# Patient Record
Sex: Male | Born: 1945 | Race: White | Hispanic: No | Marital: Single | Smoking: Current every day smoker
Health system: Southern US, Community
[De-identification: ages and names within clinical notes are randomized; demographics above are authoritative.]

## PROBLEM LIST (undated history)

## (undated) DIAGNOSIS — F419 Anxiety disorder, unspecified: Secondary | ICD-10-CM

## (undated) DIAGNOSIS — F1010 Alcohol abuse, uncomplicated: Secondary | ICD-10-CM

## (undated) DIAGNOSIS — R569 Unspecified convulsions: Secondary | ICD-10-CM

## (undated) DIAGNOSIS — C801 Malignant (primary) neoplasm, unspecified: Secondary | ICD-10-CM

## (undated) DIAGNOSIS — H269 Unspecified cataract: Secondary | ICD-10-CM

## (undated) DIAGNOSIS — N400 Enlarged prostate without lower urinary tract symptoms: Secondary | ICD-10-CM

## (undated) HISTORY — DX: Anxiety disorder, unspecified: F41.9

## (undated) HISTORY — DX: Unspecified cataract: H26.9

---

## 2005-09-17 ENCOUNTER — Ambulatory Visit: Payer: Self-pay | Admitting: *Deleted

## 2005-09-17 ENCOUNTER — Inpatient Hospital Stay (HOSPITAL_COMMUNITY): Admission: EM | Admit: 2005-09-17 | Discharge: 2005-09-21 | Payer: Self-pay | Admitting: *Deleted

## 2006-03-18 ENCOUNTER — Emergency Department (HOSPITAL_COMMUNITY): Admission: EM | Admit: 2006-03-18 | Discharge: 2006-03-18 | Payer: Self-pay | Admitting: Emergency Medicine

## 2006-04-06 ENCOUNTER — Ambulatory Visit (HOSPITAL_COMMUNITY): Payer: Self-pay | Admitting: Psychiatry

## 2006-05-07 ENCOUNTER — Inpatient Hospital Stay (HOSPITAL_COMMUNITY): Admission: EM | Admit: 2006-05-07 | Discharge: 2006-05-10 | Payer: Self-pay | Admitting: Emergency Medicine

## 2006-05-07 ENCOUNTER — Ambulatory Visit: Payer: Self-pay | Admitting: Internal Medicine

## 2006-05-18 ENCOUNTER — Ambulatory Visit (HOSPITAL_COMMUNITY): Payer: Self-pay | Admitting: Psychiatry

## 2006-06-02 ENCOUNTER — Ambulatory Visit: Payer: Self-pay | Admitting: Internal Medicine

## 2006-06-02 DIAGNOSIS — F17200 Nicotine dependence, unspecified, uncomplicated: Secondary | ICD-10-CM | POA: Insufficient documentation

## 2006-06-02 DIAGNOSIS — H5316 Psychophysical visual disturbances: Secondary | ICD-10-CM | POA: Insufficient documentation

## 2006-06-02 DIAGNOSIS — F411 Generalized anxiety disorder: Secondary | ICD-10-CM | POA: Insufficient documentation

## 2006-06-02 DIAGNOSIS — G478 Other sleep disorders: Secondary | ICD-10-CM | POA: Insufficient documentation

## 2006-06-02 DIAGNOSIS — F1011 Alcohol abuse, in remission: Secondary | ICD-10-CM | POA: Insufficient documentation

## 2006-06-19 ENCOUNTER — Ambulatory Visit (HOSPITAL_BASED_OUTPATIENT_CLINIC_OR_DEPARTMENT_OTHER): Admission: RE | Admit: 2006-06-19 | Discharge: 2006-06-19 | Payer: Self-pay | Admitting: Internal Medicine

## 2006-06-19 ENCOUNTER — Encounter: Payer: Self-pay | Admitting: Internal Medicine

## 2006-06-26 ENCOUNTER — Ambulatory Visit: Payer: Self-pay | Admitting: Internal Medicine

## 2010-04-21 NOTE — Assessment & Plan Note (Signed)
Summary: NEW HFU PER DR GOLDING/CFB   Vital Signs:  Patient Profile:   65 Years Old Male Height:     68 inches Weight:      147.9 pounds Temp:     98.4 degrees F oral Pulse rate:   88 / minute BP sitting:   136 / 80  (right arm) Cuff size:   regular  Pt. in pain?   no  Vitals Entered By: Theotis Barrio (June 02, 2006 2:22 PM)              Is Patient Diabetic? No Nutritional Status Normal  Does patient need assistance? Functional Status Self care Ambulation Normal   Visit Type:  Hospital FU  Chief Complaint:  hospital followup/.  History of Present Illness: 65 year old man presents to office today for hospital follow-up. He has a PMH significant for alcoholism, in recovery for 7 months. He was admitted after possible witnessed seizure activity. His son described jerking movements and an unarousable state in the early morning hours prior to admission. In the hospital, he was not post-ictal, he had no additional seizure activity, and a normal EEG. He is seen by Dr. Elmon Kirschner at [facility]. He attends 12 step meetings. he has no complaints other than anxiety.He continues to smoke heavily.  Prior Medications: RISPERDAL 0.5 MG TABS (RISPERIDONE) Take 1 tablet by mouth once a day at bedtime   Past Medical History:    Hx ETOH Detox    Hx Homelessness    Risk Factors:  Tobacco use:  current    Year started:  over 20 years ago    Cigarettes:  Yes -- 2 pack(s) per day Alcohol use:  no Exercise:  no Seatbelt use:  100 %    Physical Exam  General:     alert, well-developed, and well-nourished.   Head:     no abnormalities observed.   Lungs:     normal respiratory effort and normal breath sounds.   Heart:     normal rate, regular rhythm, no murmur, no gallop, and no rub.   Abdomen:     soft and non-tender.   Extremities:     No clubbing, cyanosis, edema, or deformity noted. Neurologic:     alert & oriented X3, cranial nerves II-XII intact, strength normal in all  extremities, and gait normal.   Psych:     good eye contact, not depressed appearing, but moderately anxious.      Impression & Recommendations:  Problem # 1:  PARASOMNIA (ICD-780.59) Mr. Gaulin unusual symptoms mimicing seizure activity occurred in the early morning hour when he was in between sleep and wake. This is suspicious for a parasomnia or abmormal neuromuscular activity associated with the sleep wake cycle. I will refer him for a sleep study and see him back in the office in one month to review results.    Future Orders: T-CBC No Diff ([id number]) ... 63/03/2006 T-Lipid Profile [id number]) ... 63/03/2006 T-General Health Panel (CBCD, CMP, TSH) ([id number]) ... 63/03/2006 Sleep Disorder Referral (Sleep Disorder) ... 06/19/2006   Problem # 2:  ANXIETY DISORDER, GENERALIZED (ICD-300.02) Mr. Rollo is currently on Risperadol prescribed by his psyciatrist at [facility] for a prior visual hallucination- it is unclear if this was related to his detox process or represents an underlying psychosis. I will make no changes to his medication regimen today. I will consult with Dr. Elmon Kirschner prior to Mr. Ledyard next visit regarding his anxiety disorder. He may benefit from SSRI therapy. I also question  a mixed disorder including depression by history. Mr. Overbeck has an excellent support system at home with his son and daughter in law helping him with his recovery. I met with his daughter-in-law today as well, who expressed understanding of our future care plan.   F/U in 1 month.  Future Orders: T-CBC No Diff ([id number]) ... 63/03/2006 T-Lipid Profile [id number]) ... 63/03/2006 T-General Health Panel (CBCD, CMP, TSH) ([id number]) ... 63/03/2006   Problem # 3:  TOBACCO ABUSE (ICD-305.1) Mr. Idrovo is pre-contemplation. He feels at this point hs sobriety is most important. He also thinks he relies on smoking to help with his anxiety. He does express understanding of the negative health effects  and would like to begin planning for cessation in the near future. Provided encouragement and support today.  Medications Added to Medication List This Visit: 1)  Risperdal 0.5 Mg Tabs (Risperidone) .... Take 1 tablet by mouth once a day at bedtime   Patient Instructions: 1)  F/U in 3-4 months 2)  Please return for lab work one (1) week before your next appointment.  3)  Stop Smoking Tips: Choose a Quit date. Cut down before the Quit date. decide what you will do as a substitute when you feel the urge to smoke(gum,toothpick,exercise). 4)  Take an Aspirin every day.

## 2010-04-21 NOTE — Consult Note (Signed)
Summary: Sleep Study-Dr. Maple Hudson  Sleep Study-Dr. Maple Hudson   Imported By: Dorice Lamas 08/02/2006 13:47:22  _____________________________________________________________________  External Attachment:    Type:   Image     Comment:   External Document

## 2018-03-22 HISTORY — PX: CATARACT EXTRACTION, BILATERAL: SHX1313

## 2019-06-30 ENCOUNTER — Emergency Department (HOSPITAL_COMMUNITY)
Admission: EM | Admit: 2019-06-30 | Discharge: 2019-06-30 | Disposition: A | Discharge status: Home / Self Care | Payer: Medicare Other | Attending: Emergency Medicine | Admitting: Emergency Medicine

## 2019-06-30 ENCOUNTER — Other Ambulatory Visit: Payer: Self-pay

## 2019-06-30 ENCOUNTER — Encounter (HOSPITAL_COMMUNITY): Payer: Self-pay | Admitting: Emergency Medicine

## 2019-06-30 ENCOUNTER — Emergency Department (HOSPITAL_COMMUNITY): Payer: Medicare Other

## 2019-06-30 DIAGNOSIS — D376 Neoplasm of uncertain behavior of liver, gallbladder and bile ducts: Secondary | ICD-10-CM | POA: Insufficient documentation

## 2019-06-30 DIAGNOSIS — R339 Retention of urine, unspecified: Secondary | ICD-10-CM | POA: Diagnosis not present

## 2019-06-30 DIAGNOSIS — F17200 Nicotine dependence, unspecified, uncomplicated: Secondary | ICD-10-CM | POA: Diagnosis not present

## 2019-06-30 DIAGNOSIS — K5900 Constipation, unspecified: Secondary | ICD-10-CM | POA: Diagnosis present

## 2019-06-30 DIAGNOSIS — R160 Hepatomegaly, not elsewhere classified: Secondary | ICD-10-CM

## 2019-06-30 DIAGNOSIS — E876 Hypokalemia: Secondary | ICD-10-CM | POA: Diagnosis not present

## 2019-06-30 HISTORY — DX: Unspecified convulsions: R56.9

## 2019-06-30 LAB — URINALYSIS, ROUTINE W REFLEX MICROSCOPIC
Bilirubin Urine: NEGATIVE
Glucose, UA: NEGATIVE mg/dL
Hgb urine dipstick: NEGATIVE
Ketones, ur: NEGATIVE mg/dL
Leukocytes,Ua: NEGATIVE
Nitrite: NEGATIVE
Protein, ur: NEGATIVE mg/dL
Specific Gravity, Urine: 1.012 (ref 1.005–1.030)
pH: 6 (ref 5.0–8.0)

## 2019-06-30 LAB — COMPREHENSIVE METABOLIC PANEL
ALT: 15 U/L (ref 0–44)
AST: 19 U/L (ref 15–41)
Albumin: 3.5 g/dL (ref 3.5–5.0)
Alkaline Phosphatase: 42 U/L (ref 38–126)
Anion gap: 11 (ref 5–15)
BUN: 13 mg/dL (ref 8–23)
CO2: 27 mmol/L (ref 22–32)
Calcium: 9.1 mg/dL (ref 8.9–10.3)
Chloride: 104 mmol/L (ref 98–111)
Creatinine, Ser: 1.15 mg/dL (ref 0.61–1.24)
GFR calc Af Amer: >60 mL/min (ref >60)
GFR calc non Af Amer: >60 mL/min (ref >60)
Glucose, Bld: 102 mg/dL — ABNORMAL HIGH (ref 70–99)
Potassium: 2.6 mmol/L — CL (ref 3.5–5.1)
Sodium: 142 mmol/L (ref 135–145)
Total Bilirubin: 0.9 mg/dL (ref 0.3–1.2)
Total Protein: 6.2 g/dL — ABNORMAL LOW (ref 6.5–8.1)

## 2019-06-30 LAB — CBC
HCT: 39.7 % (ref 39.0–52.0)
Hemoglobin: 13.3 g/dL (ref 13.0–17.0)
MCH: 30.7 pg (ref 26.0–34.0)
MCHC: 33.5 g/dL (ref 30.0–36.0)
MCV: 91.7 fL (ref 80.0–100.0)
Platelets: 368 10*3/uL (ref 150–400)
RBC: 4.33 MIL/uL (ref 4.22–5.81)
RDW: 13.8 % (ref 11.5–15.5)
WBC: 11.6 10*3/uL — ABNORMAL HIGH (ref 4.0–10.5)
nRBC: 0 % (ref 0.0–0.2)

## 2019-06-30 LAB — LIPASE, BLOOD: Lipase: 22 U/L (ref 11–51)

## 2019-06-30 MED ORDER — SODIUM CHLORIDE 0.9% FLUSH: 3.0000 mL | Freq: Once | INTRAVENOUS | Status: DC

## 2019-06-30 MED ORDER — SODIUM CHLORIDE 0.9 % IV SOLN
INTRAVENOUS | Status: DC
Start: 2019-06-30 — End: 2019-07-01

## 2019-06-30 MED ORDER — IOHEXOL 300 MG/ML  SOLN
100.0000 mL | Freq: Once | INTRAMUSCULAR | Status: AC | PRN
  Administered 2019-06-30: 20:00:00 100 mL via INTRAVENOUS

## 2019-06-30 MED ORDER — POTASSIUM CHLORIDE CRYS ER 20 MEQ PO TBCR
60.0000 meq | EXTENDED_RELEASE_TABLET | Freq: Once | ORAL | Status: AC
  Administered 2019-06-30: 19:00:00 60 meq via ORAL
  Filled 2019-06-30: qty 3

## 2019-06-30 MED ORDER — MORPHINE SULFATE (PF) 4 MG/ML IV SOLN
4.0000 mg | Freq: Once | INTRAVENOUS | Status: DC
  Filled 2019-06-30: qty 1

## 2019-06-30 MED ORDER — POTASSIUM CHLORIDE 10 MEQ/100ML IV SOLN
10.0000 meq | Freq: Once | INTRAVENOUS | Status: AC
  Administered 2019-06-30: 10 meq via INTRAVENOUS
  Filled 2019-06-30: qty 100

## 2019-06-30 NOTE — ED Notes (Signed)
Potassium 2.6 

## 2019-06-30 NOTE — ED Triage Notes (Signed)
C/o constipation x 1 month with rectal pain and urinary incontinence.

## 2019-06-30 NOTE — ED Provider Notes (Signed)
[facility] EMERGENCY DEPARTMENT Provider Note   CSN: [id number] Arrival date & time: 06/30/19  1735     History Chief Complaint  Patient presents with  . Constipation    Joyner Folk is a 74 y.o. male.  74 year old male presents with 1 month history of constipation.  Has been using over-the-counter medications with limited relief.  States that he has had some urinary incontinence but denies any retention.  No back or abdominal discomfort.  No fever or chills.  No emesis noted.  Feels as if he has a mass in his rectum.  Nothing makes his symptoms better        Past Medical History:  Diagnosis Date  . Seizures [facility])     Patient Active Problem List   Diagnosis Date Noted  . ANXIETY DISORDER, GENERALIZED 06/02/2006  . ABUSE, ALCOHOL, IN REMISSION 06/02/2006  . TOBACCO ABUSE 06/02/2006  . VISUAL HALLUCINATION 06/02/2006  . PARASOMNIA 06/02/2006    History reviewed. No pertinent surgical history.     No family history on file.  Social History   Tobacco Use  . Smoking status: Current Every Day Smoker  . Smokeless tobacco: Never Used  Substance Use Topics  . Alcohol use: Not Currently  . Drug use: Never    Home Medications Prior to Admission medications   Not on File    Allergies    Patient has no allergy information on record.  Review of Systems   Review of Systems  All other systems reviewed and are negative.   Physical Exam Updated Vital Signs BP (!) 146/93 (BP Location: Left Arm)   Pulse (!) 102   Temp 98.7 F (37.1 C) (Oral)   Resp 16   Ht 1.727 m (5\' 8" )   SpO2 94%   Physical Exam Vitals and nursing note reviewed.  Constitutional:      General: He is not in acute distress.    Appearance: Normal appearance. He is well-developed. He is not toxic-appearing.  HENT:     Head: Normocephalic and atraumatic.  Eyes:     General: Lids are normal.     Conjunctiva/sclera: Conjunctivae normal.     Pupils: Pupils are equal,  round, and reactive to light.  Neck:     Thyroid: No thyroid mass.     Trachea: No tracheal deviation.  Cardiovascular:     Rate and Rhythm: Normal rate and regular rhythm.     Heart sounds: Normal heart sounds. No murmur. No gallop.   Pulmonary:     Effort: Pulmonary effort is normal. No respiratory distress.     Breath sounds: Normal breath sounds. No stridor. No decreased breath sounds, wheezing, rhonchi or rales.  Abdominal:     General: Bowel sounds are normal. There is no distension.     Palpations: Abdomen is soft.     Tenderness: There is no abdominal tenderness. There is no rebound.  Genitourinary:    Comments: Stool ball noted in rectal vault Musculoskeletal:        General: No tenderness. Normal range of motion.     Cervical back: Normal range of motion and neck supple.  Skin:    General: Skin is warm and dry.     Findings: No abrasion or rash.  Neurological:     Mental Status: He is alert and oriented to person, place, and time.     GCS: GCS eye subscore is 4. GCS verbal subscore is 5. GCS motor subscore is 6.  Cranial Nerves: No cranial nerve deficit.     Sensory: No sensory deficit.  Psychiatric:        Speech: Speech normal.        Behavior: Behavior normal.     ED Results / Procedures / Treatments   Labs (all labs ordered are listed, but only abnormal results are displayed) Labs Reviewed  CBC - Abnormal; Notable for the following components:      Result Value   WBC 11.6 (*)    All other components within normal limits  LIPASE, BLOOD  COMPREHENSIVE METABOLIC PANEL  URINALYSIS, ROUTINE W REFLEX MICROSCOPIC    EKG None  Radiology No results found.  Procedures Procedures (including critical care time)  Medications Ordered in ED Medications  sodium chloride flush (NS) 0.9 % injection 3 mL (has no administration in time range)    ED Course  I have reviewed the triage vital signs and the nursing notes.  Pertinent labs & imaging results that  were available during my care of the patient were reviewed by me and considered in my medical decision making (see chart for details).    MDM Rules/Calculators/A&P                      Patient disimpacted manually here.  Good stool return.  Found to be hypokalemic and likely from his diarrhea and his potassium was replenished oral as well as IV.  Urinalysis negative for infection.  Renal function normal.  Abdominal CT results noted and reviewed with the patient.  Will have nursing place Foley catheter and give patient referral to urology.  Liver mass discussed in detail with patient and his daughter.  Will refer to GI for MRCP.  Patient's primary care doctor is in [place] at [facility] and I have instructed them to call their physician on Monday to schedule a follow-up visit.  Patient also need to have a repeat potassium next week.  Return precautions given   CRITICAL CARE Performed by: Leota Jacobsen Total critical care time: 40 minutes Critical care time was exclusive of separately billable procedures and treating other patients. Critical care was necessary to treat or prevent imminent or life-threatening deterioration. Critical care was time spent personally by me on the following activities: development of treatment plan with patient and/or surrogate as well as nursing, discussions with consultants, evaluation of patient's response to treatment, examination of patient, obtaining history from patient or surrogate, ordering and performing treatments and interventions, ordering and review of laboratory studies, ordering and review of radiographic studies, pulse oximetry and re-evaluation of patient's condition.  Final Clinical Impression(s) / ED Diagnoses Final diagnoses:  None    Rx / DC Orders ED Discharge Orders    None       Lacretia Leigh, MD 06/30/19 2026

## 2019-06-30 NOTE — Discharge Instructions (Addendum)
You have a mass on your liver which is highly suspicious for cancer.  Call your primary care doctor to schedule a follow-up visit with this at [facility] or call the gastroenterologist that I referred you to.  You have also been referred to urologist due to urinary retention.  You may call the one that I referred you to or also have your doctor coordinate a visit at [facility].  Use a fleets enema as directed.  Return here for vomiting.  Your potassium was low here and needs to be repeated next week.

## 2019-06-30 NOTE — ED Notes (Signed)
Pink tinged bloody urine noted in foley catheter line, blood in catheter bag amber.  Dr. Karle Starch made aware.  Advised pt and daughter to check patency of foley frequently.  If appears to be blocked or large clots return to ED.

## 2019-08-17 ENCOUNTER — Inpatient Hospital Stay (HOSPITAL_COMMUNITY)
Admission: EM | Admit: 2019-08-17 | Discharge: 2019-08-22 | DRG: 871 | Disposition: A | Discharge status: Skilled Nursing Facility | Payer: Medicare Other | Attending: Internal Medicine | Admitting: Internal Medicine

## 2019-08-17 ENCOUNTER — Emergency Department (HOSPITAL_COMMUNITY): Payer: Medicare Other

## 2019-08-17 ENCOUNTER — Encounter (HOSPITAL_COMMUNITY): Payer: Self-pay | Admitting: Obstetrics and Gynecology

## 2019-08-17 ENCOUNTER — Other Ambulatory Visit: Payer: Self-pay

## 2019-08-17 DIAGNOSIS — N3289 Other specified disorders of bladder: Secondary | ICD-10-CM | POA: Diagnosis present

## 2019-08-17 DIAGNOSIS — R5381 Other malaise: Secondary | ICD-10-CM | POA: Diagnosis present

## 2019-08-17 DIAGNOSIS — D649 Anemia, unspecified: Secondary | ICD-10-CM | POA: Diagnosis present

## 2019-08-17 DIAGNOSIS — R338 Other retention of urine: Secondary | ICD-10-CM

## 2019-08-17 DIAGNOSIS — Z9119 Patient's noncompliance with other medical treatment and regimen: Secondary | ICD-10-CM | POA: Diagnosis not present

## 2019-08-17 DIAGNOSIS — F17200 Nicotine dependence, unspecified, uncomplicated: Secondary | ICD-10-CM | POA: Diagnosis present

## 2019-08-17 DIAGNOSIS — R339 Retention of urine, unspecified: Secondary | ICD-10-CM | POA: Diagnosis not present

## 2019-08-17 DIAGNOSIS — R932 Abnormal findings on diagnostic imaging of liver and biliary tract: Secondary | ICD-10-CM | POA: Diagnosis not present

## 2019-08-17 DIAGNOSIS — N179 Acute kidney failure, unspecified: Secondary | ICD-10-CM

## 2019-08-17 DIAGNOSIS — R0682 Tachypnea, not elsewhere classified: Secondary | ICD-10-CM | POA: Diagnosis present

## 2019-08-17 DIAGNOSIS — N136 Pyonephrosis: Secondary | ICD-10-CM | POA: Diagnosis present

## 2019-08-17 DIAGNOSIS — R195 Other fecal abnormalities: Secondary | ICD-10-CM | POA: Diagnosis present

## 2019-08-17 DIAGNOSIS — B961 Klebsiella pneumoniae [K. pneumoniae] as the cause of diseases classified elsewhere: Secondary | ICD-10-CM | POA: Diagnosis present

## 2019-08-17 DIAGNOSIS — G40909 Epilepsy, unspecified, not intractable, without status epilepticus: Secondary | ICD-10-CM | POA: Diagnosis present

## 2019-08-17 DIAGNOSIS — Z87440 Personal history of urinary (tract) infections: Secondary | ICD-10-CM

## 2019-08-17 DIAGNOSIS — J90 Pleural effusion, not elsewhere classified: Secondary | ICD-10-CM | POA: Diagnosis present

## 2019-08-17 DIAGNOSIS — Z79899 Other long term (current) drug therapy: Secondary | ICD-10-CM

## 2019-08-17 DIAGNOSIS — R160 Hepatomegaly, not elsewhere classified: Secondary | ICD-10-CM

## 2019-08-17 DIAGNOSIS — Z20822 Contact with and (suspected) exposure to covid-19: Secondary | ICD-10-CM | POA: Diagnosis present

## 2019-08-17 DIAGNOSIS — R9431 Abnormal electrocardiogram [ECG] [EKG]: Secondary | ICD-10-CM | POA: Diagnosis present

## 2019-08-17 DIAGNOSIS — C221 Intrahepatic bile duct carcinoma: Secondary | ICD-10-CM | POA: Diagnosis present

## 2019-08-17 DIAGNOSIS — I959 Hypotension, unspecified: Secondary | ICD-10-CM | POA: Diagnosis present

## 2019-08-17 DIAGNOSIS — N390 Urinary tract infection, site not specified: Secondary | ICD-10-CM

## 2019-08-17 DIAGNOSIS — Z791 Long term (current) use of non-steroidal anti-inflammatories (NSAID): Secondary | ICD-10-CM | POA: Diagnosis not present

## 2019-08-17 DIAGNOSIS — G9341 Metabolic encephalopathy: Secondary | ICD-10-CM | POA: Diagnosis present

## 2019-08-17 DIAGNOSIS — R188 Other ascites: Secondary | ICD-10-CM | POA: Diagnosis present

## 2019-08-17 DIAGNOSIS — A419 Sepsis, unspecified organism: Secondary | ICD-10-CM | POA: Diagnosis present

## 2019-08-17 DIAGNOSIS — Z602 Problems related to living alone: Secondary | ICD-10-CM | POA: Diagnosis not present

## 2019-08-17 LAB — MAGNESIUM: Magnesium: 1.9 mg/dL (ref 1.7–2.4)

## 2019-08-17 LAB — COMPREHENSIVE METABOLIC PANEL
ALT: 13 U/L (ref 0–44)
AST: 11 U/L — ABNORMAL LOW (ref 15–41)
Albumin: 2.1 g/dL — ABNORMAL LOW (ref 3.5–5.0)
Alkaline Phosphatase: 61 U/L (ref 38–126)
Anion gap: 9 (ref 5–15)
BUN: 45 mg/dL — ABNORMAL HIGH (ref 8–23)
CO2: 19 mmol/L — ABNORMAL LOW (ref 22–32)
Calcium: 7.5 mg/dL — ABNORMAL LOW (ref 8.9–10.3)
Chloride: 108 mmol/L (ref 98–111)
Creatinine, Ser: 2.44 mg/dL — ABNORMAL HIGH (ref 0.61–1.24)
GFR calc Af Amer: 30 mL/min — ABNORMAL LOW (ref >60)
GFR calc non Af Amer: 25 mL/min — ABNORMAL LOW (ref >60)
Glucose, Bld: 123 mg/dL — ABNORMAL HIGH (ref 70–99)
Potassium: 3.9 mmol/L (ref 3.5–5.1)
Sodium: 136 mmol/L (ref 135–145)
Total Bilirubin: 0.3 mg/dL (ref 0.3–1.2)
Total Protein: 5.7 g/dL — ABNORMAL LOW (ref 6.5–8.1)

## 2019-08-17 LAB — CBC WITH DIFFERENTIAL/PLATELET
Abs Immature Granulocytes: 0.17 10*3/uL — ABNORMAL HIGH (ref 0.00–0.07)
Basophils Absolute: 0.1 10*3/uL (ref 0.0–0.1)
Basophils Relative: 0 %
Eosinophils Absolute: 0.1 10*3/uL (ref 0.0–0.5)
Eosinophils Relative: 0 %
HCT: 25.6 % — ABNORMAL LOW (ref 39.0–52.0)
Hemoglobin: 8.2 g/dL — ABNORMAL LOW (ref 13.0–17.0)
Immature Granulocytes: 1 %
Lymphocytes Relative: 8 %
Lymphs Abs: 1.6 10*3/uL (ref 0.7–4.0)
MCH: 29.3 pg (ref 26.0–34.0)
MCHC: 32 g/dL (ref 30.0–36.0)
MCV: 91.4 fL (ref 80.0–100.0)
Monocytes Absolute: 1.4 10*3/uL — ABNORMAL HIGH (ref 0.1–1.0)
Monocytes Relative: 7 %
Neutro Abs: 16.9 10*3/uL — ABNORMAL HIGH (ref 1.7–7.7)
Neutrophils Relative %: 84 %
Platelets: 761 10*3/uL — ABNORMAL HIGH (ref 150–400)
RBC: 2.8 MIL/uL — ABNORMAL LOW (ref 4.22–5.81)
RDW: 15.8 % — ABNORMAL HIGH (ref 11.5–15.5)
WBC: 20.3 10*3/uL — ABNORMAL HIGH (ref 4.0–10.5)
nRBC: 0 % (ref 0.0–0.2)

## 2019-08-17 LAB — URINALYSIS, MICROSCOPIC (REFLEX)
RBC / HPF: NONE SEEN RBC/hpf (ref 0–5)
Squamous Epithelial / HPF: NONE SEEN (ref 0–5)
WBC, UA: >50 WBC/hpf (ref 0–5)

## 2019-08-17 LAB — SARS CORONAVIRUS 2 BY RT PCR (HOSPITAL ORDER, PERFORMED IN ~~LOC~~ HOSPITAL LAB): SARS Coronavirus 2: NEGATIVE

## 2019-08-17 LAB — URINALYSIS, ROUTINE W REFLEX MICROSCOPIC
Specific Gravity, Urine: 1.02 (ref 1.005–1.030)
pH: 6 (ref 5.0–8.0)

## 2019-08-17 LAB — APTT: aPTT: 33 seconds (ref 24–36)

## 2019-08-17 LAB — LACTIC ACID, PLASMA: Lactic Acid, Venous: 1.3 mmol/L (ref 0.5–1.9)

## 2019-08-17 LAB — POC OCCULT BLOOD, ED: Fecal Occult Bld: NEGATIVE

## 2019-08-17 LAB — PROTIME-INR
INR: 1.3 — ABNORMAL HIGH (ref 0.8–1.2)
Prothrombin Time: 16.1 seconds — ABNORMAL HIGH (ref 11.4–15.2)

## 2019-08-17 LAB — TSH: TSH: 1.92 u[IU]/mL (ref 0.350–4.500)

## 2019-08-17 MED ORDER — SODIUM CHLORIDE 0.9 % IV SOLN
1.0000 g | Freq: Once | INTRAVENOUS | Status: AC
  Administered 2019-08-17: 1 g via INTRAVENOUS
  Filled 2019-08-17: qty 10

## 2019-08-17 MED ORDER — POLYETHYLENE GLYCOL 3350 17 G PO PACK: 17.0000 g | PACK | Freq: Every day | ORAL | Status: DC | PRN

## 2019-08-17 MED ORDER — OXYCODONE HCL 5 MG PO TABS: 5.0000 mg | ORAL_TABLET | ORAL | Status: DC | PRN

## 2019-08-17 MED ORDER — MAGNESIUM SULFATE 2 GM/50ML IV SOLN: 2.0000 g | Freq: Once | INTRAVENOUS | Status: DC

## 2019-08-17 MED ORDER — SODIUM CHLORIDE 0.9 % IV BOLUS
30.0000 mL/kg | Freq: Once | INTRAVENOUS | Status: AC
  Administered 2019-08-17: 2028 mL via INTRAVENOUS

## 2019-08-17 MED ORDER — SODIUM CHLORIDE 0.9 % IV SOLN: INTRAVENOUS | Status: DC

## 2019-08-17 MED ORDER — SODIUM CHLORIDE 0.9 % IV SOLN
2.0000 g | INTRAVENOUS | Status: DC
  Administered 2019-08-17 – 2019-08-18 (×2): 2 g via INTRAVENOUS
  Filled 2019-08-17 (×3): qty 2

## 2019-08-17 MED ORDER — TAMSULOSIN HCL 0.4 MG PO CAPS
0.4000 mg | ORAL_CAPSULE | Freq: Every day | ORAL | Status: DC
  Administered 2019-08-17 – 2019-08-22 (×6): 0.4 mg via ORAL
  Filled 2019-08-17 (×6): qty 1

## 2019-08-17 MED ORDER — MAGNESIUM SULFATE IN D5W 1-5 GM/100ML-% IV SOLN
1.0000 g | Freq: Once | INTRAVENOUS | Status: AC
  Administered 2019-08-17: 1 g via INTRAVENOUS
  Filled 2019-08-17: qty 100

## 2019-08-17 MED ORDER — OXCARBAZEPINE 300 MG/5ML PO SUSP
600.0000 mg | Freq: Every day | ORAL | Status: DC
  Administered 2019-08-17 – 2019-08-21 (×5): 600 mg via ORAL
  Filled 2019-08-17 (×6): qty 10

## 2019-08-17 MED ORDER — ACETAMINOPHEN 650 MG RE SUPP: 650.0000 mg | Freq: Four times a day (QID) | RECTAL | Status: DC | PRN

## 2019-08-17 MED ORDER — OXCARBAZEPINE 300 MG/5ML PO SUSP
300.0000 mg | Freq: Every day | ORAL | Status: DC
  Administered 2019-08-18 – 2019-08-22 (×5): 300 mg via ORAL
  Filled 2019-08-17 (×5): qty 5

## 2019-08-17 MED ORDER — ONDANSETRON HCL 4 MG/2ML IJ SOLN: 4.0000 mg | Freq: Four times a day (QID) | INTRAMUSCULAR | Status: DC | PRN

## 2019-08-17 MED ORDER — HEPARIN SODIUM (PORCINE) 5000 UNIT/ML IJ SOLN
5000.0000 [IU] | Freq: Three times a day (TID) | INTRAMUSCULAR | Status: DC
  Administered 2019-08-17: 5000 [IU] via SUBCUTANEOUS
  Filled 2019-08-17: qty 1

## 2019-08-17 MED ORDER — ACETAMINOPHEN 325 MG PO TABS: 650.0000 mg | ORAL_TABLET | Freq: Four times a day (QID) | ORAL | Status: DC | PRN

## 2019-08-17 MED ORDER — ONDANSETRON HCL 4 MG PO TABS: 4.0000 mg | ORAL_TABLET | Freq: Four times a day (QID) | ORAL | Status: DC | PRN

## 2019-08-17 NOTE — ED Notes (Signed)
ED TO INPATIENT HANDOFF REPORT  ED Nurse Name and Phone #: jon wled   S Name/Age/Gender Mason Blackburn 74 y.o. male Room/Bed: WA01/WA01  Code Status   Code Status: Full Code  Home/SNF/Other Home Patient oriented to: self and situation Is this baseline? Yes   Triage Complete: Triage complete  Chief Complaint Sepsis [facility]) [A41.9]  Triage Note Pt BIB EMS from UC. Pt reports weakness and UTI symptoms. Pt had a foley catheter that has since been removed. A&O x4. Pale and diaphoretic.  64/30 Low 90s SpO2 HR 80-90  CBG 82 18G RAC 1044mL NS    Allergies No Known Allergies  Level of Care/Admitting Diagnosis ED Disposition    ED Disposition Condition Comment   Admit  Hospital Area: [facility] [id number]  Level of Care: Telemetry [5]  Admit to tele based on following criteria: Monitor QTC interval  Admit to tele based on following criteria: Monitor for Ischemic changes  May admit patient to [facility] or [facility] if equivalent level of care is available:: Yes  Covid Evaluation: Confirmed COVID Negative  [facility]: [id number]  Admitting Physician: Guilford Shi [id number]  Attending Physician: Guilford Shi [id number]  Estimated length of stay: 3 - 4 days  Certification:: I certify this patient will need inpatient services for at least 2 midnights       B Medical/Surgery History Past Medical History:  Diagnosis Date  . Seizures ([place])    History reviewed. No pertinent surgical history.   A IV Location/Drains/Wounds Patient Lines/Drains/Airways Status   Active Line/Drains/Airways    Name:   Placement date:   Placement time:   Site:   Days:   Peripheral IV 08/17/19 Left Forearm   08/17/19    1333    Forearm   less than 1   Peripheral IV 08/17/19 Right Antecubital   08/17/19    --    Antecubital   less than 1          Intake/Output Last 24 hours  Intake/Output Summary (Last 24 hours) at 08/17/2019 1924 Last data filed  at 08/17/2019 [id number] Gross per 24 hour  Intake 2328 ml  Output --  Net 2328 ml    Labs/Imaging Results for orders placed or performed during the hospital encounter of 08/17/19 (from the past 48 hour(s))  Lactic acid, plasma     Status: None   Collection Time: 08/17/19  1:23 PM  Result Value Ref Range   Lactic Acid, Venous 1.3 0.5 - 1.9 mmol/L    Comment: Performed at [facility], [place] [address] [place], [place] [address]  Urinalysis, Routine w reflex microscopic     Status: Abnormal   Collection Time: 08/17/19  1:23 PM  Result Value Ref Range   Color, Urine YELLOW YELLOW   APPearance TURBID (A) CLEAR   Specific Gravity, Urine 1.020 1.005 - 1.030   pH 6.0 5.0 - 8.0   Glucose, UA (A) NEGATIVE mg/dL    TEST NOT REPORTED DUE TO COLOR INTERFERENCE OF URINE PIGMENT   Hgb urine dipstick (A) NEGATIVE    TEST NOT REPORTED DUE TO COLOR INTERFERENCE OF URINE PIGMENT   Bilirubin Urine (A) NEGATIVE    TEST NOT REPORTED DUE TO COLOR INTERFERENCE OF URINE PIGMENT   Ketones, ur (A) NEGATIVE mg/dL    TEST NOT REPORTED DUE TO COLOR INTERFERENCE OF URINE PIGMENT   Protein, ur (A) NEGATIVE mg/dL    TEST NOT REPORTED DUE TO COLOR INTERFERENCE OF URINE PIGMENT  Nitrite (A) NEGATIVE    TEST NOT REPORTED DUE TO COLOR INTERFERENCE OF URINE PIGMENT   Leukocytes,Ua (A) NEGATIVE    TEST NOT REPORTED DUE TO COLOR INTERFERENCE OF URINE PIGMENT    Comment: Performed at [facility], [place] [address], [place], [place] [address]  Urinalysis, Microscopic (reflex)     Status: Abnormal   Collection Time: 08/17/19  1:23 PM  Result Value Ref Range   RBC / HPF NONE SEEN 0 - 5 RBC/hpf   WBC, UA >50 0 - 5 WBC/hpf   Bacteria, UA MANY (A) NONE SEEN   Squamous Epithelial / LPF NONE SEEN 0 - 5    Comment: Performed at [facility], [place] [address] [place], [place] [address]  SARS Coronavirus 2 by RT PCR (hospital order, performed in [facility] hospital lab)  Nasopharyngeal Nasopharyngeal Swab     Status: None   Collection Time: 08/17/19  1:28 PM   Specimen: Nasopharyngeal Swab  Result Value Ref Range   SARS Coronavirus 2 NEGATIVE NEGATIVE    Comment: (NOTE) SARS-CoV-2 target nucleic acids are NOT DETECTED. The SARS-CoV-2 RNA is generally detectable in upper and lower respiratory specimens during the acute phase of infection. The lowest concentration of SARS-CoV-2 viral copies this assay can detect is 250 copies / mL. A negative result does not preclude SARS-CoV-2 infection and should not be used as the sole basis for treatment or other patient management decisions.  A negative result may occur with improper specimen collection / handling, submission of specimen other than nasopharyngeal swab, presence of viral mutation(s) within the areas targeted by this assay, and inadequate number of viral copies (<250 copies / mL). A negative result must be combined with clinical observations, patient history, and epidemiological information. Fact Sheet for Patients:   StrictlyIdeas.no Fact Sheet for Healthcare Providers: BankingDealers.co.za This test is not yet approved or cleared  by the [place] FDA and has been authorized for detection and/or diagnosis of SARS-CoV-2 by FDA under an Emergency Use Authorization (EUA).  This EUA will remain in effect (meaning this test can be used) for the duration of the COVID-19 declaration under Section 564(b)(1) of the Act, 21 U.S.C. section 360bbb-3(b)(1), unless the authorization is terminated or revoked sooner. Performed at [facility], [place] [address], [place], [place] [address]   Comprehensive metabolic panel     Status: Abnormal   Collection Time: 08/17/19  1:39 PM  Result Value Ref Range   Sodium 136 135 - 145 mmol/L   Potassium 3.9 3.5 - 5.1 mmol/L   Chloride 108 98 - 111 mmol/L   CO2 19 (L) 22 - 32 mmol/L   Glucose, Bld 123 (H)  70 - 99 mg/dL    Comment: Glucose reference range applies only to samples taken after fasting for at least 8 hours.   BUN 45 (H) 8 - 23 mg/dL   Creatinine, Ser 2.44 (H) 0.61 - 1.24 mg/dL   Calcium 7.5 (L) 8.9 - 10.3 mg/dL   Total Protein 5.7 (L) 6.5 - 8.1 g/dL   Albumin 2.1 (L) 3.5 - 5.0 g/dL   AST 11 (L) 15 - 41 U/L   ALT 13 0 - 44 U/L   Alkaline Phosphatase 61 38 - 126 U/L   Total Bilirubin 0.3 0.3 - 1.2 mg/dL   GFR calc non Af Amer 25 (L) >60 mL/min   GFR calc Af Amer 30 (L) >60 mL/min   Anion gap 9 5 - 15  Comment: Performed at [facility], [place] [address], [place], [place] [address]  CBC with Differential     Status: Abnormal   Collection Time: 08/17/19  1:39 PM  Result Value Ref Range   WBC 20.3 (H) 4.0 - 10.5 K/uL   RBC 2.80 (L) 4.22 - 5.81 MIL/uL   Hemoglobin 8.2 (L) 13.0 - 17.0 g/dL   HCT 25.6 (L) 39.0 - 52.0 %   MCV 91.4 80.0 - 100.0 fL   MCH 29.3 26.0 - 34.0 pg   MCHC 32.0 30.0 - 36.0 g/dL   RDW 15.8 (H) 11.5 - 15.5 %   Platelets 761 (H) 150 - 400 K/uL   nRBC 0.0 0.0 - 0.2 %   Neutrophils Relative % 84 %   Neutro Abs 16.9 (H) 1.7 - 7.7 K/uL   Lymphocytes Relative 8 %   Lymphs Abs 1.6 0.7 - 4.0 K/uL   Monocytes Relative 7 %   Monocytes Absolute 1.4 (H) 0.1 - 1.0 K/uL   Eosinophils Relative 0 %   Eosinophils Absolute 0.1 0.0 - 0.5 K/uL   Basophils Relative 0 %   Basophils Absolute 0.1 0.0 - 0.1 K/uL   Immature Granulocytes 1 %   Abs Immature Granulocytes 0.17 (H) 0.00 - 0.07 K/uL    Comment: Performed at [facility], [place] [address], [place], [place] [address]  Protime-INR     Status: Abnormal   Collection Time: 08/17/19  1:39 PM  Result Value Ref Range   Prothrombin Time 16.1 (H) 11.4 - 15.2 seconds   INR 1.3 (H) 0.8 - 1.2    Comment: (NOTE) INR goal varies based on device and disease states. Performed at [facility], [place] [address], [place], [place] [address]   APTT     Status: None   Collection  Time: 08/17/19  1:39 PM  Result Value Ref Range   aPTT 33 24 - 36 seconds    Comment: Performed at [facility], [place] [address] [place], [place] [address]  TSH     Status: None   Collection Time: 08/17/19  1:39 PM  Result Value Ref Range   TSH 1.920 0.350 - 4.500 uIU/mL    Comment: Performed by a 3rd Generation assay with a functional sensitivity of <=0.01 uIU/mL. Performed at [facility], [place] [address] [place], [place] [address]   POC occult blood, ED Provider will collect     Status: None   Collection Time: 08/17/19  4:53 PM  Result Value Ref Range   Fecal Occult Bld NEGATIVE NEGATIVE  Magnesium     Status: None   Collection Time: 08/17/19  5:35 PM  Result Value Ref Range   Magnesium 1.9 1.7 - 2.4 mg/dL    Comment: Performed at [facility], [place] [address], [place],  [address]   DG Chest 2 View  Result Date: 08/17/2019 CLINICAL DATA:  UTI symptoms diaphoretic EXAM: CHEST - 2 VIEW COMPARISON:  05/07/2006 FINDINGS: The heart size and mediastinal contours are within normal limits. Mild aortic atherosclerosis. Both lungs are clear. The visualized skeletal structures are unremarkable. IMPRESSION: No active cardiopulmonary disease. Electronically Signed   By: Donavan Foil M.D.   On: 08/17/2019 15:31    Pending Labs Unresulted Labs (From admission, onward)    Start     Ordered   08/18/19 [id number]  Basic metabolic panel  Tomorrow morning,   R     08/17/19 1601   08/18/19 0500  CBC  Tomorrow morning,   R     08/17/19 1601   08/17/19 1851  Ferritin  Add-on,   AD     08/17/19 1850   08/17/19 1851  Iron and TIBC  Add-on,   AD     08/17/19 1850   08/17/19 1851  Vitamin B12  Add-on,   AD     08/17/19 1850   08/17/19 1326  Urine culture  ONCE - STAT,   STAT     08/17/19 1325   08/17/19 1323  Culture, blood (Routine x 2)  BLOOD CULTURE X 2,   STAT     08/17/19 1322          Vitals/Pain Today's Vitals   08/17/19 1715  08/17/19 1745 08/17/19 1800 08/17/19 1845  BP: 103/69 124/83 97/71 96/64   Pulse: 68 86 90 84  Resp: (!) 22 (!) 22 (!) 24 18  Temp:      TempSrc:      SpO2: 100% 100% 99% 98%  Weight:      Height:      PainSc:        Isolation Precautions No active isolations  Medications Medications  tamsulosin (FLOMAX) capsule 0.4 mg (0.4 mg Oral Given 08/17/19 1728)  OXcarbazepine (TRILEPTAL) 300 MG/5ML suspension 300 mg (has no administration in time range)  acetaminophen (TYLENOL) tablet 650 mg (has no administration in time range)    Or  acetaminophen (TYLENOL) suppository 650 mg (has no administration in time range)  oxyCODONE (Oxy IR/ROXICODONE) immediate release tablet 5 mg (has no administration in time range)  polyethylene glycol (MIRALAX / GLYCOLAX) packet 17 g (has no administration in time range)  ondansetron (ZOFRAN) tablet 4 mg (has no administration in time range)    Or  ondansetron (ZOFRAN) injection 4 mg (has no administration in time range)  ceFEPIme (MAXIPIME) 2 g in sodium chloride 0.9 % 100 mL IVPB (0 g Intravenous Stopped 08/17/19 1852)  0.9 %  sodium chloride infusion ( Intravenous New Bag/Given (Non-Interop) 08/17/19 1725)  OXcarbazepine (TRILEPTAL) 300 MG/5ML suspension 600 mg (has no administration in time range)  cefTRIAXone (ROCEPHIN) 1 g in sodium chloride 0.9 % 100 mL IVPB (0 g Intravenous Stopped 08/17/19 1535)  sodium chloride 0.9 % bolus 2,028 mL (0 mL/kg  67.6 kg Intravenous Stopped 08/17/19 1538)  magnesium sulfate IVPB 1 g 100 mL (0 g Intravenous Stopped 08/17/19 1855)    Mobility non-ambulatory Low fall risk   Focused Assessments   R Recommendations: See Admitting Provider Note  Report given to:   Additional Notes:

## 2019-08-17 NOTE — H&P (Addendum)
History and Physical    DOA: 08/17/2019  PCP: Patient, No Pcp Per  Patient coming from: home  Chief Complaint: Altered mental status  HPI: Mason Blackburn is a 74 y.o. male with history h/o seizures who follows [facility] neurology brought in to ED in concern for altered mental status as noted by daughter this morning.  Patient was seen in ED on 06/30/2019 for abdominal symptoms and noted to have urinary retention/bladder distention along with bilateral mild hydronephrosis and colonic stool burden.  CT also reported "4.5 x 2.5 cm irregular hypoenhancing area in the central liver concerning for a malignancy, possibly a cholangiocarcinoma" at that time and MRCP was recommended but patient never followed up.  He however was seen for follow-up by urology, Foley catheter was discontinued but advised to be placed back as he did not pass voiding trial.  Patient apparently refused indwelling Foley catheter and went home.  Over the last week he has had fevers and general decline in condition.  Today he was very confused when daughter visited him which prompted ED visit. ED course: Afebrile while here, pulse 80, respiratory rate 20-25, blood pressure 81/57--> 95/66 with fluids, O2 sat 97% on room air.  WBC 20 K, hemoglobin 8.2, platelets 761, sodium 136, potassium 3.9, chloride 108, bicarb 19, BUN 45, creatinine 2.4, calcium 7.5, INR 1.3, glucose 123, lactate 1.3.  LFTs within normal limits (both bilirubin and transaminases), albumin 2.1.  Sepsis protocol was initiated in the ED and patient started on IV fluids/IV Rocephin.  Per ED physician, patient had purulent looking turbid urine on straight cath.  Foley catheter now reinserted.  Patient requested to be admitted for further evaluation and management. He is currently awake, alert and oriented to place. Person and time   Review of Systems: As per HPI otherwise 10 point review of systems negative.    Past Medical History:  Diagnosis Date  . Seizures  ([place])     History reviewed. No pertinent surgical history.  Social history:  reports that he has been smoking. He has never used smokeless tobacco. He reports previous alcohol use. He reports that he does not use drugs.   No Known Allergies  History reviewed. No pertinent family history.    Prior to Admission medications   Not on File    Physical Exam: Vitals:   08/17/19 1630 08/17/19 1645 08/17/19 1700 08/17/19 1715  BP: 91/65 99/68 101/66 103/69  Pulse: 83 76 74 68  Resp: (!) 21 20 20  (!) 22  Temp:      TempSrc:      SpO2: (!) 83% 100% 99% 100%  Weight:      Height:        Constitutional: NAD, calm, comfortable Eyes: PERRL, lids and conjunctivae normal ENMT: Mucous membranes are moist. Posterior pharynx clear of any exudate or lesions.Normal dentition.  Neck: normal, supple, no masses, no thyromegaly Respiratory: clear to auscultation bilaterally, no wheezing, no crackles. Normal respiratory effort. No accessory muscle use.  Cardiovascular: Regular rate and rhythm, no murmurs / rubs / gallops. No extremity edema. 2+ pedal pulses. No carotid bruits.  Abdomen: mild suprapubic  tenderness, no masses palpated. No hepatosplenomegaly. Bowel sounds positive.  Musculoskeletal: no clubbing / cyanosis. No joint deformity upper and lower extremities. Good ROM, no contractures. Normal muscle tone.  Neurologic: CN 2-12 grossly intact. Sensation intact, DTR normal. Strength 5/5 in all 4.  Psychiatric: Normal judgment and insight. Alert and oriented x 3. Normal mood.  SKIN/catheters: no rashes, lesions,  ulcers. No induration  Labs on Admission: I have personally reviewed following labs and imaging studies  CBC: Recent Labs  Lab 08/17/19 1339  WBC 20.3*  NEUTROABS 16.9*  HGB 8.2*  HCT 25.6*  MCV 91.4  PLT [id number]*   Basic Metabolic Panel: Recent Labs  Lab 08/17/19 1339  NA 136  K 3.9  CL 108  CO2 19*  GLUCOSE 123*  BUN 45*  CREATININE 2.44*  CALCIUM 7.5*    GFR: Estimated Creatinine Clearance: 26.2 mL/min (A) (by C-G formula based on SCr of 2.44 mg/dL (H)). Recent Labs  Lab 08/17/19 1323 08/17/19 1339  WBC  --  20.3*  LATICACIDVEN 1.3  --    Liver Function Tests: Recent Labs  Lab 08/17/19 1339  AST 11*  ALT 13  ALKPHOS 61  BILITOT 0.3  PROT 5.7*  ALBUMIN 2.1*   No results for input(s): LIPASE, AMYLASE in the last 168 hours. No results for input(s): AMMONIA in the last 168 hours. Coagulation Profile: Recent Labs  Lab 08/17/19 1339  INR 1.3*   Cardiac Enzymes: No results for input(s): CKTOTAL, CKMB, CKMBINDEX, TROPONINI in the last 168 hours. BNP (last 3 results) No results for input(s): PROBNP in the last 8760 hours. HbA1C: No results for input(s): HGBA1C in the last 72 hours. CBG: No results for input(s): GLUCAP in the last 168 hours. Lipid Profile: No results for input(s): CHOL, HDL, LDLCALC, TRIG, CHOLHDL, LDLDIRECT in the last 72 hours. Thyroid Function Tests: No results for input(s): TSH, T4TOTAL, FREET4, T3FREE, THYROIDAB in the last 72 hours. Anemia Panel: No results for input(s): VITAMINB12, FOLATE, FERRITIN, TIBC, IRON, RETICCTPCT in the last 72 hours. Urine analysis:    Component Value Date/Time   COLORURINE YELLOW 08/17/2019 1323   APPEARANCEUR TURBID (A) 08/17/2019 1323   LABSPEC 1.020 08/17/2019 1323   PHURINE 6.0 08/17/2019 1323   GLUCOSEU (A) 08/17/2019 1323    TEST NOT REPORTED DUE TO COLOR INTERFERENCE OF URINE PIGMENT   HGBUR (A) 08/17/2019 1323    TEST NOT REPORTED DUE TO COLOR INTERFERENCE OF URINE PIGMENT   BILIRUBINUR (A) 08/17/2019 1323    TEST NOT REPORTED DUE TO COLOR INTERFERENCE OF URINE PIGMENT   KETONESUR (A) 08/17/2019 1323    TEST NOT REPORTED DUE TO COLOR INTERFERENCE OF URINE PIGMENT   PROTEINUR (A) 08/17/2019 1323    TEST NOT REPORTED DUE TO COLOR INTERFERENCE OF URINE PIGMENT   NITRITE (A) 08/17/2019 1323    TEST NOT REPORTED DUE TO COLOR INTERFERENCE OF URINE PIGMENT    LEUKOCYTESUR (A) 08/17/2019 1323    TEST NOT REPORTED DUE TO COLOR INTERFERENCE OF URINE PIGMENT    Radiological Exams on Admission: Personally reviewed  DG Chest 2 View  Result Date: 08/17/2019 CLINICAL DATA:  UTI symptoms diaphoretic EXAM: CHEST - 2 VIEW COMPARISON:  05/07/2006 FINDINGS: The heart size and mediastinal contours are within normal limits. Mild aortic atherosclerosis. Both lungs are clear. The visualized skeletal structures are unremarkable. IMPRESSION: No active cardiopulmonary disease. Electronically Signed   By: Donavan Foil M.D.   On: 08/17/2019 15:31    EKG: Independently reviewed. NSR with borderline prolonged qtc     Assessment and Plan:   Principal Problem:   Sepsis secondary to UTI [facility]) Active Problems:   Acute metabolic encephalopathy   Acute urinary retention   AKI (acute kidney injury) ([place])   Liver mass    1.  UTI with acute metabolic encephalopathy and sepsis: Present on admission.  Patient with hypotension, tachypnea, leukocytosis and  endorgan damage including AKI and metabolic encephalopathy--meet sepsis criteria.  Blood pressure appears to be improving after 2 L fluid bolus.  Will admit with maintenance fluids and empiric antibiotics (will change Rocephin to cefepime to cover Pseudomonas as well given recent Foley catheter, at least until culture ID and sensitivity available)  2.  Acute renal failure: Could be a combination of obstructive uropathy as well as hypotension related prerenal effect/ATN.  Patient's lab work in April showed BUN 13 and creatinine 1.15 with GFR greater than 60.  Currently he has BUN 45, creatinine 2.4 and GFR 25.  Monitor response to IV hydration.  Avoid nephrotoxic agents.  3.  Seizure disorder: Resume home medications.  4.  Acute urinary retention: Now back on foley catheter. Resume Tamsulosin.   5. Acute normocytic anemia: unreliable historian. Per daughter , patient had "dark stool" in April . Stool guaic negative in  ED. Will order Fe studies and monitor Hgb , transfuse as needed. No evidence of hematuria in foley.  He never had a colonoscopy and explained to daughter he may need one at some point based on care goals.  6.  Abnormal CT report: Although CT reported liver mass/possible cholangiocarcinoma-patient's labs currently show normal liver function.  No evidence of hypoalbuminemia or elevated alkaline phosphatase making cholangiocarcinoma highly unlikely.  Will however need further work-up with MRCP when renal function improves and medically stable.  7. Prolonged Qtc: avoid QT prolonging agents. Keep potassium close to 4 and mag to 2.0  8. Functional decline/deconditioning : per daughter patient unable to live by himself safely over last few months with declining function and mental status. Will request PT evaluation for safe d/c planning.   DVT prophylaxis: SCD  COVID screen: Negative  Code Status:  Full code per patient .Health care proxy would be his daughter  Patient/Family Communication: Discussed with patient and daughter (over the phone) all questions answered to satisfaction.  Consults called: None Admission status :I certify that at the point of admission it is my clinical judgment that the patient will require inpatient hospital care spanning beyond 2 midnights from the point of admission due to high intensity of service and high frequency of surveillance required.Inpatient status is judged to be reasonable and necessary in order to provide the required intensity of service to ensure the patient's safety. The patient's presenting symptoms, physical exam findings, and initial radiographic and laboratory data in the context of their chronic comorbidities is felt to place them at high risk for further clinical deterioration. The following factors support the patient status of inpatient : Sepsis, AKI requiring IV fluids and IV antibiotics.     Guilford Shi MD [facility] Pager in  [place]  If 7PM-7AM, please contact night-coverage [email or web address]   08/17/2019, 5:27 PM

## 2019-08-17 NOTE — ED Provider Notes (Signed)
[place] DEPT Provider Note   CSN: [id number] Arrival date & time: 08/17/19  1304     History Chief Complaint  Patient presents with  . Hypotension  . Weakness    Mason Blackburn is a 74 y.o. male w/ hx of seizures on trilepta (follows at [facility] neurology), presented to emergency department with weakness UTI symptoms.    (The patient is a poor historian and his daughter [name] provides additional history at bedside).  Patient initially seen in emergency department approximately 1 month ago, had a CT scan of the abdomen which showed possible hepatic mass, as well as constipation and concern for urinary bladder outlet obstruction.  Foley catheter placed.  His UA showed no sign of infection.  Cr was normal at the time.  His daughter tammy took him to see urologist for follow-up appointment as scheduled 2 weeks later.  Urologist remove the Foley catheter in the office, but was concerned the patient could not adequately void, and so wanted to replace the catheter.  However the patient had refused at that time.  Her daughter took him home.  Her daughter tells me that beginning a week ago the patient was running fevers nearly every day at home.  He lives by himself and the family members check in on him.  She said she thought he was confused all week.  He has been diaphoretic.  She has been given him Motrin including this morning at 8 AM.  Today she came to the house and found the patient nearly obtunded in his room.  She said he was mumbling and not making any sense.  She took him initially to an urgent care, where the patient had a UA done and was noted to have obvious infection in his urine, as well as hypotension.  He was advised to go to the ER immediately.  EMS reported BP 63/30 and gave 1L IVF.  CBG 82.    Her daughter denies any known history of GI bleed, but reports that she does feel that the patient has been having black bowel movements for the past few weeks.   In the Ed the patient denies CP, abdominal pain, fevers, chills.   He has no medical allergies He only takes trilepta for seizures - he has a seizure once or twice per month on average per his daughter's report  He is a lifelong smoker and drinker of alcohol   HPI     Past Medical History:  Diagnosis Date  . Seizures [facility])     Patient Active Problem List   Diagnosis Date Noted  . ANXIETY DISORDER, GENERALIZED 06/02/2006  . ABUSE, ALCOHOL, IN REMISSION 06/02/2006  . TOBACCO ABUSE 06/02/2006  . VISUAL HALLUCINATION 06/02/2006  . PARASOMNIA 06/02/2006    History reviewed. No pertinent surgical history.     History reviewed. No pertinent family history.  Social History   Tobacco Use  . Smoking status: Current Every Day Smoker  . Smokeless tobacco: Never Used  Substance Use Topics  . Alcohol use: Not Currently  . Drug use: Never    Home Medications Prior to Admission medications   Not on File    Allergies    Patient has no known allergies.  Review of Systems   Review of Systems  Unable to perform ROS: Dementia (level 5 caveat)    Physical Exam Updated Vital Signs BP 95/66   Pulse 80   Temp (!) 97.4 F (36.3 C) (Oral)   Resp (!) 25  Ht 5\' 8"  (1.727 m)   Wt 67.6 kg   SpO2 97%   BMI 22.66 kg/m   Physical Exam Vitals and nursing note reviewed.  Constitutional:      General: He is not in acute distress.    Appearance: He is well-developed.     Comments: Thin male  HENT:     Head: Normocephalic and atraumatic.  Eyes:     Conjunctiva/sclera: Conjunctivae normal.  Cardiovascular:     Rate and Rhythm: Normal rate and regular rhythm.     Pulses: Normal pulses.  Pulmonary:     Effort: Pulmonary effort is normal. No respiratory distress.     Breath sounds: Normal breath sounds.  Abdominal:     General: There is no distension.     Palpations: Abdomen is soft.     Tenderness: There is no abdominal tenderness. There is no guarding.   Musculoskeletal:     Cervical back: Neck supple.  Skin:    General: Skin is warm and dry.  Neurological:     Mental Status: He is alert.     ED Results / Procedures / Treatments   Labs (all labs ordered are listed, but only abnormal results are displayed) Labs Reviewed  COMPREHENSIVE METABOLIC PANEL - Abnormal; Notable for the following components:      Result Value   CO2 19 (*)    Glucose, Bld 123 (*)    BUN 45 (*)    Creatinine, Ser 2.44 (*)    Calcium 7.5 (*)    Total Protein 5.7 (*)    Albumin 2.1 (*)    AST 11 (*)    GFR calc non Af Amer 25 (*)    GFR calc Af Amer 30 (*)    All other components within normal limits  CBC WITH DIFFERENTIAL/PLATELET - Abnormal; Notable for the following components:   WBC 20.3 (*)    RBC 2.80 (*)    Hemoglobin 8.2 (*)    HCT 25.6 (*)    RDW 15.8 (*)    Platelets 761 (*)    Neutro Abs 16.9 (*)    Monocytes Absolute 1.4 (*)    Abs Immature Granulocytes 0.17 (*)    All other components within normal limits  PROTIME-INR - Abnormal; Notable for the following components:   Prothrombin Time 16.1 (*)    INR 1.3 (*)    All other components within normal limits  SARS CORONAVIRUS 2 BY RT PCR (HOSPITAL ORDER, [place] [facility])  CULTURE, BLOOD (ROUTINE X 2)  CULTURE, BLOOD (ROUTINE X 2)  URINE CULTURE  LACTIC ACID, PLASMA  APTT  URINALYSIS, ROUTINE W REFLEX MICROSCOPIC  POC OCCULT BLOOD, ED    EKG EKG Interpretation  Date/Time:  Friday Aug 17 2019 13:27:30 EDT Ventricular Rate:  87 PR Interval:    QRS Duration: 104 QT Interval:  403 QTC Calculation: 485 R Axis:   53 Text Interpretation: Sinus rhythm Borderline prolonged QT interval No STEMI Confirmed by Octaviano Glow [phone]) on 08/17/2019 1:42:36 PM   Radiology DG Chest 2 View  Result Date: 08/17/2019 CLINICAL DATA:  UTI symptoms diaphoretic EXAM: CHEST - 2 VIEW COMPARISON:  05/07/2006 FINDINGS: The heart size and mediastinal contours are within normal  limits. Mild aortic atherosclerosis. Both lungs are clear. The visualized skeletal structures are unremarkable. IMPRESSION: No active cardiopulmonary disease. Electronically Signed   By: Donavan Foil M.D.   On: 08/17/2019 15:31    Procedures .Critical Care Performed by: Wyvonnia Dusky, MD  Authorized by: Wyvonnia Dusky, MD   Critical care provider statement:    Critical care time (minutes):  45   Critical care was necessary to treat or prevent imminent or life-threatening deterioration of the following conditions:  Sepsis   Critical care was time spent personally by me on the following activities:  Discussions with consultants, evaluation of patient's response to treatment, examination of patient, ordering and performing treatments and interventions, ordering and review of laboratory studies, ordering and review of radiographic studies, pulse oximetry, re-evaluation of patient's condition, obtaining history from patient or surrogate and review of old charts   (including critical care time)  Medications Ordered in ED Medications  cefTRIAXone (ROCEPHIN) 1 g in sodium chloride 0.9 % 100 mL IVPB (0 g Intravenous Stopped 08/17/19 1535)  sodium chloride 0.9 % bolus 2,028 mL (0 mL/kg  67.6 kg Intravenous Stopped 08/17/19 1538)    ED Course  I have reviewed the triage vital signs and the nursing notes.  Pertinent labs & imaging results that were available during my care of the patient were reviewed by me and considered in my medical decision making (see chart for details).  74 yo male here for weakness, hypotension, fevers for 1 week, purulent urine output at home.    History provided by his daughter as noted above is highly concerning for urosepsis.  Urine at the bedside is cloudy and appears purulent.  Sepsis w/u initiated Mentating well on arrival, aside from what I suspect is some baseline dementia (his daughter reports he has frequent "memory lapses").  No acute distress.   IV  ceftriaxone and 30 cc/kg bolus ordered for severe sepsis  BP responding well to IVF, doubtful of septic shock at this time Also noted to have AKI, likely prerenal from sepsis and dehydration  Also noted to be anemic, drop from 13 g in April 2021 to 8.2 g/dl today.  Possible GI source.  No SOB, lightheadedness, or symptoms of acute anemia to warrant emergent blood transfusion in the ED.  Rectal exam performed at bedside with no stool in rectal vault, no obvious melena.  Hemoccult pending.  With stable vitals, doubtful this is a GI hemorrhage that warrants emergent GI consult, but his primary team may consider GI consult for this and hepatic lesion seen on CT, as they deem appropriate.  COVID test negative here   Clinical Course as of Aug 16 1537  Fri Aug 17, 2019  1335 BP 81/57 in room, patient awake and well appearing, no complaints at the moment.  Urine appears cloudy white and purulent - ordered sepsis w/u and IV ceftriaxone and fluid bolus   [MT]  1430 WBC(!): 20.3 [MT]  1430 Creatinine(!): 2.44 [MT]  1430 BUN(!): 45 [MT]  1441 HR 78, 98% O2, suspect earlier recording erroneous   [MT]  1532 Signed out to dr Evon Slack.     [MT]    Clinical Course User Index [MT] Wyvonnia Dusky, MD    Final Clinical Impression(s) / ED Diagnoses Final diagnoses:  Sepsis, due to unspecified organism, unspecified whether acute organ dysfunction present ([place])  AKI (acute kidney injury) ([place])  Anemia, unspecified type    Rx / DC Orders ED Discharge Orders    None       Wyvonnia Dusky, MD 08/17/19 1539

## 2019-08-17 NOTE — ED Triage Notes (Signed)
Pt BIB EMS from UC. Pt reports weakness and UTI symptoms. Pt had a foley catheter that has since been removed. A&O x4. Pale and diaphoretic.  64/30 Low 90s SpO2 HR 80-90  CBG 82 18G RAC 1073mL NS

## 2019-08-18 DIAGNOSIS — R932 Abnormal findings on diagnostic imaging of liver and biliary tract: Secondary | ICD-10-CM

## 2019-08-18 DIAGNOSIS — Z602 Problems related to living alone: Secondary | ICD-10-CM

## 2019-08-18 DIAGNOSIS — A419 Sepsis, unspecified organism: Principal | ICD-10-CM

## 2019-08-18 DIAGNOSIS — N390 Urinary tract infection, site not specified: Secondary | ICD-10-CM

## 2019-08-18 DIAGNOSIS — G9341 Metabolic encephalopathy: Secondary | ICD-10-CM

## 2019-08-18 DIAGNOSIS — N179 Acute kidney failure, unspecified: Secondary | ICD-10-CM

## 2019-08-18 DIAGNOSIS — R339 Retention of urine, unspecified: Secondary | ICD-10-CM

## 2019-08-18 DIAGNOSIS — G40909 Epilepsy, unspecified, not intractable, without status epilepticus: Secondary | ICD-10-CM

## 2019-08-18 LAB — VITAMIN B12: Vitamin B-12: 438 pg/mL (ref 180–914)

## 2019-08-18 LAB — CBC
HCT: 24.6 % — ABNORMAL LOW (ref 39.0–52.0)
Hemoglobin: 7.7 g/dL — ABNORMAL LOW (ref 13.0–17.0)
MCH: 28.9 pg (ref 26.0–34.0)
MCHC: 31.3 g/dL (ref 30.0–36.0)
MCV: 92.5 fL (ref 80.0–100.0)
Platelets: 722 10*3/uL — ABNORMAL HIGH (ref 150–400)
RBC: 2.66 MIL/uL — ABNORMAL LOW (ref 4.22–5.81)
RDW: 16 % — ABNORMAL HIGH (ref 11.5–15.5)
WBC: 23.6 10*3/uL — ABNORMAL HIGH (ref 4.0–10.5)
nRBC: 0 % (ref 0.0–0.2)

## 2019-08-18 LAB — BASIC METABOLIC PANEL
Anion gap: 8 (ref 5–15)
BUN: 41 mg/dL — ABNORMAL HIGH (ref 8–23)
CO2: 15 mmol/L — ABNORMAL LOW (ref 22–32)
Calcium: 7.5 mg/dL — ABNORMAL LOW (ref 8.9–10.3)
Chloride: 112 mmol/L — ABNORMAL HIGH (ref 98–111)
Creatinine, Ser: 2.33 mg/dL — ABNORMAL HIGH (ref 0.61–1.24)
GFR calc Af Amer: 31 mL/min — ABNORMAL LOW (ref >60)
GFR calc non Af Amer: 27 mL/min — ABNORMAL LOW (ref >60)
Glucose, Bld: 105 mg/dL — ABNORMAL HIGH (ref 70–99)
Potassium: 4.3 mmol/L (ref 3.5–5.1)
Sodium: 135 mmol/L (ref 135–145)

## 2019-08-18 LAB — IRON AND TIBC
Iron: 8 ug/dL — ABNORMAL LOW (ref 45–182)
Saturation Ratios: 6 % — ABNORMAL LOW (ref 17.9–39.5)
TIBC: 138 ug/dL — ABNORMAL LOW (ref 250–450)
UIBC: 130 ug/dL

## 2019-08-18 LAB — FOLATE: Folate: 6.6 ng/mL (ref >5.9)

## 2019-08-18 LAB — FERRITIN: Ferritin: 267 ng/mL (ref 24–336)

## 2019-08-18 MED ORDER — ADULT MULTIVITAMIN W/MINERALS CH
1.0000 | ORAL_TABLET | Freq: Every day | ORAL | Status: DC
  Administered 2019-08-18 – 2019-08-22 (×5): 1 via ORAL
  Filled 2019-08-18 (×5): qty 1

## 2019-08-18 MED ORDER — ENSURE ENLIVE PO LIQD
237.0000 mL | Freq: Two times a day (BID) | ORAL | Status: DC
  Administered 2019-08-18 – 2019-08-22 (×5): 237 mL via ORAL

## 2019-08-18 NOTE — Progress Notes (Signed)
Initial Nutrition Assessment  DOCUMENTATION CODES:   Not applicable  INTERVENTION:  Ensure Enlive po BID, each supplement provides 350 kcal and 20 grams of protein  MVI with minerals daily   NUTRITION DIAGNOSIS:   Increased nutrient needs related to acute illness(sepsis secondary to UTI) as evidenced by estimated needs.    GOAL:   Patient will meet greater than or equal to 90% of their needs   MONITOR:   PO intake, Labs, I & O's, Supplement acceptance, Weight trends  REASON FOR ASSESSMENT:   Malnutrition Screening Tool    ASSESSMENT:  RD working remotely.  74 year old male admitted for sepsis secondary to UTI after presenting with 1 week history of fevers and general decline in cognition per report of family. Past medical history of seizure followed by [facility] neurology and was recently seen in ED on 06/30/19 for urinary retention/bladder distention, mild hydronephrosis and colonic stool burden.  Patient with ongoing AMS, noted not oriented to place or situation, poor insight to safety/judgement and pulling at IV. Unable to obtain nutrition history at this time. Per flowsheets, patient consumed 0% of breakfast and 100% of lunch today. Will continue to monitor meal intakes and provide Ensure supplement to aid with meeting needs.  Current wt 148.72 lb No past weight history for review I/Os: +2128 ml since admit UOP: 200 ml x 24 hrs Medications reviewed and include: IVF: NaCl IVPB: Maxipime Labs: BUN 41 (H),Ccr 2.33 (H), WBC 23.6 (H), Hgb 7.7 (L)  NUTRITION - FOCUSED PHYSICAL EXAM: Unable to complete at this time, RD working remotely.  Diet Order:   Diet Order            DIET SOFT Room service appropriate? Yes; Fluid consistency: Thin  Diet effective now              EDUCATION NEEDS:   No education needs have been identified at this time  Skin:  Skin Assessment: Reviewed RN Assessment  Last BM:  unknown  Height:   Ht Readings from Last 1 Encounters:   08/17/19 5\' 8"  (1.727 m)    Weight:   Wt Readings from Last 1 Encounters:  08/17/19 67.6 kg   BMI:  Body mass index is 22.66 kg/m.  Estimated Nutritional Needs:   Kcal:  2028-2163  Protein:  81-95  Fluid:  >/= 1.7 L/day   Lajuan Lines, RD, LDN Clinical Nutrition After Hours/Weekend Pager # in [place]

## 2019-08-18 NOTE — Progress Notes (Signed)
Patient ID: Mason Blackburn, male   DOB: 02-13-1946, 74 y.o.   MRN: [id number]  PROGRESS NOTE    Mason Blackburn  [id number] DOB: [birth year] DOA: 08/17/2019 PCP: Patient, No Pcp Per    Brief Narrative:  Mason Blackburn is a 74 y.o. male with history h/o seizures who follows [facility] neurology brought in to ED in concern for altered mental status as noted by daughter this morning.  Patient was seen in ED on 06/30/2019 for abdominal symptoms and noted to have urinary retention/bladder distention along with bilateral mild hydronephrosis and colonic stool burden.  CT also reported "4.5 x 2.5 cm irregular hypoenhancing area in the central liver concerning for a malignancy, possibly a cholangiocarcinoma" at that time and MRCP was recommended but patient never followed up.  He however was seen for follow-up by urology, Foley catheter was discontinued but advised to be placed back as he did not pass voiding trial.  Patient apparently refused indwelling Foley catheter and went home.  Over the last week he has had fevers and general decline in condition.  Today he was very confused when daughter visited him which prompted ED visit.  ED course: Afebrile while here, pulse 80, respiratory rate 20-25, blood pressure 81/57--> 95/66 with fluids, O2 sat 97% on room air.  WBC 20 K, hemoglobin 8.2, platelets 761, sodium 136, potassium 3.9, chloride 108, bicarb 19, BUN 45, creatinine 2.4, calcium 7.5, INR 1.3, glucose 123, lactate 1.3.  LFTs within normal limits (both bilirubin and transaminases), albumin 2.1.  Sepsis protocol was initiated in the ED and patient started on IV fluids/IV Rocephin.  Per ED physician, patient had purulent looking turbid urine on straight cath.  Foley catheter now reinserted.  Patient requested to be admitted for further evaluation and management. He is currently awake, alert and oriented to place. Person and time   Assessment & Plan:   Principal Problem:   Sepsis secondary to UTI  [facility]) Active Problems:   Acute metabolic encephalopathy   Acute urinary retention   AKI (acute kidney injury) ([place])   Liver mass   Sepsis ([place])  1.  UTI with acute metabolic encephalopathy and sepsis: Present on admission.  Patient with hypotension, tachypnea, leukocytosis and endorgan damage including AKI and metabolic encephalopathy--meet sepsis criteria.  Blood pressure appears to be improving after 2 L fluid bolus, Empiric antibiotics (will change Rocephin to cefepime to cover Pseudomonas as well given recent Foley catheter)  2.  Acute renal failure: Could be a combination of obstructive uropathy as well as hypotension related prerenal effect/ATN.  Patient's lab work in April showed BUN 13 and creatinine 1.15 with GFR greater than 60.  Currently he has BUN 45, creatinine 2.4 and GFR 25.  Monitor response to IV hydration.  Avoid nephrotoxic agents.  3.  Seizure disorder: Resume home medications.  4.  Acute urinary retention: Now back on foley catheter. Resume Tamsulosin.   5. Acute normocytic anemia: unreliable historian. Per daughter , patient had "dark stool" in April . Stool guaic negative in ED. Will order Fe studies and monitor Hgb , transfuse as needed. No evidence of hematuria in foley.  He never had a colonoscopy and explained to daughter he may need one at some point based on care goals.  6.  Abnormal CT report: Although CT reported liver mass/possible cholangiocarcinoma-patient's labs currently show normal liver function.  No evidence of hypoalbuminemia or elevated alkaline phosphatase making cholangiocarcinoma highly unlikely.  Will however need further work-up with MRCP when renal function improves  and medically stable.  7. Prolonged Qtc: avoid QT prolonging agents. Keep potassium close to 4 and mag to 2.0  8. Functional decline/deconditioning : per daughter patient unable to live by himself safely over last few months with declining function and mental status. Will  request PT evaluation for safe d/c planning.    DVT prophylaxis: SCD/Compression stockings Code Status: Full code  Family Communication: Daughter by phone Disposition Plan: SNF   Consultants:   None  Procedures:  None  Antimicrobials: Anti-infectives (From admission, onward)   Start     Dose/Rate Route Frequency Ordered Stop   08/17/19 1630  ceFEPIme (MAXIPIME) 2 g in sodium chloride 0.9 % 100 mL IVPB     2 g 200 mL/hr over 30 Minutes Intravenous Every 24 hours 08/17/19 1601     08/17/19 1330  cefTRIAXone (ROCEPHIN) 1 g in sodium chloride 0.9 % 100 mL IVPB     1 g 200 mL/hr over 30 Minutes Intravenous  Once 08/17/19 1327 08/17/19 1535       Subjective: States he is very tired. Nursing reports he is urinating quite frequently and urine is milky and turbid  Objective: Vitals:   08/17/19 2007 08/18/19 0443 08/18/19 0910 08/18/19 1443  BP: 110/76 98/66 (!) 94/57 112/70  Pulse: 97 89 79 81  Resp: 19 20 16 14   Temp: 98.1 F (36.7 C) 98.5 F (36.9 C) 98.2 F (36.8 C) 98 F (36.7 C)  TempSrc: Oral Oral Oral Oral  SpO2: 97% 94% 98% 100%  Weight:      Height:        Intake/Output Summary (Last 24 hours) at 08/18/2019 1656 Last data filed at 08/18/2019 1608 Gross per 24 hour  Intake 440 ml  Output 355 ml  Net 85 ml   Filed Weights   08/17/19 1334  Weight: 67.6 kg    Examination:  General exam: Appears calm and comfortable  Respiratory system: Clear to auscultation. Respiratory effort normal. Cardiovascular system: S1 & S2 heard, RRR.  Gastrointestinal system: Abdomen is nondistended, soft and nontender.  Central nervous system: Alert and oriented. No focal neurological deficits. Extremities: Symmetric  Skin: No rashes   Data Reviewed: I have personally reviewed following labs and imaging studies  CBC: Recent Labs  Lab 08/17/19 1339 08/18/19 0359  WBC 20.3* 23.6*  NEUTROABS 16.9*  --   HGB 8.2* 7.7*  HCT 25.6* 24.6*  MCV 91.4 92.5  PLT 761* 722*    Basic Metabolic Panel: Recent Labs  Lab 08/17/19 1339 08/17/19 1735 08/18/19 0359  NA 136  --  135  K 3.9  --  4.3  CL 108  --  112*  CO2 19*  --  15*  GLUCOSE 123*  --  105*  BUN 45*  --  41*  CREATININE 2.44*  --  2.33*  CALCIUM 7.5*  --  7.5*  MG  --  1.9  --    GFR: Estimated Creatinine Clearance: 27.4 mL/min (A) (by C-G formula based on SCr of 2.33 mg/dL (H)). Liver Function Tests: Recent Labs  Lab 08/17/19 1339  AST 11*  ALT 13  ALKPHOS 61  BILITOT 0.3  PROT 5.7*  ALBUMIN 2.1*   No results for input(s): LIPASE, AMYLASE in the last 168 hours. No results for input(s): AMMONIA in the last 168 hours. Coagulation Profile: Recent Labs  Lab 08/17/19 1339  INR 1.3*   Cardiac Enzymes: No results for input(s): CKTOTAL, CKMB, CKMBINDEX, TROPONINI in the last 168 hours. BNP (last 3 results)  No results for input(s): PROBNP in the last 8760 hours. HbA1C: No results for input(s): HGBA1C in the last 72 hours. CBG: No results for input(s): GLUCAP in the last 168 hours. Lipid Profile: No results for input(s): CHOL, HDL, LDLCALC, TRIG, CHOLHDL, LDLDIRECT in the last 72 hours. Thyroid Function Tests: Recent Labs    08/17/19 1339  TSH 1.920   Anemia Panel: Recent Labs    08/17/19 1851  VITAMINB12 438  FERRITIN 267  TIBC 138*  IRON 8*   Sepsis Labs: Recent Labs  Lab 08/17/19 1323  LATICACIDVEN 1.3    Recent Results (from the past 240 hour(s))  Culture, blood (Routine x 2)     Status: None (Preliminary result)   Collection Time: 08/17/19  1:23 PM   Specimen: BLOOD RIGHT FOREARM  Result Value Ref Range Status   Specimen Description   Final    BLOOD RIGHT FOREARM Performed at [facility], [place] [address], [place], [place] [address]    Special Requests   Final    BOTTLES DRAWN AEROBIC ONLY Blood Culture results may not be optimal due to an excessive volume of blood received in culture bottles Performed at [facility] [address], [place], [place] [address]    Culture   Final    NO GROWTH < 24 HOURS Performed at [facility] [address] [place], [place] [address]    Report Status PENDING  Incomplete  Culture, blood (Routine x 2)     Status: None (Preliminary result)   Collection Time: 08/17/19  1:28 PM   Specimen: BLOOD  Result Value Ref Range Status   Specimen Description   Final    BLOOD LEFT ANTECUBITAL Performed at [facility] [address] [place], [place] [address]    Special Requests   Final    BOTTLES DRAWN AEROBIC ONLY Blood Culture results may not be optimal due to an excessive volume of blood received in culture bottles Performed at [facility] [address], [place], [place] [address]    Culture   Final    NO GROWTH < 24 HOURS Performed at [facility] [address], [place], [place] [address]    Report Status PENDING  Incomplete  SARS Coronavirus 2 by RT PCR (hospital order, performed in [facility] hospital lab) Nasopharyngeal Nasopharyngeal Swab     Status: None   Collection Time: 08/17/19  1:28 PM   Specimen: Nasopharyngeal Swab  Result Value Ref Range Status   SARS Coronavirus 2 NEGATIVE NEGATIVE Final    Comment: (NOTE) SARS-CoV-2 target nucleic acids are NOT DETECTED. The SARS-CoV-2 RNA is generally detectable in upper and lower respiratory specimens during the acute phase of infection. The lowest concentration of SARS-CoV-2 viral copies this assay can detect is 250 copies / mL. A negative result does not preclude SARS-CoV-2 infection and should not be used as the sole basis for treatment or other patient management decisions.  A negative result may occur with improper specimen collection / handling, submission of specimen other than nasopharyngeal swab, presence of viral mutation(s) within the areas targeted by this assay, and inadequate number of viral copies (<250 copies / mL). A negative  result must be combined with clinical observations, patient history, and epidemiological information. Fact Sheet for Patients:   StrictlyIdeas.no Fact Sheet for Healthcare Providers: BankingDealers.co.za This test is not yet approved or cleared  by the [place] FDA and has been authorized for detection  and/or diagnosis of SARS-CoV-2 by FDA under an Emergency Use Authorization (EUA).  This EUA will remain in effect (meaning this test can be used) for the duration of the COVID-19 declaration under Section 564(b)(1) of the Act, 21 U.S.C. section 360bbb-3(b)(1), unless the authorization is terminated or revoked sooner. Performed at [facility], [place] [address], [place], [place] [address]       Radiology Studies: DG Chest 2 View  Result Date: 08/17/2019 CLINICAL DATA:  UTI symptoms diaphoretic EXAM: CHEST - 2 VIEW COMPARISON:  05/07/2006 FINDINGS: The heart size and mediastinal contours are within normal limits. Mild aortic atherosclerosis. Both lungs are clear. The visualized skeletal structures are unremarkable. IMPRESSION: No active cardiopulmonary disease. Electronically Signed   By: Donavan Foil M.D.   On: 08/17/2019 15:31     Scheduled Meds: . feeding supplement (ENSURE ENLIVE)  237 mL Oral BID BM  . multivitamin with minerals  1 tablet Oral Daily  . OXcarbazepine  300 mg Oral Daily  . OXcarbazepine  600 mg Oral QHS  . tamsulosin  0.4 mg Oral Daily   Continuous Infusions: . sodium chloride 150 mL/hr at 08/18/19 1558  . ceFEPime (MAXIPIME) IV 2 g (08/18/19 1615)     LOS: 1 day    Donnamae Jude, MD 08/18/2019 4:56 PM [phone] [facility] If 7PM-7AM, please contact night-coverage 08/18/2019, 4:56 PM

## 2019-08-18 NOTE — Evaluation (Signed)
Physical Therapy Evaluation Patient Details Name: Mason Blackburn MRN: [id number] DOB: [birth year] Today's Date: 08/18/2019   History of Present Illness  Pt is 74 yo male with PMH of seizures.  Patient was seen in ED on 06/30/2019 for abdominal symptoms and noted to have urinary retention/bladder distention along with bilateral mild hydronephrosis and colonic stool burden.   Foley catheter was recomend but pt refused and went home.  Over the last week pt with decline in condition and increased confusion.  Pt now admitted with sepsis and metabolic encephalopathy from UTI>  Clinical Impression  Pt admitted with above diagnosis. Pt was able to ambulate and transfers with min guard to min A level but with poor safety and multiple LOB.  Pt required cues for gait training and balance and min A for multiple LOB.  Pt was oriented to self and place but did demonstrate some confusion in conversation and with poor safety awareness.  At this time, from PT perspective pt is not safe to be home alone due to confusion, fall risk, and multiple LOB.  Pt currently with functional limitations due to the deficits listed below (see PT Problem List). Pt will benefit from skilled PT to increase their independence and safety with mobility to allow discharge to the venue listed below.       Follow Up Recommendations SNF;Supervision/Assistance - 24 hour    Equipment Recommendations  Rolling walker with 5" wheels;Cane(to be further assessed)    Recommendations for Other Services       Precautions / Restrictions Precautions Precautions: Fall      Mobility  Bed Mobility Overal bed mobility: Needs Assistance Bed Mobility: Supine to Sit;Sit to Supine     Supine to sit: Min guard Sit to supine: Min guard   General bed mobility comments: for steadying  Transfers Overall transfer level: Needs assistance Equipment used: None Transfers: Sit to/from Stand Sit to Stand: Min guard         General transfer comment:  for steadying  Ambulation/Gait Ambulation/Gait assistance: Min assist Gait Distance (Feet): 400 Feet Assistive device: None Gait Pattern/deviations: Step-through pattern;Scissoring;Narrow base of support;Drifts right/left Gait velocity: normal   General Gait Details: Pt with multiple LOB requiring min A to recover.  LOB tended to occur with scissor gait or if pt distracted and looking from side to side. Cued for increased BOS, monitoring for scissoring, and limiting distractions/looking around  Stairs            Wheelchair Mobility    Modified Rankin (Stroke Patients Only)       Balance Overall balance assessment: Needs assistance Sitting-balance support: No upper extremity supported;Feet supported Sitting balance-Leahy Scale: Normal     Standing balance support: No upper extremity supported Standing balance-Leahy Scale: Fair Standing balance comment: Pt able to stand for toielting ADLs but swaying and requiring close guarding for safety                             Pertinent Vitals/Pain Pain Assessment: No/denies pain    Home Living Family/patient expects to be discharged to:: Private residence Living Arrangements: Alone Available Help at Discharge: Family;Available PRN/intermittently Type of Home: Apartment Home Access: Stairs to enter Entrance Stairs-Rails: None Entrance Stairs-Number of Steps: 2 Home Layout: One level Home Equipment: Grab bars - tub/shower      Prior Function Level of Independence: Needs assistance      ADL's / Homemaking Assistance Needed: Pt reports independent with ADLs and  IADLs; does not drive        Hand Dominance        Extremity/Trunk Assessment   Upper Extremity Assessment Upper Extremity Assessment: Defer to OT evaluation    Lower Extremity Assessment Lower Extremity Assessment: Overall WFL for tasks assessed    Cervical / Trunk Assessment Cervical / Trunk Assessment: Normal  Communication    Communication: No difficulties  Cognition Arousal/Alertness: Awake/alert Behavior During Therapy: WFL for tasks assessed/performed Overall Cognitive Status: No family/caregiver present to determine baseline cognitive functioning Area of Impairment: Problem solving                 Orientation Level: Person;Place     Following Commands: Follows one step commands inconsistently Safety/Judgement: Decreased awareness of safety;Decreased awareness of deficits   Problem Solving: Slow processing        General Comments General comments (skin integrity, edema, etc.): vss    Exercises     Assessment/Plan    PT Assessment Patient needs continued PT services  PT Problem List Decreased strength;Decreased mobility;Decreased safety awareness;Decreased range of motion;Decreased coordination;Decreased activity tolerance;Decreased cognition;Decreased balance;Decreased knowledge of use of DME       PT Treatment Interventions DME instruction;Therapeutic activities;Gait training;Therapeutic exercise;Patient/family education;Stair training;Balance training;Functional mobility training    PT Goals (Current goals can be found in the Care Plan section)  Acute Rehab PT Goals Patient Stated Goal: pt would like to go home but per chart/RN family concerned about pt being alone PT Goal Formulation: With patient Time For Goal Achievement: 09/01/19 Potential to Achieve Goals: Good Additional Goals Additional Goal #1: Will score >19 on DGI to indicate low fall risk    Frequency Min 2X/week   Barriers to discharge Decreased caregiver support      Co-evaluation               AM-PAC PT "6 Clicks" Mobility  Outcome Measure Help needed turning from your back to your side while in a flat bed without using bedrails?: None Help needed moving from lying on your back to sitting on the side of a flat bed without using bedrails?: None Help needed moving to and from a bed to a chair (including a  wheelchair)?: A Little Help needed standing up from a chair using your arms (e.g., wheelchair or bedside chair)?: A Little Help needed to walk in hospital room?: A Little Help needed climbing 3-5 steps with a railing? : A Lot 6 Click Score: 19    End of Session Equipment Utilized During Treatment: Gait belt Activity Tolerance: Patient tolerated treatment well Patient left: with chair alarm set;in chair;with call bell/phone within reach Nurse Communication: Mobility status PT Visit Diagnosis: Unsteadiness on feet (R26.81)    Time: [id number] PT Time Calculation (min) (ACUTE ONLY): 28 min   Charges:   PT Evaluation $PT Eval Moderate Complexity: 1 Mod PT Treatments $Gait Training: 8-22 mins        Maggie Font, PT Acute Rehab Services Pager [phone] [facility] [phone] [facility] 08/18/2019, 11:20 AM

## 2019-08-18 NOTE — Progress Notes (Signed)
Occupational Therapy Evaluation Patient Details Name: Mason Blackburn MRN: [id number] DOB: [birth year] Today's Date: 08/18/2019    History of Present Illness Pt is 74 yo male with PMH of seizures.  Patient was seen in ED on 06/30/2019 for abdominal symptoms and noted to have urinary retention/bladder distention along with bilateral mild hydronephrosis and colonic stool burden.   Foley catheter was recomend but pt refused and went home.  Over the last week pt with decline in condition and increased confusion.  Pt now admitted with sepsis and metabolic encephalopathy from UTI>   Clinical Impression   PTA, pt lived alone and was independent with ADL tasks. Pt states his children do his shopping but he does his own medication management. Pt not oriented to place or situation "I don't know why I'm here". Pt unsteady with ambulation to bathroom, requiring assistance to prevent fall. Poor insight into safety/judgement, almost pulling out IV. If pt progresses and becomes more stable with mobility and cognition clears, he may be able to DC home with 24/7 S; however at this time, due to confusion and assistance needed for ADL and mobility, recommend rehab at [facility]. Will follow acutely.     Follow Up Recommendations  Supervision/Assistance - 24 hour;SNF    Equipment Recommendations  3 in 1 bedside commode    Recommendations for Other Services       Precautions / Restrictions Precautions Precautions: Fall      Mobility Bed Mobility Overal bed mobility: Modified Independent   Transfers Overall transfer level: Needs assistance Equipment used: None Transfers: Sit to/from Stand;Stand Pivot Transfers Sit to Stand: Min guard Stand pivot transfers: Min assist       General transfer comment: unsteady wtih gait    Balance Overall balance assessment: Needs assistance Sitting-balance support: No upper extremity supported;Feet supported Sitting balance-Leahy Scale: Good     Standing balance support:  No upper extremity supported Standing balance-Leahy Scale: Poor Standing balance comment: Pt with swaying/LOB - requires assistance to prevent fall                           ADL either performed or assessed with clinical judgement   ADL Overall ADL's : Needs assistance/impaired Eating/Feeding: Set up   Grooming: Min guard;Standing   Upper Body Bathing: Set up;Supervision/ safety;Sitting   Lower Body Bathing: Min guard;Sit to/from stand   Upper Body Dressing : Supervision/safety;Set up;Sitting   Lower Body Dressing: Minimal assistance;Sit to/from stand   Toilet Transfer: Minimal assistance;Ambulation;Grab bars;Comfort height toilet   Toileting- Clothing Manipulation and Hygiene: Min guard;Sit to/from stand       Functional mobility during ADLs: Minimal assistance;Cueing for safety       Vision         Perception     Praxis      Pertinent Vitals/Pain Pain Assessment: No/denies pain     Hand Dominance Right   Extremity/Trunk Assessment Upper Extremity Assessment Upper Extremity Assessment: Overall WFL for tasks assessed   Lower Extremity Assessment Lower Extremity Assessment: Defer to PT evaluation   Cervical / Trunk Assessment Cervical / Trunk Assessment: Normal   Communication Communication Communication: No difficulties   Cognition Arousal/Alertness: Awake/alert Behavior During Therapy: Impulsive Overall Cognitive Status: Impaired/Different from baseline Area of Impairment: Orientation;Attention;Memory;Following commands;Safety/judgement;Awareness;Problem solving                 Orientation Level: Disoriented to;Time;Situation;Place Current Attention Level: Sustained Memory: Decreased short-term memory Following Commands: Follows one step commands with increased  time Safety/Judgement: Decreased awareness of safety;Decreased awareness of deficits Awareness: Intellectual Problem Solving: Slow processing General Comments: unaware of  IV almost pulling it out; educated pt on need to push nurse button for assistance; returned to room with bed alarm going off and pt trying to get OOB unassisted   General Comments  vss    Exercises     Shoulder Instructions      Home Living Family/patient expects to be discharged to:: Private residence Living Arrangements: Alone Available Help at Discharge: Family;Available PRN/intermittently Type of Home: Apartment Home Access: Stairs to enter Entrance Stairs-Number of Steps: 2 Entrance Stairs-Rails: None Home Layout: One level     Bathroom Shower/Tub: Teacher, early years/pre: Standard Bathroom Accessibility: No   Home Equipment: Grab bars - tub/shower          Prior Functioning/Environment Level of Independence: Needs assistance    ADL's / Homemaking Assistance Needed: Pt reports independent with ADLs and IADLs; does not drive; daughter or son do shopping for him; does his own Education administrator            OT Problem List: Decreased strength;Decreased activity tolerance;Impaired balance (sitting and/or standing);Decreased cognition;Decreased safety awareness;Decreased knowledge of use of DME or AE      OT Treatment/Interventions: Self-care/ADL training;Therapeutic exercise    OT Goals(Current goals can be found in the care plan section) Acute Rehab OT Goals Patient Stated Goal: to get out of the hospital OT Goal Formulation: Patient unable to participate in goal setting Time For Goal Achievement: 09/01/19 Potential to Achieve Goals: Good  OT Frequency: Min 2X/week   Barriers to D/C:            Co-evaluation              AM-PAC OT "6 Clicks" Daily Activity     Outcome Measure Help from another person eating meals?: None Help from another person taking care of personal grooming?: A Little Help from another person toileting, which includes using toliet, bedpan, or urinal?: A Little Help from another person bathing (including washing,  rinsing, drying)?: A Little Help from another person to put on and taking off regular upper body clothing?: A Little Help from another person to put on and taking off regular lower body clothing?: A Little 6 Click Score: 19   End of Session Nurse Communication: Mobility status  Activity Tolerance: Patient tolerated treatment well Patient left: in bed;with call bell/phone within reach;with bed alarm set  OT Visit Diagnosis: Unsteadiness on feet (R26.81);Muscle weakness (generalized) (M62.81);Other symptoms and signs involving cognitive function                Time: [id number] OT Time Calculation (min): 27 min Charges:  OT General Charges $OT Visit: 1 Visit OT Evaluation $OT Eval Moderate Complexity: 1 Mod OT Treatments $Self Care/Home Management : 8-22 mins  Maurie Boettcher, OT/L   Acute OT Clinical Specialist Acute Rehabilitation Services Pager [phone] Office [phone]   [facility] 08/18/2019, 2:30 PM

## 2019-08-18 NOTE — Progress Notes (Signed)
Patient has had multiple episodes of urgency and  Incontinence. Does have some burning with urination. Urine has been a whitish "milky" color. No true odor. Eulas Post, RN

## 2019-08-19 ENCOUNTER — Inpatient Hospital Stay (HOSPITAL_COMMUNITY): Payer: Medicare Other

## 2019-08-19 LAB — COMPREHENSIVE METABOLIC PANEL
ALT: 14 U/L (ref 0–44)
AST: 16 U/L (ref 15–41)
Albumin: 2.1 g/dL — ABNORMAL LOW (ref 3.5–5.0)
Alkaline Phosphatase: 72 U/L (ref 38–126)
Anion gap: 6 (ref 5–15)
BUN: 34 mg/dL — ABNORMAL HIGH (ref 8–23)
CO2: 17 mmol/L — ABNORMAL LOW (ref 22–32)
Calcium: 8 mg/dL — ABNORMAL LOW (ref 8.9–10.3)
Chloride: 116 mmol/L — ABNORMAL HIGH (ref 98–111)
Creatinine, Ser: 1.6 mg/dL — ABNORMAL HIGH (ref 0.61–1.24)
GFR calc Af Amer: 49 mL/min — ABNORMAL LOW (ref >60)
GFR calc non Af Amer: 42 mL/min — ABNORMAL LOW (ref >60)
Glucose, Bld: 104 mg/dL — ABNORMAL HIGH (ref 70–99)
Potassium: 3.7 mmol/L (ref 3.5–5.1)
Sodium: 139 mmol/L (ref 135–145)
Total Bilirubin: 0.4 mg/dL (ref 0.3–1.2)
Total Protein: 6 g/dL — ABNORMAL LOW (ref 6.5–8.1)

## 2019-08-19 LAB — CBC
HCT: 28.2 % — ABNORMAL LOW (ref 39.0–52.0)
Hemoglobin: 9 g/dL — ABNORMAL LOW (ref 13.0–17.0)
MCH: 28.8 pg (ref 26.0–34.0)
MCHC: 31.9 g/dL (ref 30.0–36.0)
MCV: 90.4 fL (ref 80.0–100.0)
Platelets: 811 10*3/uL — ABNORMAL HIGH (ref 150–400)
RBC: 3.12 MIL/uL — ABNORMAL LOW (ref 4.22–5.81)
RDW: 16.3 % — ABNORMAL HIGH (ref 11.5–15.5)
WBC: 19.3 10*3/uL — ABNORMAL HIGH (ref 4.0–10.5)
nRBC: 0 % (ref 0.0–0.2)

## 2019-08-19 LAB — URINE CULTURE: Culture: >=100000 — AB

## 2019-08-19 MED ORDER — CEFAZOLIN SODIUM-DEXTROSE 1-4 GM/50ML-% IV SOLN
1.0000 g | Freq: Two times a day (BID) | INTRAVENOUS | Status: DC
  Administered 2019-08-19 – 2019-08-20 (×2): 1 g via INTRAVENOUS
  Filled 2019-08-19 (×2): qty 50

## 2019-08-19 NOTE — Progress Notes (Signed)
IR received request for liver mass biopsy. Pt initially diagnosed with liver mass 06/30/19 Was apparently suppose to follow up at [facility] but doesn't appear that he did. Has now returned and been admitted here with altered mental status from Urosepsis with AKI. Review of his prior imaging has prompted request for biopsy of hepatic mass.  Case and Imaging reviewed with Dr. Kathlene Cote. Recommend stabilization of acute illness, followed by further imaging. Has been 6 weeks since previous imaging. Recommend MRI abdomen with and without contrast to assess liver mass if no contraindications. If unable to get MR, could do CT abdomen/pelvis with multiphase liver mass protocol once renal function back to baseline. Once imaging complete, can then workup for biopsy of liver lesion vs other if new imaging reveals other findings.  Ascencion Dike PA-C Interventional Radiology 08/19/2019 2:49 PM

## 2019-08-19 NOTE — Progress Notes (Signed)
PROGRESS NOTE    Mason Blackburn  [id number] DOB: Jul 13, 1945 DOA: 08/17/2019 PCP: Patient, No Pcp Per    Brief Narrative:  Mason Blackburn a 74 y.o.malewith history h/oseizures who follows [facility] neurology brought in to ED in concern for altered mental status as noted by daughter this morning. Patient was seen in ED on 06/30/2019 for abdominal symptoms and noted to have urinary retention/bladder distention along with bilateral mild hydronephrosis and colonic stool burden. CT also reported "4.5 x 2.5 cm irregular hypoenhancing area in the central liver concerning for a malignancy, possibly a cholangiocarcinoma" at that time and MRCP was recommended but patient never followed up. He however was seen for follow-up by urology, Foley catheter was discontinued but advised to be placed back as he did not pass voiding trial. Patient apparently was taught intermittent catheterization and sent home. Over the last week he has had fevers and general decline in condition. He became more confused when daughter visited him which prompted ED visit.  Assessment & Plan:   Principal Problem:   Sepsis secondary to UTI [facility]) Active Problems:   Acute metabolic encephalopathy   Acute urinary retention   AKI (acute kidney injury) ([place])   Liver mass   Sepsis ([place])  1.UTI with acute metabolic encephalopathy and sepsis: Present on admission. Patient with hypotension, tachypnea, leukocytosis and endorgan damage including AKI and metabolic encephalopathy--meet sepsis criteria. Blood pressure appears to be improving after 2 L fluid bolus, Empiric antibiotics (will change Rocephin to cefepimeto cover Pseudomonas as well given recent Foley catheter)--Urine culture is Klebsiella-resistant to Amox/Unasyn/Macrobid-will change antibiotics to Cefazolin  2.Acute renal failure:Could be a combination of obstructive uropathy as well as hypotension related prerenal effect/ATN. Patient's lab work in April  showed BUN 13 and creatinine 1.15 with GFR greater than 60. Serum creatinine improving. Monitor response to IV hydration. Avoid nephrotoxic agents.  3.Seizure disorder: Resume home medications.  4.Acute urinary retention: Now back on condom catheter. Resume Tamsulosin. Shown to do clean intermittent cath with Urology--may need supplies/home health  5. Acute normocytic anemia: unreliable historian. Per daughter , patient had "dark stool"in April. Stool guaic negative in ED. Will order Fe studies and monitor Hgb , transfuse as needed. No evidence of hematuria in foley.He never had a colonoscopy and explained to daughter he may need one at some point based on care goals. Improving today  6.Abnormal CT report: Although CT reported liver mass/possible cholangiocarcinoma-patient's labs currently show normal liver function. No evidence of hypoalbuminemia or elevated alkaline phosphatase,Will however need further work-up with MRCP vs. Liver biopsy when renal function improves and medically stable. Discussed with IR and for biopsy on Tuesday.  7. Prolonged Qtc: avoid QT prolonging agents. Keep potassium close to 4 and mag to 2.0  8. Functional decline/deconditioning:per daughter patient unable to live by himself safely over last few months with declining function and mental status. Will request PT evaluation for safe d/c planning. They have recommended SNF, I think the patient prefers home. Long discussion with son at bedside today around goals of care--the patient is most adamant about not wanting an in-dwelling catheter. Spoke with Urology, can do intermittent catheterization if needed, will likely need supplies. Another alternative is suprapubic catheter.   DVT prophylaxis: SCD/Compression stockings Code Status: Full code  Family Communication: Son at bedside Disposition Plan: SNF vs. other   Consultants:   Curbside Urology--advised to do intermittent caths at home, can place  suprapubic catheter if needed  IR  Procedures:  None  Antimicrobials: Anti-infectives (From  admission, onward)   Start     Dose/Rate Route Frequency Ordered Stop   08/17/19 1630  ceFEPIme (MAXIPIME) 2 g in sodium chloride 0.9 % 100 mL IVPB     2 g 200 mL/hr over 30 Minutes Intravenous Every 24 hours 08/17/19 1601     08/17/19 1330  cefTRIAXone (ROCEPHIN) 1 g in sodium chloride 0.9 % 100 mL IVPB     1 g 200 mL/hr over 30 Minutes Intravenous  Once 08/17/19 1327 08/17/19 1535       Subjective: Feels much better. Urine is clearing. Remains Afebrile. Urine culture is resulted.  Objective: Vitals:   08/18/19 0910 08/18/19 1443 08/18/19 2051 08/19/19 0428  BP: (!) 94/57 112/70 97/64 108/72  Pulse: 79 81 75 89  Resp: 16 14 20 16   Temp: 98.2 F (36.8 C) 98 F (36.7 C) 97.8 F (36.6 C) 98.7 F (37.1 C)  TempSrc: Oral Oral Oral Oral  SpO2: 98% 100% 100% 96%  Weight:      Height:        Intake/Output Summary (Last 24 hours) at 08/19/2019 1326 Last data filed at 08/19/2019 1157 Gross per 24 hour  Intake 3022.2 ml  Output 405 ml  Net 2617.2 ml   Filed Weights   08/17/19 1334  Weight: 67.6 kg    Examination:  General exam: Appears calm and comfortable  Respiratory system: Clear to auscultation. Respiratory effort normal. Cardiovascular system: S1 & S2 heard, RRR.  Gastrointestinal system: Abdomen is nondistended, soft and nontender.  Central nervous system: Alert and oriented. No focal neurological deficits. Extremities: Symmetric  Skin: No rashes Psychiatry: Judgement and insight appear normal. Mood & affect appropriate.     Data Reviewed: I have personally reviewed following labs and imaging studies  CBC: Recent Labs  Lab 08/17/19 1339 08/18/19 0359 08/19/19 0903  WBC 20.3* 23.6* 19.3*  NEUTROABS 16.9*  --   --   HGB 8.2* 7.7* 9.0*  HCT 25.6* 24.6* 28.2*  MCV 91.4 92.5 90.4  PLT 761* 722* [id number]*   Basic Metabolic Panel: Recent Labs  Lab 08/17/19 1339  08/17/19 1735 08/18/19 0359 08/19/19 0903  NA 136  --  135 139  K 3.9  --  4.3 3.7  CL 108  --  112* 116*  CO2 19*  --  15* 17*  GLUCOSE 123*  --  105* 104*  BUN 45*  --  41* 34*  CREATININE 2.44*  --  2.33* 1.60*  CALCIUM 7.5*  --  7.5* 8.0*  MG  --  1.9  --   --    GFR: Estimated Creatinine Clearance: 39.9 mL/min (A) (by C-G formula based on SCr of 1.6 mg/dL (H)). Liver Function Tests: Recent Labs  Lab 08/17/19 1339 08/19/19 0903  AST 11* 16  ALT 13 14  ALKPHOS 61 72  BILITOT 0.3 0.4  PROT 5.7* 6.0*  ALBUMIN 2.1* 2.1*   Coagulation Profile: Recent Labs  Lab 08/17/19 1339  INR 1.3*   Thyroid Function Tests: Recent Labs    08/17/19 1339  TSH 1.920   Anemia Panel: Recent Labs    08/17/19 1851 08/18/19 1617  VITAMINB12 438  --   FOLATE  --  6.6  FERRITIN 267  --   TIBC 138*  --   IRON 8*  --    Sepsis Labs: Recent Labs  Lab 08/17/19 1323  LATICACIDVEN 1.3    Recent Results (from the past 240 hour(s))  Culture, blood (Routine x 2)     Status:  None (Preliminary result)   Collection Time: 08/17/19  1:23 PM   Specimen: BLOOD RIGHT FOREARM  Result Value Ref Range Status   Specimen Description   Final    BLOOD RIGHT FOREARM Performed at [facility] [address], [place], [place] [address]    Special Requests   Final    BOTTLES DRAWN AEROBIC ONLY Blood Culture results may not be optimal due to an excessive volume of blood received in culture bottles Performed at [facility] [address], [place], [place] [address]    Culture   Final    NO GROWTH 2 DAYS Performed at [facility] [address] [place], [place] [address]    Report Status PENDING  Incomplete  Urine culture     Status: Abnormal   Collection Time: 08/17/19  1:26 PM   Specimen: Urine, Clean Catch  Result Value Ref Range Status   Specimen Description   Final    URINE, CLEAN CATCH Performed at [facility], [place]  [address], [place], [place] [address]    Special Requests   Final    NONE Performed at [facility], [place] [address], [place], [place] [address]    Culture >=100,000 COLONIES/mL KLEBSIELLA OXYTOCA (A)  Final   Report Status 08/19/2019 FINAL  Final   Organism ID, Bacteria KLEBSIELLA OXYTOCA (A)  Final      Susceptibility   Klebsiella oxytoca - MIC*    AMPICILLIN >=32 RESISTANT Resistant     CEFAZOLIN 16 SENSITIVE Sensitive     CEFTRIAXONE <=1 SENSITIVE Sensitive     CIPROFLOXACIN <=0.25 SENSITIVE Sensitive     GENTAMICIN <=1 SENSITIVE Sensitive     IMIPENEM <=0.25 SENSITIVE Sensitive     NITROFURANTOIN 64 INTERMEDIATE Intermediate     TRIMETH/SULFA <=20 SENSITIVE Sensitive     AMPICILLIN/SULBACTAM 16 INTERMEDIATE Intermediate     PIP/TAZO <=4 SENSITIVE Sensitive     * >=100,000 COLONIES/mL KLEBSIELLA OXYTOCA  Culture, blood (Routine x 2)     Status: None (Preliminary result)   Collection Time: 08/17/19  1:28 PM   Specimen: BLOOD  Result Value Ref Range Status   Specimen Description   Final    BLOOD LEFT ANTECUBITAL Performed at [facility] [address] [place], [place] [address]    Special Requests   Final    BOTTLES DRAWN AEROBIC ONLY Blood Culture results may not be optimal due to an excessive volume of blood received in culture bottles Performed at [facility] [address], [place], [place] [address]    Culture   Final    NO GROWTH 2 DAYS Performed at [facility] [address] [place],  [address]    Report Status PENDING  Incomplete  SARS Coronavirus 2 by RT PCR (hospital order, performed in [facility] hospital lab) Nasopharyngeal Nasopharyngeal Swab     Status: None   Collection Time: 08/17/19  1:28 PM   Specimen: Nasopharyngeal Swab  Result Value Ref Range Status   SARS Coronavirus 2 NEGATIVE NEGATIVE Final    Comment: (NOTE) SARS-CoV-2 target nucleic acids are NOT DETECTED. The  SARS-CoV-2 RNA is generally detectable in upper and lower respiratory specimens during the acute phase of infection. The lowest concentration of SARS-CoV-2 viral copies this assay can detect is 250 copies / mL. A negative result does not preclude SARS-CoV-2 infection and should not be used as the sole basis for treatment or other patient management  decisions.  A negative result may occur with improper specimen collection / handling, submission of specimen other than nasopharyngeal swab, presence of viral mutation(s) within the areas targeted by this assay, and inadequate number of viral copies (<250 copies / mL). A negative result must be combined with clinical observations, patient history, and epidemiological information. Fact Sheet for Patients:   StrictlyIdeas.no Fact Sheet for Healthcare Providers: BankingDealers.co.za This test is not yet approved or cleared  by the [place] FDA and has been authorized for detection and/or diagnosis of SARS-CoV-2 by FDA under an Emergency Use Authorization (EUA).  This EUA will remain in effect (meaning this test can be used) for the duration of the COVID-19 declaration under Section 564(b)(1) of the Act, 21 U.S.C. section 360bbb-3(b)(1), unless the authorization is terminated or revoked sooner. Performed at [facility], [place] [address], [place], [place] [address]       Radiology Studies: DG Chest 2 View  Result Date: 08/17/2019 CLINICAL DATA:  UTI symptoms diaphoretic EXAM: CHEST - 2 VIEW COMPARISON:  05/07/2006 FINDINGS: The heart size and mediastinal contours are within normal limits. Mild aortic atherosclerosis. Both lungs are clear. The visualized skeletal structures are unremarkable. IMPRESSION: No active cardiopulmonary disease. Electronically Signed   By: Donavan Foil M.D.   On: 08/17/2019 15:31     Scheduled Meds: . feeding supplement (ENSURE ENLIVE)  237 mL Oral  BID BM  . multivitamin with minerals  1 tablet Oral Daily  . OXcarbazepine  300 mg Oral Daily  . OXcarbazepine  600 mg Oral QHS  . tamsulosin  0.4 mg Oral Daily   Continuous Infusions: . sodium chloride 150 mL/hr at 08/19/19 1133  . ceFEPime (MAXIPIME) IV Stopped (08/18/19 1643)     LOS: 2 days    Donnamae Jude, MD 08/19/2019 1:26 PM [phone] [facility] If 7PM-7AM, please contact night-coverage 08/19/2019, 1:26 PM

## 2019-08-20 LAB — COMPREHENSIVE METABOLIC PANEL WITH GFR
ALT: 18 U/L (ref 0–44)
AST: 23 U/L (ref 15–41)
Albumin: 2 g/dL — ABNORMAL LOW (ref 3.5–5.0)
Alkaline Phosphatase: 63 U/L (ref 38–126)
Anion gap: 8 (ref 5–15)
BUN: 29 mg/dL — ABNORMAL HIGH (ref 8–23)
CO2: 16 mmol/L — ABNORMAL LOW (ref 22–32)
Calcium: 8.1 mg/dL — ABNORMAL LOW (ref 8.9–10.3)
Chloride: 115 mmol/L — ABNORMAL HIGH (ref 98–111)
Creatinine, Ser: 1.65 mg/dL — ABNORMAL HIGH (ref 0.61–1.24)
GFR calc Af Amer: 47 mL/min — ABNORMAL LOW
GFR calc non Af Amer: 41 mL/min — ABNORMAL LOW
Glucose, Bld: 89 mg/dL (ref 70–99)
Potassium: 4 mmol/L (ref 3.5–5.1)
Sodium: 139 mmol/L (ref 135–145)
Total Bilirubin: 0.3 mg/dL (ref 0.3–1.2)
Total Protein: 5.9 g/dL — ABNORMAL LOW (ref 6.5–8.1)

## 2019-08-20 LAB — CBC
HCT: 27.6 % — ABNORMAL LOW (ref 39.0–52.0)
Hemoglobin: 9 g/dL — ABNORMAL LOW (ref 13.0–17.0)
MCH: 29 pg (ref 26.0–34.0)
MCHC: 32.6 g/dL (ref 30.0–36.0)
MCV: 89 fL (ref 80.0–100.0)
Platelets: 742 10*3/uL — ABNORMAL HIGH (ref 150–400)
RBC: 3.1 MIL/uL — ABNORMAL LOW (ref 4.22–5.81)
RDW: 16.3 % — ABNORMAL HIGH (ref 11.5–15.5)
WBC: 17.3 10*3/uL — ABNORMAL HIGH (ref 4.0–10.5)
nRBC: 0 % (ref 0.0–0.2)

## 2019-08-20 MED ORDER — CEFAZOLIN SODIUM-DEXTROSE 1-4 GM/50ML-% IV SOLN
1.0000 g | Freq: Three times a day (TID) | INTRAVENOUS | Status: DC
  Administered 2019-08-20 – 2019-08-21 (×2): 1 g via INTRAVENOUS
  Filled 2019-08-20 (×4): qty 50

## 2019-08-20 NOTE — Progress Notes (Signed)
PHARMACY NOTE:  ANTIMICROBIAL RENAL DOSAGE ADJUSTMENT  Current antimicrobial regimen includes a mismatch between antimicrobial dosage and estimated renal function.  As per policy approved by the Pharmacy & Therapeutics and Medical Executive Committees, the antimicrobial dosage will be adjusted accordingly.  Current antimicrobial dosage:  Ancef 1g IV q12  Indication: UTI  Renal Function:  Estimated Creatinine Clearance: 38.7 mL/min (A) (by C-G formula based on SCr of 1.65 mg/dL (H)). []      On intermittent HD, scheduled: []      On CRRT    Antimicrobial dosage has been changed to:  Ancef 1g IV q8  Additional comments:   Thank you for allowing pharmacy to be a part of this patient's care.  Kara Mead, [facility] 08/20/2019 12:24 PM

## 2019-08-20 NOTE — Progress Notes (Signed)
Patient ID: Mason Blackburn, male   DOB: Oct 28, 1945, 74 y.o.   MRN: [id number]  PROGRESS NOTE    Mason Blackburn  [id number] DOB: [birth year] DOA: 08/17/2019 PCP: Patient, No Pcp Per    Brief Narrative:  Mason Blackburn a 74 y.o.malewith history h/oseizures who follows [facility] neurology brought in to ED in concern for altered mental status as noted by daughter this morning. Patient was seen in ED on 06/30/2019 for abdominal symptoms and noted to have urinary retention/bladder distention along with bilateral mild hydronephrosis and colonic stool burden. CT also reported "4.5 x 2.5 cm irregular hypoenhancing area in the central liver concerning for a malignancy, possibly a cholangiocarcinoma" at that time and MRCP was recommended but patient never followed up. He however was seen for follow-up by urology, Foley catheter was discontinued but advised to be placed back as he did not pass voiding trial. Patient apparently was taught intermittent catheterization and sent home. Over the last week he has had fevers and general decline in condition. He became more confused when daughter visited him which prompted ED visit.   Assessment & Plan:   Principal Problem:   Sepsis secondary to UTI [facility]) Active Problems:   Acute metabolic encephalopathy   Acute urinary retention   AKI (acute kidney injury) ([place])   Liver mass   Sepsis ([place])  1.UTI with acute metabolic encephalopathy and sepsis: Present on admission. Patient with hypotension, tachypnea, leukocytosis and endorgan damage including AKI and metabolic encephalopathy--meet sepsis criteria. Blood pressure appears to be improving after 2 L fluid bolus, Empiric antibiotics (will change Rocephin to cefepimeto cover Pseudomonas as well given recent Foley catheter)--Urine culture is Klebsiella-resistant to Amox/Unasyn/Macrobid-will change antibiotics to Cefazolin--renally dosed per pharmacy  2.Acute renal failure:Could be a combination  of obstructive uropathy as well as hypotension related prerenal effect/ATN. Patient's lab work in April showed BUN 13 and creatinine 1.15 with GFR greater than 60. Serum creatinine improving. Monitor response to IV hydration. Avoid nephrotoxic agents. Worsening mildly today, but obstructed again today  3.Seizure disorder: Resume home medications.  4.Acute urinary retention:  Resume Tamsulosin. Shown to do clean intermittent cath with Urology--he refuses to cath, for suprapubic in am.  5. Acute normocytic anemia: unreliable historian. Per daughter , patient had "dark stool"in April. Stool guaic negative in ED. Will order Fe studies and monitor Hgb , transfuse as needed. No evidence of hematuria in foley.He never had a colonoscopy and explained to daughter he may need one at some point based on care goals. Improving to 9.0--stable  6.Abnormal CT report: Although CT reported liver mass/possible cholangiocarcinoma-patient's labs currently show normal liver function. No evidence of hypoalbuminemia or elevated alkaline phosphatase,Will however need further work-up with MRCP vs. Liver biopsy when renal function improves and medically stable. Discussed with IR and for biopsy on Tuesday.  7. Prolonged Qtc: avoid QT prolonging agents. Keep potassium close to 4 and mag to 2.0  8. Functional decline/deconditioning:per daughter patient unable to live by himself safely over last few months with declining function and mental status. Will request PT evaluation for safe d/c planning.They have recommended SNF, I think the patient prefers home. Long discussion with son around goals of care--the patient is most adamant about not wanting an in-dwelling catheter. Spoke with Urology, can do intermittent catheterization if needed, will likely need supplies. Another alternative is suprapubic catheter.--scheduled with IR in the am.   DVT prophylaxis: SCD/Compression stockings Code Status: Full code    Family Communication:  Disposition Plan: SNF vs. other  Consultants:   Urology  IR  Procedures:  None  Antimicrobials: Anti-infectives (From admission, onward)   Start     Dose/Rate Route Frequency Ordered Stop   08/20/19 2200  ceFAZolin (ANCEF) IVPB 1 g/50 mL premix     1 g 100 mL/hr over 30 Minutes Intravenous Every 8 hours 08/20/19 1225     08/19/19 2000  ceFAZolin (ANCEF) IVPB 1 g/50 mL premix  Status:  Discontinued     1 g 100 mL/hr over 30 Minutes Intravenous Every 12 hours 08/19/19 1332 08/20/19 1225   08/17/19 1630  ceFEPIme (MAXIPIME) 2 g in sodium chloride 0.9 % 100 mL IVPB  Status:  Discontinued     2 g 200 mL/hr over 30 Minutes Intravenous Every 24 hours 08/17/19 1601 08/19/19 1332   08/17/19 1330  cefTRIAXone (ROCEPHIN) 1 g in sodium chloride 0.9 % 100 mL IVPB     1 g 200 mL/hr over 30 Minutes Intravenous  Once 08/17/19 1327 08/17/19 1535       Subjective: Still cannot pee. Refuses adamantly an indwelling catheter.  Objective: Vitals:   08/19/19 1359 08/19/19 2046 08/20/19 0517 08/20/19 1346  BP: 105/78 (!) 86/54 110/76 114/79  Pulse: 73 85 75 72  Resp: 16 18 16    Temp: (!) 97.4 F (36.3 C) 98.1 F (36.7 C) 98.2 F (36.8 C) 98.2 F (36.8 C)  TempSrc: Oral Oral Oral Oral  SpO2: 100% 99% 96% 100%  Weight:      Height:        Intake/Output Summary (Last 24 hours) at 08/20/2019 1541 Last data filed at 08/20/2019 1342 Gross per 24 hour  Intake 2848.74 ml  Output 1125 ml  Net 1723.74 ml   Filed Weights   08/17/19 1334  Weight: 67.6 kg    Examination:  General exam: Appears calm and comfortable  Respiratory system: Clear to auscultation. Respiratory effort normal. Cardiovascular system: S1 & S2 heard, RRR.  Gastrointestinal system: Abdomen is distended, soft and nontender.  Central nervous system: Alert and oriented. No focal neurological deficits. Extremities: Symmetric  Skin: No rashes Psychiatry: Judgement and insight appear normal.  Mood & affect appropriate.   Data Reviewed: I have personally reviewed following labs and imaging studies  CBC: Recent Labs  Lab 08/17/19 1339 08/18/19 0359 08/19/19 0903 08/20/19 0307  WBC 20.3* 23.6* 19.3* 17.3*  NEUTROABS 16.9*  --   --   --   HGB 8.2* 7.7* 9.0* 9.0*  HCT 25.6* 24.6* 28.2* 27.6*  MCV 91.4 92.5 90.4 89.0  PLT 761* 722* 811* 0000000*   Basic Metabolic Panel: Recent Labs  Lab 08/17/19 1339 08/17/19 1735 08/18/19 0359 08/19/19 0903 08/20/19 0307  NA 136  --  135 139 139  K 3.9  --  4.3 3.7 4.0  CL 108  --  112* 116* 115*  CO2 19*  --  15* 17* 16*  GLUCOSE 123*  --  105* 104* 89  BUN 45*  --  41* 34* 29*  CREATININE 2.44*  --  2.33* 1.60* 1.65*  CALCIUM 7.5*  --  7.5* 8.0* 8.1*  MG  --  1.9  --   --   --    GFR: Estimated Creatinine Clearance: 38.7 mL/min (A) (by C-G formula based on SCr of 1.65 mg/dL (H)). Liver Function Tests: Recent Labs  Lab 08/17/19 1339 08/19/19 0903 08/20/19 0307  AST 11* 16 23  ALT 13 14 18   ALKPHOS 61 72 63  BILITOT 0.3 0.4 0.3  PROT 5.7* 6.0* 5.9*  ALBUMIN 2.1* 2.1* 2.0*   Coagulation Profile: Recent Labs  Lab 08/17/19 1339  INR 1.3*   Anemia Panel: Recent Labs    08/17/19 1851 08/18/19 1617  VITAMINB12 438  --   FOLATE  --  6.6  FERRITIN 267  --   TIBC 138*  --   IRON 8*  --    Sepsis Labs: Recent Labs  Lab 08/17/19 1323  LATICACIDVEN 1.3    Recent Results (from the past 240 hour(s))  Culture, blood (Routine x 2)     Status: None (Preliminary result)   Collection Time: 08/17/19  1:23 PM   Specimen: BLOOD RIGHT FOREARM  Result Value Ref Range Status   Specimen Description   Final    BLOOD RIGHT FOREARM Performed at [facility] [address], [place], [place] [address]    Special Requests   Final    BOTTLES DRAWN AEROBIC ONLY Blood Culture results may not be optimal due to an excessive volume of blood received in culture bottles Performed at [facility] [address], [place], [place] [address]    Culture   Final    NO GROWTH 3 DAYS Performed at [facility], [place] [address], [place], [place] [address]    Report Status PENDING  Incomplete  Urine culture     Status: Abnormal   Collection Time: 08/17/19  1:26 PM   Specimen: Urine, Clean Catch  Result Value Ref Range Status   Specimen Description   Final    URINE, CLEAN CATCH Performed at [facility], [place] [address], [place], [place] [address]    Special Requests   Final    NONE Performed at [facility], [place] [address], [place], [place] [address]    Culture >=100,000 COLONIES/mL KLEBSIELLA OXYTOCA (A)  Final   Report Status 08/19/2019 FINAL  Final   Organism ID, Bacteria KLEBSIELLA OXYTOCA (A)  Final      Susceptibility   Klebsiella oxytoca - MIC*    AMPICILLIN >=32 RESISTANT Resistant     CEFAZOLIN 16 SENSITIVE Sensitive     CEFTRIAXONE <=1 SENSITIVE Sensitive     CIPROFLOXACIN <=0.25 SENSITIVE Sensitive     GENTAMICIN <=1 SENSITIVE Sensitive     IMIPENEM <=0.25 SENSITIVE Sensitive     NITROFURANTOIN 64 INTERMEDIATE Intermediate     TRIMETH/SULFA <=20 SENSITIVE Sensitive     AMPICILLIN/SULBACTAM 16 INTERMEDIATE Intermediate     PIP/TAZO <=4 SENSITIVE Sensitive     * >=100,000 COLONIES/mL KLEBSIELLA OXYTOCA  Culture, blood (Routine x 2)     Status: None (Preliminary result)   Collection Time: 08/17/19  1:28 PM   Specimen: BLOOD  Result Value Ref Range Status   Specimen Description   Final    BLOOD LEFT ANTECUBITAL Performed at [facility] [address] [place], [place] [address]    Special Requests   Final    BOTTLES DRAWN AEROBIC ONLY Blood Culture results may not be optimal due to an excessive volume of blood received in culture bottles Performed at [place] [address], [place], [place] [address]    Culture   Final    NO GROWTH 3 DAYS Performed at [facility],  [place] [address], [place],  [address]    Report Status PENDING  Incomplete  SARS Coronavirus 2 by RT PCR ([facility] order, performed in [facility]) Nasopharyngeal Nasopharyngeal Swab     Status: None  Collection Time: 08/17/19  1:28 PM   Specimen: Nasopharyngeal Swab  Result Value Ref Range Status   SARS Coronavirus 2 NEGATIVE NEGATIVE Final    Comment: (NOTE) SARS-CoV-2 target nucleic acids are NOT DETECTED. The SARS-CoV-2 RNA is generally detectable in upper and lower respiratory specimens during the acute phase of infection. The lowest concentration of SARS-CoV-2 viral copies this assay can detect is 250 copies / mL. A negative result does not preclude SARS-CoV-2 infection and should not be used as the sole basis for treatment or other patient management decisions.  A negative result may occur with improper specimen collection / handling, submission of specimen other than nasopharyngeal swab, presence of viral mutation(s) within the areas targeted by this assay, and inadequate number of viral copies (<250 copies / mL). A negative result must be combined with clinical observations, patient history, and epidemiological information. Fact Sheet for Patients:   StrictlyIdeas.no Fact Sheet for Healthcare Providers: BankingDealers.co.za This test is not yet approved or cleared  by the [place] FDA and has been authorized for detection and/or diagnosis of SARS-CoV-2 by FDA under an Emergency Use Authorization (EUA).  This EUA will remain in effect (meaning this test can be used) for the duration of the COVID-19 declaration under Section 564(b)(1) of the Act, 21 U.S.C. section 360bbb-3(b)(1), unless the authorization is terminated or revoked sooner. Performed at [facility], [place] [address] [place], [place] [address]       Radiology Studies: US Abdomen Limited RUQ  Result Date: 08/19/2019 CLINICAL  DATA:  74 year old male with liver mass. EXAM: ULTRASOUND ABDOMEN LIMITED RIGHT UPPER QUADRANT COMPARISON:  CT abdomen pelvis dated 06/30/2019. FINDINGS: Gallbladder: The gallbladder is contracted. Echogenic focus with comet tail artifact along the gallbladder wall most consistent with adenomyomatosis. No gallstone, pericholecystic fluid, or other sonographic findings of acute cholecystitis. Negative sonographic Murphy's sign. Common bile duct: Diameter: 8 mm. There is mild intrahepatic biliary ductal dilatation. Liver: Heterogeneous liver echotexture. There is a 3.6 x 4.0 x 4.8 cm hypoechoic mass in the central liver corresponding to the lesion seen on the prior CT and concerning for malignancy. Further evaluation with MRI without and with contrast is recommended if not previously performed. Portal vein is patent on color Doppler imaging with normal direction of blood flow towards the liver. Other: Small ascites, new since the CT. IMPRESSION: 1. Heterogeneous liver with a hypoechoic mass centrally concerning for malignancy. Further characterization with MRI without and with contrast if not previously performed is recommended. 2. Mild intrahepatic biliary ductal dilatation. 3. No gallstone or sonographic evidence of acute cholecystitis. Contracted gallbladder with small adenomyomatosis. 4. Small ascites, new since the prior CT. Electronically Signed   By: Anner Crete M.D.   On: 08/19/2019 18:16     Scheduled Meds: . feeding supplement (ENSURE ENLIVE)  237 mL Oral BID BM  . multivitamin with minerals  1 tablet Oral Daily  . OXcarbazepine  300 mg Oral Daily  . OXcarbazepine  600 mg Oral QHS  . tamsulosin  0.4 mg Oral Daily   Continuous Infusions: . sodium chloride 150 mL/hr at 08/20/19 1339  .  ceFAZolin (ANCEF) IV       LOS: 3 days    Donnamae Jude, MD 08/20/2019 3:41 PM [phone] [facility] If 7PM-7AM, please contact night-coverage 08/20/2019, 3:41 PM

## 2019-08-20 NOTE — Consult Note (Signed)
I have been asked to see the patient by Dr. Darron Doom, for evaluation and management of urinary retention.  History of present illness: 74 year old male with history of urinary retention who was admitted to the hospital several days ago altered mental status severe urinary tract infection.  He was admitted to the hospital about 6 weeks ago at which point he had demonstrated a very distended bladder.  Foley catheter was placed at that time.  He followed up at [facility] urology.  Voiding trial, which he failed.  At that time we recommended that he have his catheter replaced, but he refused, and instead was taught clean intermittent catheterization.  He was given close follow-up, and he no showed his appointment.  Since that time he has not been cathing himself at all.  Continues to have urinary frequency and incontinence.  Ultimately, he progressed to having severe mental status proximal to the emergency department by his daughter.  Since the patient's been admitted he was managed with a condom catheter.  This was ineffective his cath being removed.  Overnight and into the day the patient was unable to really 25 cc at a time and complaining of a full bladder.  At the time of my interview with the patient the nursing staff was passing in and out catheter.  Approximately 650 cc of cloudy urine was returned.  After the patient's bladder was empty he felt significantly better.  Review of systems: A 12 point comprehensive review of systems was obtained and is negative unless otherwise stated in the history of present illness.  Patient Active Problem List   Diagnosis Date Noted  . Sepsis secondary to UTI ([place]) 08/17/2019  . Acute metabolic encephalopathy [id number]  . Acute urinary retention 08/17/2019  . AKI (acute kidney injury) ([place]) 08/17/2019  . Liver mass 08/17/2019  . Sepsis ([place]) 08/17/2019  . ANXIETY DISORDER, GENERALIZED 06/02/2006  . ABUSE, ALCOHOL, IN REMISSION 06/02/2006  . TOBACCO ABUSE  06/02/2006  . VISUAL HALLUCINATION 06/02/2006  . PARASOMNIA 06/02/2006    No current facility-administered medications on file prior to encounter.   Current Outpatient Medications on File Prior to Encounter  Medication Sig Dispense Refill  . ibuprofen (ADVIL) 200 MG tablet Take 200 mg by mouth every 6 (six) hours as needed for fever.    . OXcarbazepine (TRILEPTAL) 300 MG/5ML suspension Take 300-600 mg by mouth 2 (two) times daily. Take 300 mg (5 ml) in the morning and Take 600 mg (10 ml) in the evening    . tamsulosin (FLOMAX) 0.4 MG CAPS capsule Take 0.4 mg by mouth every evening.       Past Medical History:  Diagnosis Date  . Seizures ([place])     History reviewed. No pertinent surgical history.  Social History   Tobacco Use  . Smoking status: Current Every Day Smoker  . Smokeless tobacco: Never Used  Substance Use Topics  . Alcohol use: Not Currently  . Drug use: Never    History reviewed. No pertinent family history.  PE: Vitals:   08/19/19 0428 08/19/19 1359 08/19/19 2046 08/20/19 0517  BP: 108/72 105/78 (!) 86/54 110/76  Pulse: 89 73 85 75  Resp: 16 16 18 16   Temp: 98.7 F (37.1 C) (!) 97.4 F (36.3 C) 98.1 F (36.7 C) 98.2 F (36.8 C)  TempSrc: Oral Oral Oral Oral  SpO2: 96% 100% 99% 96%  Weight:      Height:       Patient appears to be in no acute distress  patient is alert and oriented x3 Atraumatic normocephalic head No cervical or supraclavicular lymphadenopathy appreciated No increased work of breathing, no audible wheezes/rhonchi Regular sinus rhythm/rate Abdomen is soft, nontender, nondistended, no CVA or suprapubic tenderness Lower extremities are symmetric without appreciable edema Grossly neurologically intact No identifiable skin lesions  Recent Labs    08/18/19 0359 08/19/19 0903 08/20/19 0307  WBC 23.6* 19.3* 17.3*  HGB 7.7* 9.0* 9.0*  HCT 24.6* 28.2* 27.6*   Recent Labs    08/18/19 0359 08/19/19 0903 08/20/19 0307  NA 135 139  139  K 4.3 3.7 4.0  CL 112* 116* 115*  CO2 15* 17* 16*  GLUCOSE 105* 104* 89  BUN 41* 34* 29*  CREATININE 2.33* 1.60* 1.65*  CALCIUM 7.5* 8.0* 8.1*   Recent Labs    08/17/19 1339  INR 1.3*   No results for input(s): LABURIN in the last 72 hours. Results for orders placed or performed during the hospital encounter of 08/17/19  Culture, blood (Routine x 2)     Status: None (Preliminary result)   Collection Time: 08/17/19  1:23 PM   Specimen: BLOOD RIGHT FOREARM  Result Value Ref Range Status   Specimen Description   Final    BLOOD RIGHT FOREARM Performed at [facility] [address], [place], [place] [address]    Special Requests   Final    BOTTLES DRAWN AEROBIC ONLY Blood Culture results may not be optimal due to an excessive volume of blood received in culture bottles Performed at [facility] [address], [place], [place] [address]    Culture   Final    NO GROWTH 3 DAYS Performed at [facility], [place] [address] [place], [place] [address]    Report Status PENDING  Incomplete  Urine culture     Status: Abnormal   Collection Time: 08/17/19  1:26 PM   Specimen: Urine, Clean Catch  Result Value Ref Range Status   Specimen Description   Final    URINE, CLEAN CATCH Performed at [facility], [place] [address] [place], [place] [address]    Special Requests   Final    NONE Performed at [facility], [place] [address], [place], [place] [address]    Culture >=100,000 COLONIES/mL KLEBSIELLA OXYTOCA (A)  Final   Report Status 08/19/2019 FINAL  Final   Organism ID, Bacteria KLEBSIELLA OXYTOCA (A)  Final      Susceptibility   Klebsiella oxytoca - MIC*    AMPICILLIN >=32 RESISTANT Resistant     CEFAZOLIN 16 SENSITIVE Sensitive     CEFTRIAXONE <=1 SENSITIVE Sensitive     CIPROFLOXACIN <=0.25 SENSITIVE Sensitive     GENTAMICIN <=1 SENSITIVE Sensitive     IMIPENEM <=0.25 SENSITIVE Sensitive      NITROFURANTOIN 64 INTERMEDIATE Intermediate     TRIMETH/SULFA <=20 SENSITIVE Sensitive     AMPICILLIN/SULBACTAM 16 INTERMEDIATE Intermediate     PIP/TAZO <=4 SENSITIVE Sensitive     * >=100,000 COLONIES/mL KLEBSIELLA OXYTOCA  Culture, blood (Routine x 2)     Status: None (Preliminary result)   Collection Time: 08/17/19  1:28 PM   Specimen: BLOOD  Result Value Ref Range Status   Specimen Description   Final    BLOOD LEFT ANTECUBITAL Performed at [facility] [address] [place],  [address]    Special Requests   Final    BOTTLES DRAWN AEROBIC ONLY Blood Culture results may not be optimal due to  an excessive volume of blood received in culture bottles Performed at [facility] [address] [place], [place] [address]    Culture   Final    NO GROWTH 3 DAYS Performed at [facility], [place] [address], [place], [place] [address]    Report Status PENDING  Incomplete  SARS Coronavirus 2 by RT PCR (hospital order, performed in [facility] hospital lab) Nasopharyngeal Nasopharyngeal Swab     Status: None   Collection Time: 08/17/19  1:28 PM   Specimen: Nasopharyngeal Swab  Result Value Ref Range Status   SARS Coronavirus 2 NEGATIVE NEGATIVE Final    Comment: (NOTE) SARS-CoV-2 target nucleic acids are NOT DETECTED. The SARS-CoV-2 RNA is generally detectable in upper and lower respiratory specimens during the acute phase of infection. The lowest concentration of SARS-CoV-2 viral copies this assay can detect is 250 copies / mL. A negative result does not preclude SARS-CoV-2 infection and should not be used as the sole basis for treatment or other patient management decisions.  A negative result may occur with improper specimen collection / handling, submission of specimen other than nasopharyngeal swab, presence of viral mutation(s) within the areas targeted by this assay, and inadequate number of viral copies (<250 copies / mL). A  negative result must be combined with clinical observations, patient history, and epidemiological information. Fact Sheet for Patients:   StrictlyIdeas.no Fact Sheet for Healthcare Providers: BankingDealers.co.za This test is not yet approved or cleared  by the [place] FDA and has been authorized for detection and/or diagnosis of SARS-CoV-2 by FDA under an Emergency Use Authorization (EUA).  This EUA will remain in effect (meaning this test can be used) for the duration of the COVID-19 declaration under Section 564(b)(1) of the Act, 21 U.S.C. section 360bbb-3(b)(1), unless the authorization is terminated or revoked sooner. Performed at [facility], [place] [address], [place], [place] [address]     Imaging: I reviewed the patient's CT scan from April which demonstrated bladder that extends up to the umbilicus and was approximately 20 cm in total height.  There was a small bladder stone in his bladder as well.  He had also a small diverticulum.  He had mild hydroureteronephrosis.  Imp: The patient has urinary retention and associated obstructive renal failure as well as a severe urinary tract infection.  The patient has been noncompliant with clean intermittent catheterizations.  He adamantly refuses a Foley catheter.  Yet, he is unable to void on his own.  Recommendations: The patient has a dysfunctional bladder and is unable to void on his own.  He has refused a Foley catheter and unwilling to perform CIC.  His only alternative at this point would be placement of suprapubic tube.  I spoke with the patient about his and we discussed all the alternatives.  I do not think that he would be able to void on his own enough to avoid doing something and empty his bladder.  As such, I recommend that the patient undergo placement of a suprapubic catheter.  I will then have him follow-up with [place] in our clinic in the coming weeks to  review his circumstance and discussed further management.  Thank you for involving me in this patient's care, Please page with any further questions or concerns. Ardis Hughs

## 2019-08-20 NOTE — TOC Initial Note (Signed)
Transition of Care [facility]) - Initial/Assessment Note    Patient Details  Name: Mason Blackburn MRN: [id number] Date of Birth: 02-29-1946  [facility]) CM/SW Contact:    Joaquin Courts, RN Phone Number: 08/20/2019, 10:20 AM  Clinical Narrative:  CM spoke with patient's daughter regarding PT recommendation for short term rehab at [facility].  Daughter reports patient lives alone but has previously declined going to SNF despite having difficulty taking care of himself at home.  Daughter reports that at this time, she is uncertain what the patient's disposition will be, they are still waiting for a biopsy to be completed and reports that MD has discussed that patient may need hospice services (uncertain if at home or residential).  At this time, daughter is unable to make disposition decisions until all diagnostic testing is complete and she has better understanding if there are treatment options or if her dad will need hospice.                   Expected Discharge Plan: (uncertain at this time) Barriers to Discharge: Continued Medical Work up   Patient Goals and CMS Choice Patient states their goals for this hospitalization and ongoing recovery are:: uncertain at this time, but there is a posibility of hospice per the daughter      Expected Discharge Plan and Services Expected Discharge Plan: (uncertain at this time)   Discharge Planning Services: CM Consult   Living arrangements for the past 2 months: Single Family Home                                      Prior Living Arrangements/Services Living arrangements for the past 2 months: Single Family Home Lives with:: Self Patient language and need for interpreter reviewed:: Yes Do you feel safe going back to the place where you live?: Yes      Need for Family Participation in Patient Care: Yes (Comment) Care giver support system in place?: Yes (comment)   Criminal Activity/Legal Involvement Pertinent to Current  Situation/Hospitalization: No - Comment as needed  Activities of Daily Living Home Assistive Devices/Equipment: None ADL Screening (condition at time of admission) Patient's cognitive ability adequate to safely complete daily activities?: No Is the patient deaf or have difficulty hearing?: No Does the patient have difficulty seeing, even when wearing glasses/contacts?: No Does the patient have difficulty concentrating, remembering, or making decisions?: Yes(trouble with memory) Patient able to express need for assistance with ADLs?: Yes Does the patient have difficulty dressing or bathing?: No Independently performs ADLs?: Yes (appropriate for developmental age) Does the patient have difficulty walking or climbing stairs?: Yes Weakness of Legs: Both Weakness of Arms/Hands: Both  Permission Sought/Granted                  Emotional Assessment           Psych Involvement: No (comment)  Admission diagnosis:  AKI (acute kidney injury) ([place]) [N17.9] Sepsis ([place]) [A41.9] Anemia, unspecified type [D64.9] Sepsis, due to unspecified organism, unspecified whether acute organ dysfunction present [facility]) [A41.9] Patient Active Problem List   Diagnosis Date Noted  . Sepsis secondary to UTI ([place]) 08/17/2019  . Acute metabolic encephalopathy [id number]  . Acute urinary retention 08/17/2019  . AKI (acute kidney injury) ([place]) 08/17/2019  . Liver mass 08/17/2019  . Sepsis ([place]) 08/17/2019  . ANXIETY DISORDER, GENERALIZED 06/02/2006  . ABUSE, ALCOHOL, IN REMISSION 06/02/2006  .  TOBACCO ABUSE 06/02/2006  . VISUAL HALLUCINATION 06/02/2006  . PARASOMNIA 06/02/2006   PCP:  Patient, No Pcp Per Pharmacy:  No Pharmacies Listed    Social Determinants of Health (SDOH) Interventions    Readmission Risk Interventions No flowsheet data found.

## 2019-08-21 ENCOUNTER — Inpatient Hospital Stay (HOSPITAL_COMMUNITY): Payer: Medicare Other

## 2019-08-21 ENCOUNTER — Encounter (HOSPITAL_COMMUNITY): Payer: Self-pay | Admitting: Internal Medicine

## 2019-08-21 LAB — COMPREHENSIVE METABOLIC PANEL
ALT: 13 U/L (ref 0–44)
AST: 14 U/L — ABNORMAL LOW (ref 15–41)
Albumin: 1.7 g/dL — ABNORMAL LOW (ref 3.5–5.0)
Alkaline Phosphatase: 51 U/L (ref 38–126)
Anion gap: 5 (ref 5–15)
BUN: 26 mg/dL — ABNORMAL HIGH (ref 8–23)
CO2: 18 mmol/L — ABNORMAL LOW (ref 22–32)
Calcium: 7.8 mg/dL — ABNORMAL LOW (ref 8.9–10.3)
Chloride: 119 mmol/L — ABNORMAL HIGH (ref 98–111)
Creatinine, Ser: 1.32 mg/dL — ABNORMAL HIGH (ref 0.61–1.24)
GFR calc Af Amer: >60 mL/min (ref >60)
GFR calc non Af Amer: 54 mL/min — ABNORMAL LOW (ref >60)
Glucose, Bld: 100 mg/dL — ABNORMAL HIGH (ref 70–99)
Potassium: 3.9 mmol/L (ref 3.5–5.1)
Sodium: 142 mmol/L (ref 135–145)
Total Bilirubin: 0.3 mg/dL (ref 0.3–1.2)
Total Protein: 4.9 g/dL — ABNORMAL LOW (ref 6.5–8.1)

## 2019-08-21 LAB — CBC
HCT: 23.6 % — ABNORMAL LOW (ref 39.0–52.0)
Hemoglobin: 7.4 g/dL — ABNORMAL LOW (ref 13.0–17.0)
MCH: 28.5 pg (ref 26.0–34.0)
MCHC: 31.4 g/dL (ref 30.0–36.0)
MCV: 90.8 fL (ref 80.0–100.0)
Platelets: 681 10*3/uL — ABNORMAL HIGH (ref 150–400)
RBC: 2.6 MIL/uL — ABNORMAL LOW (ref 4.22–5.81)
RDW: 16.3 % — ABNORMAL HIGH (ref 11.5–15.5)
WBC: 16.5 10*3/uL — ABNORMAL HIGH (ref 4.0–10.5)
nRBC: 0 % (ref 0.0–0.2)

## 2019-08-21 MED ORDER — MIDAZOLAM HCL 2 MG/2ML IJ SOLN
INTRAMUSCULAR | Status: AC | PRN
  Administered 2019-08-21 (×2): 1 mg via INTRAVENOUS

## 2019-08-21 MED ORDER — FENTANYL CITRATE (PF) 100 MCG/2ML IJ SOLN
INTRAMUSCULAR | Status: AC
  Filled 2019-08-21: qty 2

## 2019-08-21 MED ORDER — LIDOCAINE HCL (PF) 1 % IJ SOLN
INTRAMUSCULAR | Status: AC | PRN
  Administered 2019-08-21: 10 mL

## 2019-08-21 MED ORDER — FENTANYL CITRATE (PF) 100 MCG/2ML IJ SOLN
INTRAMUSCULAR | Status: AC | PRN
  Administered 2019-08-21 (×2): 50 ug via INTRAVENOUS

## 2019-08-21 MED ORDER — CEFAZOLIN SODIUM-DEXTROSE 2-4 GM/100ML-% IV SOLN
INTRAVENOUS | Status: AC
  Administered 2019-08-21: 2 g via INTRAVENOUS
  Filled 2019-08-21: qty 100

## 2019-08-21 MED ORDER — CEPHALEXIN 500 MG PO CAPS: 500.0000 mg | ORAL_CAPSULE | Freq: Two times a day (BID) | ORAL | 0 refills | Status: DC

## 2019-08-21 MED ORDER — SODIUM CHLORIDE 0.9% FLUSH
5.0000 mL | Freq: Three times a day (TID) | INTRAVENOUS | Status: DC
  Administered 2019-08-21 – 2019-08-22 (×4): 5 mL

## 2019-08-21 MED ORDER — MIDAZOLAM HCL 2 MG/2ML IJ SOLN
INTRAMUSCULAR | Status: AC
  Filled 2019-08-21: qty 4

## 2019-08-21 MED ORDER — CEPHALEXIN 500 MG PO CAPS
500.0000 mg | ORAL_CAPSULE | Freq: Two times a day (BID) | ORAL | Status: DC
  Administered 2019-08-21 – 2019-08-22 (×2): 500 mg via ORAL
  Filled 2019-08-21 (×2): qty 1

## 2019-08-21 MED ORDER — GADOBUTROL 1 MMOL/ML IV SOLN
6.0000 mL | Freq: Once | INTRAVENOUS | Status: AC | PRN
  Administered 2019-08-21: 6 mL via INTRAVENOUS

## 2019-08-21 MED ORDER — OXCARBAZEPINE 300 MG/5ML PO SUSP: 300.0000 mg | Freq: Two times a day (BID) | ORAL | 12 refills | Status: DC

## 2019-08-21 MED ORDER — CEFAZOLIN SODIUM-DEXTROSE 2-4 GM/100ML-% IV SOLN: 2.0000 g | Freq: Once | INTRAVENOUS | Status: AC

## 2019-08-21 NOTE — Procedures (Signed)
Interventional Radiology Procedure Note  Procedure: CT guided placement of a 56F suprapubic catheter.   Complications: None  Estimated Blood Loss: None  Recommendations: - Bag to gravity - Exchange and upsize in 4-6 weeks  Signed,  Criselda Peaches, MD

## 2019-08-21 NOTE — Progress Notes (Signed)
Physical Therapy Treatment Patient Details Name: Mason Blackburn MRN: [id number] DOB: [birth year] Today's Date: 08/21/2019    History of Present Illness Pt is 74 yo male with PMH of seizures.  Patient was seen in ED on 06/30/2019 for abdominal symptoms and noted to have urinary retention/bladder distention along with bilateral mild hydronephrosis and colonic stool burden.   Foley catheter was recomend but pt refused and went home.  Over the last week pt with decline in condition and increased confusion.  Pt now admitted with sepsis and metabolic encephalopathy from UTI>  Pt pending suprapubic catheter placement 08/21/19    PT Comments    Pt pleasant and agreeable to PT.  Pt ambulated in hallway without assistive device and appears very unsteady however without LOB or assist required.  Pt returned to room and performed standing exercises.  Pt lives alone so would recommend increased care at home or pt may need SNF upon d/c. Pt also to have suprapubic catheter placed today.    Follow Up Recommendations  SNF;Supervision/Assistance - 24 hour     Equipment Recommendations  Rolling walker with 5" wheels    Recommendations for Other Services       Precautions / Restrictions Precautions Precautions: Fall Restrictions Weight Bearing Restrictions: No    Mobility  Bed Mobility Overal bed mobility: Modified Independent             General bed mobility comments: pt in recliner on arrival  Transfers Overall transfer level: Needs assistance Equipment used: None Transfers: Sit to/from Stand Sit to Stand: Min guard         General transfer comment: min/guard for safety, cues for hand placement  Ambulation/Gait Ambulation/Gait assistance: Min guard Gait Distance (Feet): 400 Feet Assistive device: None Gait Pattern/deviations: Step-through pattern;Narrow base of support;Drifts right/left;Antalgic     General Gait Details: pt with unsteady gait however no physical assist or LOB  observed   Stairs             Wheelchair Mobility    Modified Rankin (Stroke Patients Only)       Balance Overall balance assessment: Needs assistance Sitting-balance support: No upper extremity supported;Feet supported Sitting balance-Leahy Scale: Good     Standing balance support: No upper extremity supported Standing balance-Leahy Scale: Fair Standing balance comment: Pt able to stand for toileting and LB ADLs but swaying and requiring close guarding for safety                            Cognition Arousal/Alertness: Awake/alert Behavior During Therapy: Impulsive Overall Cognitive Status: Impaired/Different from baseline Area of Impairment: Orientation;Safety/judgement;Memory                 Orientation Level: Disoriented to;Time;Situation(for place states "cone")   Memory: Decreased short-term memory Following Commands: Follows one step commands with increased time Safety/Judgement: Decreased awareness of safety;Decreased awareness of deficits   Problem Solving: Slow processing        Exercises Total Joint Exercises Knee Flexion: AROM;Standing;10 reps;Both(all standing exercises performed at sink for UE support) General Exercises - Lower Extremity Hip ABduction/ADduction: AROM;Standing;Both;10 reps Heel Raises: AROM;Both;10 reps;Standing    General Comments        Pertinent Vitals/Pain Pain Assessment: No/denies pain    Home Living                      Prior Function            PT Goals (  current goals can now be found in the care plan section) Acute Rehab PT Goals Patient Stated Goal: to get out of the hospital Progress towards PT goals: Progressing toward goals    Frequency    Min 2X/week      PT Plan Current plan remains appropriate    Co-evaluation              AM-PAC PT "6 Clicks" Mobility   Outcome Measure  Help needed turning from your back to your side while in a flat bed without using  bedrails?: None Help needed moving from lying on your back to sitting on the side of a flat bed without using bedrails?: None Help needed moving to and from a bed to a chair (including a wheelchair)?: A Little Help needed standing up from a chair using your arms (e.g., wheelchair or bedside chair)?: A Little Help needed to walk in hospital room?: A Little Help needed climbing 3-5 steps with a railing? : A Lot 6 Click Score: 19    End of Session Equipment Utilized During Treatment: Gait belt Activity Tolerance: Patient tolerated treatment well Patient left: in chair;with chair alarm set;with call bell/phone within reach   PT Visit Diagnosis: Unsteadiness on feet (R26.81)     Time: [id number] PT Time Calculation (min) (ACUTE ONLY): 11 min  Charges:  $Gait Training: 8-22 mins                     08/21/2019 Jannette Spanner PT, DPT Acute Rehabilitation Services Office: [phone]  LEMYRE,KATHrine E 08/21/2019, 4:00 PM

## 2019-08-21 NOTE — Care Management Important Message (Signed)
Important Message  Patient Details IM Letter given to Evette Cristal SW Case Manager to present to the Patient Name: Mason Blackburn MRN: [id number] Date of Birth: October 21, 1945   Medicare Important Message Given:  Yes     Kerin Salen 08/21/2019, 11:47 AM

## 2019-08-21 NOTE — Progress Notes (Signed)
Occupational Therapy Treatment Patient Details Name: Arav Scroggin MRN: [id number] DOB: 06-17-1945 Today's Date: 08/21/2019    History of present illness Pt is 74 yo male with PMH of seizures.  Patient was seen in ED on 06/30/2019 for abdominal symptoms and noted to have urinary retention/bladder distention along with bilateral mild hydronephrosis and colonic stool burden.   Foley catheter was recomend but pt refused and went home.  Over the last week pt with decline in condition and increased confusion.  Pt now admitted with sepsis and metabolic encephalopathy from UTI>   OT comments  Unsure of patient's baseline cognitive functioning however patient does have limited insight to deficits/medical conditions. Patient frequently stating he wants to get out of the hospital however when MD came to speak with patient regarding urination issues patient states "but I just went pee fine" however OT present and note very minimal stream and reminded patient of this. Patient does disclose to this OT that he does not know how to read. When asked the date states 1979 "I don't watch TV or pay attention to those things." Question patient's ability to safely care for himself home alone. Will continue to follow.    Follow Up Recommendations  Supervision/Assistance - 24 hour;SNF    Equipment Recommendations  3 in 1 bedside commode       Precautions / Restrictions Precautions Precautions: Fall Restrictions Weight Bearing Restrictions: No       Mobility Bed Mobility Overal bed mobility: Modified Independent                Transfers Overall transfer level: Needs assistance Equipment used: None Transfers: Sit to/from Stand Sit to Stand: Min guard         General transfer comment: unsteady wtih gait    Balance Overall balance assessment: Needs assistance Sitting-balance support: No upper extremity supported;Feet supported Sitting balance-Leahy Scale: Good     Standing balance support: No  upper extremity supported Standing balance-Leahy Scale: Fair Standing balance comment: Pt able to stand for toileting and LB ADLs but swaying and requiring close guarding for safety                           ADL either performed or assessed with clinical judgement   ADL Overall ADL's : Needs assistance/impaired     Grooming: Wash/dry hands;Min guard;Standing       Lower Body Bathing: Minimal assistance;Sit to/from stand Lower Body Bathing Details (indicate cue type and reason): mild unsteadiness with bending forward to wash LEs in standing, min A for safety Upper Body Dressing : Minimal assistance Upper Body Dressing Details (indicate cue type and reason): for donning clean gown over L UE due to IV site     Toilet Transfer: Min guard;Ambulation;Regular Glass blower/designer Details (indicate cue type and reason): to stand at toilet to urinate, still having difficulty voiding         Functional mobility during ADLs: Min guard;Cueing for safety                 Cognition Arousal/Alertness: Awake/alert Behavior During Therapy: Impulsive Overall Cognitive Status: Impaired/Different from baseline Area of Impairment: Orientation;Safety/judgement;Memory                 Orientation Level: Disoriented to;Time;Situation(for place states "cone")   Memory: Decreased short-term memory   Safety/Judgement: Decreased awareness of safety;Decreased awareness of deficits  Pertinent Vitals/ Pain       Pain Assessment: No/denies pain         Frequency  Min 2X/week        Progress Toward Goals  OT Goals(current goals can now be found in the care plan section)  Progress towards OT goals: Progressing toward goals  Acute Rehab OT Goals Patient Stated Goal: to get out of the hospital OT Goal Formulation: With patient Time For Goal Achievement: 09/01/19 Potential to Achieve Goals: Good ADL Goals Pt Will Perform Lower Body  Bathing: with modified independence;sit to/from stand Pt Will Perform Lower Body Dressing: with modified independence;sit to/from stand Pt Will Transfer to Toilet: with modified independence;ambulating Pt Will Perform Toileting - Clothing Manipulation and hygiene: with modified independence;sitting/lateral leans Additional ADL Goal #1: Pt will complete 3 step trail making task in moderately distracting environment  Plan Discharge plan remains appropriate       AM-PAC OT "6 Clicks" Daily Activity     Outcome Measure   Help from another person eating meals?: None Help from another person taking care of personal grooming?: A Little Help from another person toileting, which includes using toliet, bedpan, or urinal?: A Little Help from another person bathing (including washing, rinsing, drying)?: A Little Help from another person to put on and taking off regular upper body clothing?: A Little Help from another person to put on and taking off regular lower body clothing?: A Little 6 Click Score: 19    End of Session  OT Visit Diagnosis: Unsteadiness on feet (R26.81);Muscle weakness (generalized) (M62.81);Other symptoms and signs involving cognitive function   Activity Tolerance Patient tolerated treatment well   Patient Left in chair;with call bell/phone within reach;with chair alarm set   Nurse Communication Other (comment)(condom cath fell off)        Time: [id number] OT Time Calculation (min): 26 min  Charges: OT General Charges $OT Visit: 1 Visit OT Treatments $Self Care/Home Management : 23-37 mins  Delbert Phenix OT Pager: [place] 08/21/2019, 3:24 PM

## 2019-08-21 NOTE — NC FL2 (Addendum)
[place] LEVEL OF CARE SCREENING TOOL     IDENTIFICATION  Patient Name: Mason Blackburn Birthdate: [birth year] Sex: male Admission Date (Current Location): 08/17/2019  [place] and [place] Number:  Mason Blackburn [id number] [place] and Address:  [name],  [place] [address] [name]      Provider Number: [id number]  Attending Physician Name and Address:  Nita Sells, MD  Relative Name and Phone Number:  Mason Blackburn   [id number] or Pasqual, Bandy   [id number] or TAIGE, DESANTIS  [phone]    Current Level of Care: Hospital Recommended Level of Care: [place] Prior Approval Number:    Date Approved/Denied:   PASRR Number: [id number]  Discharge Plan: SNF    Current Diagnoses: Patient Active Problem List   Diagnosis Date Noted  . Sepsis secondary to UTI ([place]) 08/17/2019  . Acute metabolic encephalopathy [id number]  . Acute urinary retention 08/17/2019  . AKI (acute kidney injury) ([place]) 08/17/2019  . Liver mass 08/17/2019  . Sepsis ([place]) 08/17/2019  . ANXIETY DISORDER, GENERALIZED 06/02/2006  . ABUSE, ALCOHOL, IN REMISSION 06/02/2006  . TOBACCO ABUSE 06/02/2006  . VISUAL HALLUCINATION 06/02/2006  . PARASOMNIA 06/02/2006    Orientation RESPIRATION BLADDER Height & Weight     Self  Normal Incontinent, Indwelling catheter Weight: 149 lb (67.6 kg) Height:  5\' 8"  (172.7 cm)  BEHAVIORAL SYMPTOMS/MOOD NEUROLOGICAL BOWEL NUTRITION STATUS      Continent Diet(Regular)  AMBULATORY STATUS COMMUNICATION OF NEEDS Skin   Limited Assist Verbally Surgical wounds                       Personal Care Assistance Level of Assistance  Bathing, Dressing, Feeding Bathing Assistance: Limited assistance Feeding assistance: Independent Dressing Assistance: Limited assistance     Functional Limitations Info  Sight, Hearing, Speech Sight Info: Adequate Hearing Info: Adequate Speech Info: Adequate    SPECIAL  CARE FACTORS FREQUENCY  PT (By licensed PT), OT (By licensed OT)     PT Frequency: Minimum 5x a week OT Frequency: Minimum 5x a week            Contractures Contractures Info: Not present    Additional Factors Info  Code Status, Allergies Code Status Info: Full Allergies Info: NKA           Current Medications (08/21/2019):  This is the current hospital active medication list Current Facility-Administered Medications  Medication Dose Route Frequency Provider Last Rate Last Admin  . 0.9 %  sodium chloride infusion   Intravenous Continuous Nita Sells, MD 50 mL/hr at 08/21/19 1549 Rate Change at 08/21/19 1549  . acetaminophen (TYLENOL) tablet 650 mg  650 mg Oral Q6H PRN Guilford Shi, MD       Or  . acetaminophen (TYLENOL) suppository 650 mg  650 mg Rectal Q6H PRN Kamineni, Neelima, MD      . cephALEXin (KEFLEX) capsule 500 mg  500 mg Oral Q12H Samtani, Jai-Gurmukh, MD      . feeding supplement (ENSURE ENLIVE) (ENSURE ENLIVE) liquid 237 mL  237 mL Oral BID BM Donnamae Jude, MD   237 mL at 08/21/19 1601  . fentaNYL (SUBLIMAZE) 100 MCG/2ML injection           . midazolam (VERSED) 2 MG/2ML injection           . multivitamin with minerals tablet 1 tablet  1 tablet Oral Daily Donnamae Jude, MD   1 tablet at 08/21/19 0946  . ondansetron (ZOFRAN)  tablet 4 mg  4 mg Oral Q6H PRN Guilford Shi, MD       Or  . ondansetron (ZOFRAN) injection 4 mg  4 mg Intravenous Q6H PRN Guilford Shi, MD      . OXcarbazepine (TRILEPTAL) 300 MG/5ML suspension 300 mg  300 mg Oral Daily Guilford Shi, MD   300 mg at 08/21/19 0947  . OXcarbazepine (TRILEPTAL) 300 MG/5ML suspension 600 mg  600 mg Oral QHS Guilford Shi, MD   600 mg at 08/20/19 2111  . oxyCODONE (Oxy IR/ROXICODONE) immediate release tablet 5 mg  5 mg Oral Q4H PRN Kamineni, Neelima, MD      . polyethylene glycol (MIRALAX / GLYCOLAX) packet 17 g  17 g Oral Daily PRN Kamineni, Neelima, MD      . sodium chloride flush  (NS) 0.9 % injection 5 mL  5 mL Intracatheter Q8H Jacqulynn Cadet, MD   5 mL at 08/21/19 1602  . tamsulosin (FLOMAX) capsule 0.4 mg  0.4 mg Oral Daily Guilford Shi, MD   0.4 mg at 08/21/19 [id number]     Discharge Medications: Please see discharge summary for a list of discharge medications.  Relevant Imaging Results:  Relevant Lab Results:   Additional Information SSN [id number]  Ross Ludwig, LCSW

## 2019-08-21 NOTE — Consult Note (Signed)
Chief Complaint: Patient was seen in consultation today for CT-guided suprapubic catheter placement Chief Complaint  Patient presents with  . Hypotension  . Weakness    Referring Physician(s): Herrick,B  Supervising Physician: Jacqulynn Cadet  Patient Status: [facility] - In-pt  History of Present Illness: Mason Blackburn is a 74 y.o. male with history of seizures and followed by [facility] neurology who was admitted to [name] on 5/28 with altered mental status as well as urinary retention/bladder distention/bilateral mild hydronephrosis/UTI/obstructive renal failure and colonic stool burden.  He has been noncompliant with clean intermittent catheterizations and has refused Foley catheter placement yet unable to void adequately on his own.  He was evaluated by urology and request now received for suprapubic catheter placement.  Patient also has an irregular hypoenhancing area in the central liver concerning for possible cholangiocarcinoma.  Further imaging/workup pending.  Past Medical History:  Diagnosis Date  . Seizures ([place])     History reviewed. No pertinent surgical history.  Allergies: Patient has no known allergies.  Medications: Prior to Admission medications   Medication Sig Start Date End Date Taking? Authorizing Provider  ibuprofen (ADVIL) 200 MG tablet Take 200 mg by mouth every 6 (six) hours as needed for fever.   Yes [provider]  OXcarbazepine (TRILEPTAL) 300 MG/5ML suspension Take 300-600 mg by mouth 2 (two) times daily. Take 300 mg (5 ml) in the morning and Take 600 mg (10 ml) in the evening 06/30/19  Yes [provider]  tamsulosin (FLOMAX) 0.4 MG CAPS capsule Take 0.4 mg by mouth every evening.  07/17/19  Yes [provider]     History reviewed. No pertinent family history.  Social History   Socioeconomic History  . Marital status: Single    Spouse name: Not on file  . Number of children: Not on file  . Years of  education: Not on file  . Highest education level: Not on file  Occupational History  . Not on file  Tobacco Use  . Smoking status: Current Every Day Smoker  . Smokeless tobacco: Never Used  Substance and Sexual Activity  . Alcohol use: Not Currently  . Drug use: Never  . Sexual activity: Not on file  Other Topics Concern  . Not on file  Social History Narrative  . Not on file   Social Determinants of Health   Financial Resource Strain:   . Difficulty of Paying Living Expenses:   Food Insecurity:   . Worried About Charity fundraiser in the Last Year:   . Arboriculturist in the Last Year:   Transportation Needs:   . Film/video editor (Medical):   Marland Kitchen Lack of Transportation (Non-Medical):   Physical Activity:   . Days of Exercise per Week:   . Minutes of Exercise per Session:   Stress:   . Feeling of Stress :   Social Connections:   . Frequency of Communication with Friends and Family:   . Frequency of Social Gatherings with Friends and Family:   . Attends Religious Services:   . Active Member of Clubs or Organizations:   . Attends Archivist Meetings:   Marland Kitchen Marital Status:       Review of Systems currently denies fever, headache, chest pain, dyspnea, cough, back pain, nausea, vomiting or bleeding.  He does occasionally have some mild abdominal/pelvic discomfort as well as noted voiding difficulties.  Vital Signs: BP (!) 99/98 (BP Location: Right Arm)   Pulse 80  Temp 98.9 F (37.2 C) (Oral)   Resp 16   Ht 5\' 8"  (1.727 m)   Wt 149 lb (67.6 kg)   SpO2 99%   BMI 22.66 kg/m   Physical Exam awake but not alert.  Can recall name and birthdate but not location or the year; chest clear to auscultation bilaterally.  Heart with regular rate and rhythm.  Abdomen soft, positive bowel sounds, currently nontender.  No lower extremity edema.  Imaging: DG Chest 2 View  Result Date: 08/17/2019 CLINICAL DATA:  UTI symptoms diaphoretic EXAM: CHEST - 2 VIEW  COMPARISON:  05/07/2006 FINDINGS: The heart size and mediastinal contours are within normal limits. Mild aortic atherosclerosis. Both lungs are clear. The visualized skeletal structures are unremarkable. IMPRESSION: No active cardiopulmonary disease. Electronically Signed   By: Donavan Foil M.D.   On: 08/17/2019 15:31   US Abdomen Limited RUQ  Result Date: 08/19/2019 CLINICAL DATA:  74 year old male with liver mass. EXAM: ULTRASOUND ABDOMEN LIMITED RIGHT UPPER QUADRANT COMPARISON:  CT abdomen pelvis dated 06/30/2019. FINDINGS: Gallbladder: The gallbladder is contracted. Echogenic focus with comet tail artifact along the gallbladder wall most consistent with adenomyomatosis. No gallstone, pericholecystic fluid, or other sonographic findings of acute cholecystitis. Negative sonographic Murphy's sign. Common bile duct: Diameter: 8 mm. There is mild intrahepatic biliary ductal dilatation. Liver: Heterogeneous liver echotexture. There is a 3.6 x 4.0 x 4.8 cm hypoechoic mass in the central liver corresponding to the lesion seen on the prior CT and concerning for malignancy. Further evaluation with MRI without and with contrast is recommended if not previously performed. Portal vein is patent on color Doppler imaging with normal direction of blood flow towards the liver. Other: Small ascites, new since the CT. IMPRESSION: 1. Heterogeneous liver with a hypoechoic mass centrally concerning for malignancy. Further characterization with MRI without and with contrast if not previously performed is recommended. 2. Mild intrahepatic biliary ductal dilatation. 3. No gallstone or sonographic evidence of acute cholecystitis. Contracted gallbladder with small adenomyomatosis. 4. Small ascites, new since the prior CT. Electronically Signed   By: Anner Crete M.D.   On: 08/19/2019 18:16    Labs:  CBC: Recent Labs    08/18/19 0359 08/19/19 0903 08/20/19 0307 08/21/19 0306  WBC 23.6* 19.3* 17.3* 16.5*  HGB 7.7* 9.0*  9.0* 7.4*  HCT 24.6* 28.2* 27.6* 23.6*  PLT 722* 811* 742* 681*    COAGS: Recent Labs    08/17/19 1339  INR 1.3*  APTT 33    BMP: Recent Labs    08/18/19 0359 08/19/19 0903 08/20/19 0307 08/21/19 0306  NA 135 139 139 142  K 4.3 3.7 4.0 3.9  CL 112* 116* 115* 119*  CO2 15* 17* 16* 18*  GLUCOSE 105* 104* 89 100*  BUN 41* 34* 29* 26*  CALCIUM 7.5* 8.0* 8.1* 7.8*  CREATININE 2.33* 1.60* 1.65* 1.32*  GFRNONAA 27* 42* 41* 54*  GFRAA 31* 49* 47* >60    LIVER FUNCTION TESTS: Recent Labs    08/17/19 1339 08/19/19 0903 08/20/19 0307 08/21/19 0306  BILITOT 0.3 0.4 0.3 0.3  AST 11* 16 23 14*  ALT 13 14 18 13   ALKPHOS 61 72 63 51  PROT 5.7* 6.0* 5.9* 4.9*  ALBUMIN 2.1* 2.1* 2.0* 1.7*    TUMOR MARKERS: No results for input(s): AFPTM, CEA, CA199, CHROMGRNA in the last 8760 hours.  Assessment and Plan: 74 y.o. male with history of seizures and followed by [facility] neurology who was admitted to [facility] on  5/28 with altered mental status as well as urinary retention/bladder distention/bilateral mild hydronephrosis/UTI/obstructive renal failure and colonic stool burden.  He has been noncompliant with clean intermittent catheterizations and has refused Foley catheter placement yet unable to void adequately on his own.  He was evaluated by urology and request now received for suprapubic catheter placement.  Patient also has an irregular hypoenhancing area in the central liver concerning for possible cholangiocarcinoma.  Further imaging/workup pending.  Case/imaging studies have been reviewed by Dr. Laurence Ferrari.  Details/risks of suprapubic catheter placement, including but not limited to, internal bleeding, infection, injury to adjacent structures discussed with patient/daughter [facility]) with their understanding and consent. Creat today 1.32. WBC 16.5. Pt on IV Ancef. Urine cx with klebsiella.    Thank you for this interesting consult.  I greatly enjoyed meeting  Trevino Shakur and look forward to participating in their care.  A copy of this report was sent to the requesting provider on this date.  Electronically Signed: D. Rowe Robert, PA-C 08/21/2019, 9:47 AM   I spent a total of  25 minutes   in face to face in clinical consultation, greater than 50% of which was counseling/coordinating care for image guided suprapubic catheter placement

## 2019-08-21 NOTE — TOC Progression Note (Signed)
Transition of Care [facility]) - Progression Note    Patient Details  Name: Wright Molle MRN: [id number] Date of Birth: 1945-06-06  [facility]) CM/SW Contact  Ross Ludwig, [place] Phone Number: 08/21/2019, 4:35 PM  Clinical Narrative:     CSW spoke to patient's daughter [name] via phone, and discussed SNF placement for short term rehab.  CSW explained how insurance will pay for stay, what to expect and the process for finding placement.  CSW discussed with patient's daughter if he has had Covid Vaccine, and she reported he has not.  CSW explained to her, that may affect visitation depending on which facility he will go to.  Patient's daughter stated she will discuss with her brother which facility they are interested in.  CSW was given permission to begin bed search in [facility].  CSW to continue to follow patient's progress throughout discharge planning.   Expected Discharge Plan: (uncertain at this time) Barriers to Discharge: Continued Medical Work up  Expected Discharge Plan and Services Expected Discharge Plan: (uncertain at this time)   Discharge Planning Services: CM Consult   Living arrangements for the past 2 months: Single Family Home                                       Social Determinants of Health (SDOH) Interventions    Readmission Risk Interventions No flowsheet data found.

## 2019-08-21 NOTE — Discharge Summary (Addendum)
Physician Discharge Summary  Mason Blackburn [id number] DOB: 21-Feb-1946 DOA: 08/17/2019  PCP: Patient, No Pcp Per  Admit date: 08/17/2019 Discharge date: 08/21/2019  Time spent: 35 minutes  Recommendations for Outpatient Follow-up:  1. Will need Chem-12 CBC 1 week 2. 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 Will need suprapubic catheter upsizing as per Dr. Marland Kitchen Herrick/Dr. Jacqulynn Cadet of urology/IR respectively who will be copied on this discharge summary 4. Cholangiocarcinoma to be worked up in the outpatient setting-prior to discharge on 6/2 will need biopsy of cholangiocarcinoma suspect area as per my discussion with the patient's daughter Marta Lamas have discussed this and wish a work-up]-I have CCed navigator for GI oncology as below  Discharge Diagnoses:  Principal Problem:   Sepsis secondary to UTI [facility]) Active Problems:   Acute metabolic encephalopathy   Acute urinary retention   AKI (acute kidney injury) ([place])   Liver mass   Sepsis ([place])    Discharge Condition: Guarded  Diet recommendation: Heart healthy  Filed Weights   08/17/19 1334  Weight: 67.6 kg    History of present illness:  74 year old white male history of TBI with resulting seizures followed by [facility] admitted with toxic metabolic encephalopathy [id number] --Previously seen in ED 06/30/2019 with significant abdominal distention hydronephrosis and urinary retention and 4.5X 2.5 cm mass?  Cholangiocarcinoma Outpatient follow-up with urology had voiding trials 1 was advised he was advised to self cath on office visit--it appears he was noncompliant with this per consult note by Dr. Louis Meckel 5/31 and it appears that he failed outpatient management of this  Dr. 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 Valda Favia on this note for follow-up)  Discharge Exam: Vitals:   08/21/19 1408 08/21/19 1440  BP: 123/80 120/78  Pulse: 77 74  Resp: 18 18  Temp: 98.1 F (36.7 C) 98.2 F (36.8 C)  SpO2:      General: Not very coherent but awake Cannot completely oriented Cardiovascular: S1-S2 no murmur Respiratory: Clinically clear no rales no rhonchi Abdomen is soft he has a suprapubic catheter in place he has no rebound no guarding Neurologically intact  Discharge Instructions   Discharge Instructions    Diet - low sodium heart healthy   Complete by: As directed    Increase activity slowly   Complete by: As directed      Allergies as of 08/21/2019   No Known Allergies     Medication List    STOP taking these medications   ibuprofen 200 MG tablet Commonly known as: ADVIL     TAKE these medications   cephALEXin 500 MG capsule Commonly known as: KEFLEX Take 1 capsule (500 mg total) by mouth every 12 (twelve) hours.  OXcarbazepine 300 MG/5ML suspension Commonly known as: TRILEPTAL Take 5-10 mLs (300-600 mg total) by mouth 2 (two) times daily for 5 days. Take 300 mg (5 ml) in the morning and Take 600 mg (10 ml) in the evening   tamsulosin 0.4 MG Caps capsule Commonly known as: FLOMAX Take 0.4 mg by mouth every evening.      No Known Allergies    The results of  significant diagnostics from this hospitalization (including imaging, microbiology, ancillary and laboratory) are listed below for reference.    Significant Diagnostic Studies: DG Chest 2 View  Result Date: 08/17/2019 CLINICAL DATA:  UTI symptoms diaphoretic EXAM: CHEST - 2 VIEW COMPARISON:  05/07/2006 FINDINGS: The heart size and mediastinal contours are within normal limits. Mild aortic atherosclerosis. Both lungs are clear. The visualized skeletal structures are unremarkable. IMPRESSION: No active cardiopulmonary disease. Electronically Signed   By: Donavan Foil M.D.   On: 08/17/2019 15:31   US Abdomen Limited RUQ  Result Date: 08/19/2019 CLINICAL DATA:  74 year old male with liver mass. EXAM: ULTRASOUND ABDOMEN LIMITED RIGHT UPPER QUADRANT COMPARISON:  CT abdomen pelvis dated 06/30/2019. FINDINGS: Gallbladder: The gallbladder is contracted. Echogenic focus with comet tail artifact along the gallbladder wall most consistent with adenomyomatosis. No gallstone, pericholecystic fluid, or other sonographic findings of acute cholecystitis. Negative sonographic Murphy's sign. Common bile duct: Diameter: 8 mm. There is mild intrahepatic biliary ductal dilatation. Liver: Heterogeneous liver echotexture. There is a 3.6 x 4.0 x 4.8 cm hypoechoic mass in the central liver corresponding to the lesion seen on the prior CT and concerning for malignancy. Further evaluation with MRI without and with contrast is recommended if not previously performed. Portal vein is patent on color Doppler imaging with normal direction of blood flow towards the liver. Other: Small ascites, new since the CT. IMPRESSION: 1. Heterogeneous liver with a hypoechoic mass centrally concerning for malignancy. Further characterization with MRI without and with contrast if not previously performed is recommended. 2. Mild intrahepatic biliary ductal dilatation. 3. No gallstone or sonographic evidence of acute cholecystitis. Contracted gallbladder  with small adenomyomatosis. 4. Small ascites, new since the prior CT. Electronically Signed   By: Anner Crete M.D.   On: 08/19/2019 18:16    Microbiology: Recent Results (from the past 240 hour(s))  Culture, blood (Routine x 2)     Status: None (Preliminary result)   Collection Time: 08/17/19  1:23 PM   Specimen: BLOOD RIGHT FOREARM  Result Value Ref Range Status   Specimen Description   Final    BLOOD RIGHT FOREARM Performed at [facility], [place] [address], [place], [place] [address]    Special Requests   Final    BOTTLES DRAWN AEROBIC ONLY Blood Culture results may not be optimal due to an excessive volume of blood received in culture bottles Performed at [place] [address] [place], [place] [address]    Culture   Final    NO GROWTH 4 DAYS Performed at [facility], [place] [address], [place], [place] [address]    Report Status PENDING  Incomplete  Urine culture     Status: Abnormal   Collection Time: 08/17/19  1:26 PM   Specimen: Urine, Clean Catch  Result Value Ref Range Status   Specimen Description   Final    URINE, CLEAN CATCH Performed at [facility], [place] [address], [place], [place] [address]    Special Requests   Final    NONE Performed at  [facility], [place] [address], [place], [place] [address]    Culture >=100,000 COLONIES/mL KLEBSIELLA OXYTOCA (A)  Final   Report Status 08/19/2019 FINAL  Final   Organism ID, Bacteria KLEBSIELLA OXYTOCA (A)  Final      Susceptibility   Klebsiella oxytoca - MIC*    AMPICILLIN >=32 RESISTANT Resistant     CEFAZOLIN 16 SENSITIVE Sensitive     CEFTRIAXONE <=1 SENSITIVE Sensitive     CIPROFLOXACIN <=0.25 SENSITIVE Sensitive     GENTAMICIN <=1 SENSITIVE Sensitive     IMIPENEM <=0.25 SENSITIVE Sensitive     NITROFURANTOIN 64 INTERMEDIATE Intermediate     TRIMETH/SULFA <=20 SENSITIVE Sensitive     AMPICILLIN/SULBACTAM 16 INTERMEDIATE  Intermediate     PIP/TAZO <=4 SENSITIVE Sensitive     * >=100,000 COLONIES/mL KLEBSIELLA OXYTOCA  Culture, blood (Routine x 2)     Status: None (Preliminary result)   Collection Time: 08/17/19  1:28 PM   Specimen: BLOOD  Result Value Ref Range Status   Specimen Description   Final    BLOOD LEFT ANTECUBITAL Performed at [place] [address], [place], [place] [address]    Special Requests   Final    BOTTLES DRAWN AEROBIC ONLY Blood Culture results may not be optimal due to an excessive volume of blood received in culture bottles Performed at [place] [address], [place], [place] [address]    Culture   Final    NO GROWTH 4 DAYS Performed at [facility], [place] [address] [place], [place] [address]    Report Status PENDING  Incomplete  SARS Coronavirus 2 by RT PCR (hospital order, performed in [facility] hospital lab) Nasopharyngeal Nasopharyngeal Swab     Status: None   Collection Time: 08/17/19  1:28 PM   Specimen: Nasopharyngeal Swab  Result Value Ref Range Status   SARS Coronavirus 2 NEGATIVE NEGATIVE Final    Comment: (NOTE) SARS-CoV-2 target nucleic acids are NOT DETECTED. The SARS-CoV-2 RNA is generally detectable in upper and lower respiratory specimens during the acute phase of infection. The lowest concentration of SARS-CoV-2 viral copies this assay can detect is 250 copies / mL. A negative result does not preclude SARS-CoV-2 infection and should not be used as the sole basis for treatment or other patient management decisions.  A negative result may occur with improper specimen collection / handling, submission of specimen other than nasopharyngeal swab, presence of viral mutation(s) within the areas targeted by this assay, and inadequate number of viral copies (<250 copies / mL). A negative result must be combined with clinical observations, patient history, and epidemiological information. Fact Sheet for  Patients:   StrictlyIdeas.no Fact Sheet for Healthcare Providers: BankingDealers.co.za This test is not yet approved or cleared  by the [place] FDA and has been authorized for detection and/or diagnosis of SARS-CoV-2 by FDA under an Emergency Use Authorization (EUA).  This EUA will remain in effect (meaning this test can be used) for the duration of the COVID-19 declaration under Section 564(b)(1) of the Act, 21 U.S.C. section 360bbb-3(b)(1), unless the authorization is terminated or revoked sooner. Performed at [facility], [place] [address], [place], [place] [address]      Labs: Basic Metabolic Panel: Recent Labs  Lab 08/17/19 1339 08/17/19 1735 08/18/19 0359 08/19/19 0903 08/20/19 0307 08/21/19 0306  NA 136  --  135 139 139 142  K 3.9  --  4.3 3.7 4.0 3.9  CL 108  --  112* 116* 115* 119*  CO2 19*  --  15* 17* 16* 18*  GLUCOSE 123*  --  105* 104* 89 100*  BUN 45*  --  41* 34* 29* 26*  CREATININE 2.44*  --  2.33* 1.60* 1.65* 1.32*  CALCIUM 7.5*  --  7.5* 8.0* 8.1* 7.8*  MG  --  1.9  --   --   --   --    Liver Function Tests: Recent Labs  Lab 08/17/19 1339 08/19/19 0903 08/20/19 0307 08/21/19 0306  AST 11* 16 23 14*  ALT 13 14 18 13   ALKPHOS 61 72 63 51  BILITOT 0.3 0.4 0.3 0.3  PROT 5.7* 6.0* 5.9* 4.9*  ALBUMIN 2.1* 2.1* 2.0* 1.7*   No results for input(s): LIPASE, AMYLASE in the last 168 hours. No results for input(s): AMMONIA in the last 168 hours. CBC: Recent Labs  Lab 08/17/19 1339 08/18/19 0359 08/19/19 0903 08/20/19 0307 08/21/19 0306  WBC 20.3* 23.6* 19.3* 17.3* 16.5*  NEUTROABS 16.9*  --   --   --   --   HGB 8.2* 7.7* 9.0* 9.0* 7.4*  HCT 25.6* 24.6* 28.2* 27.6* 23.6*  MCV 91.4 92.5 90.4 89.0 90.8  PLT 761* 722* 811* 742* 681*   Cardiac Enzymes: No results for input(s): CKTOTAL, CKMB, CKMBINDEX, TROPONINI in the last 168 hours. BNP: BNP (last 3 results) No results for  input(s): BNP in the last 8760 hours.  ProBNP (last 3 results) No results for input(s): PROBNP in the last 8760 hours.  CBG: No results for input(s): GLUCAP in the last 168 hours.     Signed:  Nita Sells MD   [facility] 08/21/2019, 5:47 PM

## 2019-08-22 ENCOUNTER — Inpatient Hospital Stay (HOSPITAL_COMMUNITY): Payer: Medicare Other

## 2019-08-22 ENCOUNTER — Other Ambulatory Visit: Payer: Self-pay

## 2019-08-22 DIAGNOSIS — R160 Hepatomegaly, not elsewhere classified: Secondary | ICD-10-CM

## 2019-08-22 DIAGNOSIS — R338 Other retention of urine: Secondary | ICD-10-CM

## 2019-08-22 LAB — CBC
HCT: 25.1 % — ABNORMAL LOW (ref 39.0–52.0)
Hemoglobin: 8.1 g/dL — ABNORMAL LOW (ref 13.0–17.0)
MCH: 29.3 pg (ref 26.0–34.0)
MCHC: 32.3 g/dL (ref 30.0–36.0)
MCV: 90.9 fL (ref 80.0–100.0)
Platelets: 723 10*3/uL — ABNORMAL HIGH (ref 150–400)
RBC: 2.76 MIL/uL — ABNORMAL LOW (ref 4.22–5.81)
RDW: 16.6 % — ABNORMAL HIGH (ref 11.5–15.5)
WBC: 16.3 10*3/uL — ABNORMAL HIGH (ref 4.0–10.5)
nRBC: 0 % (ref 0.0–0.2)

## 2019-08-22 LAB — COMPREHENSIVE METABOLIC PANEL
ALT: 9 U/L (ref 0–44)
AST: 11 U/L — ABNORMAL LOW (ref 15–41)
Albumin: 1.8 g/dL — ABNORMAL LOW (ref 3.5–5.0)
Alkaline Phosphatase: 51 U/L (ref 38–126)
Anion gap: 8 (ref 5–15)
BUN: 21 mg/dL (ref 8–23)
CO2: 16 mmol/L — ABNORMAL LOW (ref 22–32)
Calcium: 7.9 mg/dL — ABNORMAL LOW (ref 8.9–10.3)
Chloride: 117 mmol/L — ABNORMAL HIGH (ref 98–111)
Creatinine, Ser: 1.16 mg/dL (ref 0.61–1.24)
GFR calc Af Amer: >60 mL/min (ref >60)
GFR calc non Af Amer: >60 mL/min (ref >60)
Glucose, Bld: 87 mg/dL (ref 70–99)
Potassium: 3.7 mmol/L (ref 3.5–5.1)
Sodium: 141 mmol/L (ref 135–145)
Total Bilirubin: 0.3 mg/dL (ref 0.3–1.2)
Total Protein: 5.1 g/dL — ABNORMAL LOW (ref 6.5–8.1)

## 2019-08-22 LAB — CULTURE, BLOOD (ROUTINE X 2)
Culture: NO GROWTH
Culture: NO GROWTH

## 2019-08-22 LAB — SARS CORONAVIRUS 2 (TAT 6-24 HRS): SARS Coronavirus 2: NEGATIVE

## 2019-08-22 LAB — PROTIME-INR
INR: 1.2 (ref 0.8–1.2)
Prothrombin Time: 15.1 seconds (ref 11.4–15.2)

## 2019-08-22 MED ORDER — LIDOCAINE HCL (PF) 1 % IJ SOLN
INTRAMUSCULAR | Status: AC | PRN
  Administered 2019-08-22: 10 mL via INTRADERMAL

## 2019-08-22 MED ORDER — MIDAZOLAM HCL 2 MG/2ML IJ SOLN
INTRAMUSCULAR | Status: AC
  Filled 2019-08-22: qty 2

## 2019-08-22 MED ORDER — MIDAZOLAM HCL 2 MG/2ML IJ SOLN
INTRAMUSCULAR | Status: AC | PRN
  Administered 2019-08-22: 1 mg via INTRAVENOUS
  Administered 2019-08-22: 0.5 mg via INTRAVENOUS

## 2019-08-22 MED ORDER — GELATIN ABSORBABLE 12-7 MM EX MISC
CUTANEOUS | Status: AC
  Filled 2019-08-22: qty 1

## 2019-08-22 MED ORDER — FENTANYL CITRATE (PF) 100 MCG/2ML IJ SOLN
INTRAMUSCULAR | Status: AC
  Filled 2019-08-22: qty 2

## 2019-08-22 MED ORDER — FENTANYL CITRATE (PF) 100 MCG/2ML IJ SOLN
INTRAMUSCULAR | Status: AC | PRN
  Administered 2019-08-22: 50 ug via INTRAVENOUS

## 2019-08-22 MED ORDER — LIDOCAINE HCL 1 % IJ SOLN
INTRAMUSCULAR | Status: AC
  Filled 2019-08-22: qty 20

## 2019-08-22 NOTE — Progress Notes (Addendum)
Advised Md in IR of patient's soft BP. Pt's heart rate in 70-80's. Patient is in no apparent distress. Voicing no c/o pain. Abdominal site unchanged. No new orders. Receiving facility aware of soft BP.Eulas Post, RN

## 2019-08-22 NOTE — TOC Progression Note (Signed)
Transition of Care [facility]) - Progression Note    Patient Details  Name: Mason Blackburn MRN: [id number] Date of Birth: 07-23-1945  Transition of Care [facility]) CM/SW Contact  Ross Ludwig, [place] Phone Number: 08/22/2019, 12:40 PM  Clinical Narrative:     CSW spoke to patient's daughter and they chose [facility] for SNF placement.  CSW spoke to [facility] and they can accept patient today if he is medically ready for discharge.  CSW awaiting for confirmation from physician if patient is ready for discharge.  CSW to continue to follow patient's progress throughout discharge planning.  Expected Discharge Plan: (uncertain at this time) Barriers to Discharge: Continued Medical Work up  Expected Discharge Plan and Services Expected Discharge Plan: (uncertain at this time)   Discharge Planning Services: CM Consult   Living arrangements for the past 2 months: Single Family Home Expected Discharge Date: 08/22/19                                     Social Determinants of Health (SDOH) Interventions    Readmission Risk Interventions No flowsheet data found.

## 2019-08-22 NOTE — Progress Notes (Signed)
Hydrologist [facility])  Hospital Liaison: RN note         Notified by [facility] manager of patient/family request for [facility] Palliative services at home after discharge.              [place] Palliative team will follow up with patient after discharge at [facility] SNF.       Please call with any hospice or palliative related questions.         Thank you for this referral.         Farrel Gordon, RN, CCM  [place] (listed on [address] under Hospice[facility])    [phone]

## 2019-08-22 NOTE — Discharge Summary (Signed)
Physician Discharge Summary  Mason Blackburn [id number] DOB: 72-May-1947 DOA: 72/28/2021  PCP: Mason Blackburn, No Pcp Per  Admit date: 08/17/2019 Discharge date: 08/22/2019  Admitted From: Home Disposition: SNF  Recommendations for Outpatient Follow-up:  1. Follow up with SNF provider at earliest convenience with repeat CBC/CMP 2. 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 Outpatient follow-up with urology, Dr. Louis Meckel and IR/Dr. Laurence Ferrari for suprapubic catheter management/upsizing 4. Outpatient follow-up with GI oncology for follow-up of liver biopsy results/?  Cholangiocarcinoma: GI oncology navigator has been notified 5. Recommend outpatient evaluation and follow-up by palliative care 6. Follow up in ED if symptoms worsen or new appear   Home Health: No Equipment/Devices: Suprapubic catheter  Discharge Condition: Guarded CODE STATUS: Full Diet recommendation: Heart healthy  Brief/Interim Summary: History of TBI with resulting seizures followed by [facility], recent finding of hydronephrosis and urinary retention with liver mass with concern for cholangiocarcinoma followed up with urology as an outpatient who had recommended suprapubic catheter placement presented with metabolic encephalopathy on 08/09/2019.  During the hospitalization, he was treated with Rocephin for Klebsiella oxytocin urinary tract infection.  He underwent suprapubic catheter placement by IR on 08/21/2019.  He also underwent ultrasound-guided liver biopsy by IR on 08/22/2019.  Oncology navigator is aware about the Mason Blackburn and will arrange for outpatient follow-up of biopsy.  He will be discharged to SNF once bed is available.  Overall prognosis is guarded to poor.  Will need outpatient palliative care evaluation and follow-up.    Discharge Diagnoses:   Sepsis: Present on admission Acute metabolic encephalopathy UTI: Present on admission -Presented with acute metabolic  encephalopathy probably from UTI and sepsis.  Treated with empiric antibiotics.  Urine culture grew Klebsiella oxytoca.  Will discharge on oral Keflex to complete 7-day course of therapy -Hemodynamically stable.  Sepsis has resolved. -Mental status has improved.  Still slow to respond.  Acute renal failure/obstructive uropathy Acute metabolic acidosis Acute urinary retention Mild hydroureteronephrosis -Urology evaluation appreciated.  Status post suprapubic catheter placement by IR on 08/21/2019.  Outpatient follow-up with urology/IR -Renal function has improved.  Outpatient follow-up  Liver mass/?  Cholangiocarcinoma Generalized deconditioning/debility -Status post IR biopsy today.  GI oncology navigator has been notified.  Pathology to be followed by oncology as an outpatient.  Outpatient follow-up with palliative care as well  Leukocytosis -Improving.  Outpatient follow-up  Seizure disorder -Continue Trileptal.  Outpatient follow-up with neurology.    Discharge Instructions  Discharge Instructions    Diet - low sodium heart healthy   Complete by: As directed    Increase activity slowly   Complete by: As directed    Increase activity slowly   Complete by: As directed      Allergies as of 08/22/2019   No Known Allergies     Medication List    STOP taking these medications   ibuprofen 200 MG tablet Commonly known as: ADVIL     TAKE these medications   cephALEXin 500 MG capsule Commonly known as: KEFLEX Take 1 capsule (500 mg total) by mouth every 12 (twelve) hours.   OXcarbazepine 300 MG/5ML suspension Commonly known as: TRILEPTAL Take 5-10 mLs (300-600 mg total) by mouth 2 (two) times daily for 5 days. Take 300 mg (5 ml) in the morning and Take 600 mg (10 ml) in the evening   tamsulosin 0.4 MG Caps capsule Commonly known as: FLOMAX Take 0.4 mg by mouth every evening.      Follow-up Information  PCP. Schedule an appointment as soon as possible for a visit in  1 week(s).   Why: with cbc/cmp       [place]. Schedule an appointment as soon as possible for a visit in 1 week(s).          No Known Allergies  Consultations:  Urology/IR/GI oncology navigator has been notified   Procedures/Studies: DG Chest 2 View  Result Date: 08/17/2019 CLINICAL DATA:  UTI symptoms diaphoretic EXAM: CHEST - 2 VIEW COMPARISON:  05/07/2006 FINDINGS: The heart size and mediastinal contours are within normal limits. Mild aortic atherosclerosis. Both lungs are clear. The visualized skeletal structures are unremarkable. IMPRESSION: No active cardiopulmonary disease. Electronically Signed   By: Donavan Foil M.D.   On: 08/17/2019 15:31   MR ABDOMEN W WO CONTRAST  Addendum Date: 08/21/2019   ADDENDUM REPORT: 08/21/2019 22:02 ADDENDUM: These results were called by telephone at the time of interpretation on 08/21/2019 at 10:02 pm to on call hospitalist provider [facility]. [facility], who verbally acknowledged these results. Electronically Signed   By: Ilona Sorrel M.D.   On: 08/21/2019 22:02   Result Date: 08/21/2019 CLINICAL DATA:  Indeterminate liver mass on recent CT. EXAM: MRI ABDOMEN WITHOUT AND WITH CONTRAST TECHNIQUE: Multiplanar multisequence MR imaging of the abdomen was performed both before and after the administration of intravenous contrast. CONTRAST:  33mL GADAVIST GADOBUTROL 1 MMOL/ML IV SOLN COMPARISON:  06/30/2019 CT abdomen/pelvis. 08/19/2019 abdominal sonogram. FINDINGS: Motion degraded MRI, limiting assessment. Lower chest: Small dependent bilateral pleural effusions with dependent bibasilar atelectasis. Hepatobiliary: No hepatic steatosis. Poorly marginated heterogeneously enhancing 5.4 x 5.4 cm posterior central left liver lobe mass (series 15/image 32) with restricted diffusion, mildly increased from 5.0 x 4.7 cm on 06/30/2019 CT abdomen study using similar measurement technique. Moderate diffuse intrahepatic biliary ductal dilatation in the left liver  lobe with no intrahepatic biliary ductal dilatation in the right liver lobe. No additional liver masses. Normal nondistended gallbladder with no cholelithiasis. Common bile duct diameter 6 mm, top-normal. No extrahepatic biliary filling defects to suggest choledocholithiasis. Pancreas: No pancreatic mass or duct dilation.  No pancreas divisum. Spleen: Normal size. No mass. Adrenals/Urinary Tract: Normal adrenals. Moderate to severe bilateral hydroureteronephrosis to the level of the pelvic ureters bilaterally. Simple 3.3 cm cystic structure in the interpolar left renal cortex, which may represent a distended caliceal diverticulum (series 24/image 62). Additional subcentimeter simple renal cortical cysts in both kidneys. No suspicious renal masses. Stomach/Bowel: Normal non-distended stomach. Visualized small and large bowel is normal caliber, with no bowel wall thickening. Vascular/Lymphatic: Atherosclerotic nonaneurysmal abdominal aorta. Patent portal, splenic and renal veins. Suggestion of extrinsic narrowing of the left and middle hepatic veins by the liver mass. No pathologically enlarged lymph nodes in the abdomen. Other: Small volume abdominal ascites.  No focal fluid collections. Musculoskeletal: No aggressive appearing focal osseous lesions. IMPRESSION: 1. Heterogeneously enhancing 5.4 x 5.4 cm posterior central left liver lobe mass, mildly increased in size since 06/30/2019 CT abdomen study. Moderate diffuse intrahepatic biliary ductal dilatation in the left liver lobe. 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 Tissue sampling suggested. 2. No abdominal lymphadenopathy. 3. Moderate to severe bilateral hydroureteronephrosis to the level of the pelvic ureters bilaterally of uncertain etiology. 4. Small volume abdominal ascites. 5. Small dependent bilateral pleural effusions. Electronically Signed: By: Ilona Sorrel M.D. On: 08/21/2019 21:51   CT IMAGE GUIDED DRAINAGE BY  PERCUTANEOUS CATHETER  Result Date: 08/21/2019 INDICATION: 74 year old male with bladder dysfunction and significant urinary retention  who presents for placement of a suprapubic catheter. EXAM: CT IMAGE GUIDED DRAINAGE BY PERCUTANEOUS CATHETER COMPARISON:  None. MEDICATIONS: None. ANESTHESIA/SEDATION: Fentanyl 100 mcg IV; Versed 2 mg IV Moderate Sedation Time:  12 minutes The Mason Blackburn was continuously monitored during the procedure by the interventional radiology nurse under my direct supervision. CONTRAST:  None.- administered into the collecting system(s) FLUOROSCOPY TIME:  None. COMPLICATIONS: None immediate. PROCEDURE: Informed written consent was obtained from the Mason Blackburn after a thorough discussion of the procedural risks, benefits and alternatives. All questions were addressed. A timeout was performed prior to the initiation of the procedure. A planning axial CT scan was performed. The distended urinary bladder was identified. A suitable skin entry site was selected and marked. Local anesthesia was attained by infiltration with 1% lidocaine. A small dermatotomy was made. Under intermittent CT guidance, an 18 gauge trocar needle was advanced through the median raphe of the rectus abdominus musculature and into the bladder. A 0.035 wire was then coiled in the bladder. The skin tract was serially dilated to 12 [place] and a 12 [place] all-purpose drainage catheter was advanced over the wire and into the bladder. The catheter was connected to bag drainage and secured to the skin with 0 Prolene suture. Follow-up CT imaging demonstrates a well-positioned suprapubic catheter. IMPRESSION: Successful placement of a 12 French suprapubic catheter into the urinary bladder. Mason Blackburn should follow-up with Interventional Radiology in 4-6 weeks for tube exchange and up size. Signed, Criselda Peaches, MD, [facility] Radiology Electronically Signed   By: Jacqulynn Cadet  M.D.   On: 08/21/2019 18:21   US Abdomen Limited RUQ  Result Date: 08/19/2019 CLINICAL DATA:  74 year old male with liver mass. EXAM: ULTRASOUND ABDOMEN LIMITED RIGHT UPPER QUADRANT COMPARISON:  CT abdomen pelvis dated 06/30/2019. FINDINGS: Gallbladder: The gallbladder is contracted. Echogenic focus with comet tail artifact along the gallbladder wall most consistent with adenomyomatosis. No gallstone, pericholecystic fluid, or other sonographic findings of acute cholecystitis. Negative sonographic Murphy's sign. Common bile duct: Diameter: 8 mm. There is mild intrahepatic biliary ductal dilatation. Liver: Heterogeneous liver echotexture. There is a 3.6 x 4.0 x 4.8 cm hypoechoic mass in the central liver corresponding to the lesion seen on the prior CT and concerning for malignancy. Further evaluation with MRI without and with contrast is recommended if not previously performed. Portal vein is patent on color Doppler imaging with normal direction of blood flow towards the liver. Other: Small ascites, new since the CT. IMPRESSION: 1. Heterogeneous liver with a hypoechoic mass centrally concerning for malignancy. Further characterization with MRI without and with contrast if not previously performed is recommended. 2. Mild intrahepatic biliary ductal dilatation. 3. No gallstone or sonographic evidence of acute cholecystitis. Contracted gallbladder with small adenomyomatosis. 4. Small ascites, new since the prior CT. Electronically Signed   By: Anner Crete M.D.   On: 08/19/2019 18:16       Subjective: Mason Blackburn seen and examined at bedside.  Poor historian.  No overnight fever, vomiting or worsening shortness of breath reported.  Discharge Exam: Vitals:   08/22/19 1215 08/22/19 1247  BP: 128/89 (!) 153/86  Pulse: 83 78  Resp: (!) 22   Temp:  97.9 F (36.6 C)  SpO2: 100% 96%    General: Pt is awake, very poor historian.  Looks chronically ill. Cardiovascular: rate controlled, S1/S2  + Respiratory: bilateral decreased breath sounds at bases Abdominal: Soft, NT, ND, bowel sounds +.  Suprapubic catheter present Extremities: no edema, no cyanosis  The results of significant diagnostics from this hospitalization (including imaging, microbiology, ancillary and laboratory) are listed below for reference.     Microbiology: Recent Results (from the past 240 hour(s))  Culture, blood (Routine x 2)     Status: None   Collection Time: 08/17/19  1:23 PM   Specimen: BLOOD RIGHT FOREARM  Result Value Ref Range Status   Specimen Description   Final    BLOOD RIGHT FOREARM Performed at [facility] [address] [place], [place] [address]    Special Requests   Final    BOTTLES DRAWN AEROBIC ONLY Blood Culture results may not be optimal due to an excessive volume of blood received in culture bottles Performed at [facility] [address] [place], [place] [address]    Culture   Final    NO GROWTH 5 DAYS Performed at [facility], [place] [address] [place], [place] [address]    Report Status 08/22/2019 FINAL  Final  Urine culture     Status: Abnormal   Collection Time: 08/17/19  1:26 PM   Specimen: Urine, Clean Catch  Result Value Ref Range Status   Specimen Description   Final    URINE, CLEAN CATCH Performed at [facility], [place] [address], [place], [place] [address]    Special Requests   Final    NONE Performed at [facility], [place] [address] [place], [place] [address]    Culture >=100,000 COLONIES/mL KLEBSIELLA OXYTOCA (A)  Final   Report Status 08/19/2019 FINAL  Final   Organism ID, Bacteria KLEBSIELLA OXYTOCA (A)  Final      Susceptibility   Klebsiella oxytoca - MIC*    AMPICILLIN >=32 RESISTANT Resistant     CEFAZOLIN 16 SENSITIVE Sensitive     CEFTRIAXONE <=1 SENSITIVE Sensitive     CIPROFLOXACIN <=0.25 SENSITIVE Sensitive     GENTAMICIN <=1 SENSITIVE Sensitive      IMIPENEM <=0.25 SENSITIVE Sensitive     NITROFURANTOIN 64 INTERMEDIATE Intermediate     TRIMETH/SULFA <=20 SENSITIVE Sensitive     AMPICILLIN/SULBACTAM 16 INTERMEDIATE Intermediate     PIP/TAZO <=4 SENSITIVE Sensitive     * >=100,000 COLONIES/mL KLEBSIELLA OXYTOCA  Culture, blood (Routine x 2)     Status: None   Collection Time: 08/17/19  1:28 PM   Specimen: BLOOD  Result Value Ref Range Status   Specimen Description   Final    BLOOD LEFT ANTECUBITAL Performed at [facility] [address], [place], [place] [address]    Special Requests   Final    BOTTLES DRAWN AEROBIC ONLY Blood Culture results may not be optimal due to an excessive volume of blood received in culture bottles Performed at [facility] [address], [place], [place] [address]    Culture   Final    NO GROWTH 5 DAYS Performed at [facility], [place] [address] [place], [place] [address]    Report Status 08/22/2019 FINAL  Final  SARS Coronavirus 2 by RT PCR (hospital order, performed in [facility] hospital lab) Nasopharyngeal Nasopharyngeal Swab     Status: None   Collection Time: 08/17/19  1:28 PM   Specimen: Nasopharyngeal Swab  Result Value Ref Range Status   SARS Coronavirus 2 NEGATIVE NEGATIVE Final    Comment: (NOTE) SARS-CoV-2 target nucleic acids are NOT DETECTED. The SARS-CoV-2 RNA is generally detectable in upper and lower respiratory specimens during the acute phase of infection.  The lowest concentration of SARS-CoV-2 viral copies this assay can detect is 250 copies / mL. A negative result does not preclude SARS-CoV-2 infection and should not be used as the sole basis for treatment or other Mason Blackburn management decisions.  A negative result may occur with improper specimen collection / handling, submission of specimen other than nasopharyngeal swab, presence of viral mutation(s) within the areas targeted by this assay, and inadequate number of viral  copies (<250 copies / mL). A negative result must be combined with clinical observations, Mason Blackburn history, and epidemiological information. Fact Sheet for Patients:   StrictlyIdeas.no Fact Sheet for Healthcare Providers: BankingDealers.co.za This test is not yet approved or cleared  by the [place] FDA and has been authorized for detection and/or diagnosis of SARS-CoV-2 by FDA under an Emergency Use Authorization (EUA).  This EUA will remain in effect (meaning this test can be used) for the duration of the COVID-19 declaration under Section 564(b)(1) of the Act, 21 U.S.C. section 360bbb-3(b)(1), unless the authorization is terminated or revoked sooner. Performed at [facility], [place] [address] [place], [place] [address]   SARS CORONAVIRUS 2 (TAT 6-24 HRS) Nasopharyngeal Nasopharyngeal Swab     Status: None   Collection Time: 08/21/19  4:35 PM   Specimen: Nasopharyngeal Swab  Result Value Ref Range Status   SARS Coronavirus 2 NEGATIVE NEGATIVE Final    Comment: (NOTE) SARS-CoV-2 target nucleic acids are NOT DETECTED. The SARS-CoV-2 RNA is generally detectable in upper and lower respiratory specimens during the acute phase of infection. Negative results do not preclude SARS-CoV-2 infection, do not rule out co-infections with other pathogens, and should not be used as the sole basis for treatment or other Mason Blackburn management decisions. Negative results must be combined with clinical observations, Mason Blackburn history, and epidemiological information. The expected result is Negative. Fact Sheet for Patients: SugarRoll.be Fact Sheet for Healthcare Providers: https://www.woods-mathews.com/ This test is not yet approved or cleared by the [place] FDA and  has been authorized for detection and/or diagnosis of SARS-CoV-2 by FDA under an Emergency Use Authorization (EUA). This EUA  will remain  in effect (meaning this test can be used) for the duration of the COVID-19 declaration under Section 56 4(b)(1) of the Act, 21 U.S.C. section 360bbb-3(b)(1), unless the authorization is terminated or revoked sooner. Performed at [facility], [place] [address] [place], [place] [address]      Labs: BNP (last 3 results) No results for input(s): BNP in the last 8760 hours. Basic Metabolic Panel: Recent Labs  Lab 08/17/19 1339 08/17/19 1735 08/18/19 0359 08/19/19 0903 08/20/19 0307 08/21/19 0306 08/22/19 0337  NA   < >  --  135 139 139 142 141  K   < >  --  4.3 3.7 4.0 3.9 3.7  CL   < >  --  112* 116* 115* 119* 117*  CO2   < >  --  15* 17* 16* 18* 16*  GLUCOSE   < >  --  105* 104* 89 100* 87  BUN   < >  --  41* 34* 29* 26* 21  CREATININE   < >  --  2.33* 1.60* 1.65* 1.32* 1.16  CALCIUM   < >  --  7.5* 8.0* 8.1* 7.8* 7.9*  MG  --  1.9  --   --   --   --   --    < > = values in this interval not displayed.   Liver Function Tests:  Recent Labs  Lab 08/17/19 1339 08/19/19 0903 08/20/19 0307 08/21/19 0306 08/22/19 0337  AST 11* 16 23 14* 11*  ALT 13 14 18 13 9   ALKPHOS 61 72 63 51 51  BILITOT 0.3 0.4 0.3 0.3 0.3  PROT 5.7* 6.0* 5.9* 4.9* 5.1*  ALBUMIN 2.1* 2.1* 2.0* 1.7* 1.8*   No results for input(s): LIPASE, AMYLASE in the last 168 hours. No results for input(s): AMMONIA in the last 168 hours. CBC: Recent Labs  Lab 08/17/19 1339 08/17/19 1339 08/18/19 0359 08/19/19 0903 08/20/19 0307 08/21/19 0306 08/22/19 0337  WBC 20.3*   < > 23.6* 19.3* 17.3* 16.5* 16.3*  NEUTROABS 16.9*  --   --   --   --   --   --   HGB 8.2*   < > 7.7* 9.0* 9.0* 7.4* 8.1*  HCT 25.6*   < > 24.6* 28.2* 27.6* 23.6* 25.1*  MCV 91.4   < > 92.5 90.4 89.0 90.8 90.9  PLT 761*   < > 722* 811* 742* 681* 723*   < > = values in this interval not displayed.   Cardiac Enzymes: No results for input(s): CKTOTAL, CKMB, CKMBINDEX, TROPONINI in the last 168 hours. BNP: Invalid  input(s): POCBNP CBG: No results for input(s): GLUCAP in the last 168 hours. D-Dimer No results for input(s): DDIMER in the last 72 hours. Hgb A1c No results for input(s): HGBA1C in the last 72 hours. Lipid Profile No results for input(s): CHOL, HDL, LDLCALC, TRIG, CHOLHDL, LDLDIRECT in the last 72 hours. Thyroid function studies No results for input(s): TSH, T4TOTAL, T3FREE, THYROIDAB in the last 72 hours.  Invalid input(s): FREET3 Anemia work up No results for input(s): VITAMINB12, FOLATE, FERRITIN, TIBC, IRON, RETICCTPCT in the last 72 hours. Urinalysis    Component Value Date/Time   COLORURINE YELLOW 08/17/2019 1323   APPEARANCEUR TURBID (A) 08/17/2019 1323   LABSPEC 1.020 08/17/2019 1323   PHURINE 6.0 08/17/2019 1323   GLUCOSEU (A) 08/17/2019 1323    TEST NOT REPORTED DUE TO COLOR INTERFERENCE OF URINE PIGMENT   HGBUR (A) 08/17/2019 1323    TEST NOT REPORTED DUE TO COLOR INTERFERENCE OF URINE PIGMENT   BILIRUBINUR (A) 08/17/2019 1323    TEST NOT REPORTED DUE TO COLOR INTERFERENCE OF URINE PIGMENT   KETONESUR (A) 08/17/2019 1323    TEST NOT REPORTED DUE TO COLOR INTERFERENCE OF URINE PIGMENT   PROTEINUR (A) 08/17/2019 1323    TEST NOT REPORTED DUE TO COLOR INTERFERENCE OF URINE PIGMENT   NITRITE (A) 08/17/2019 1323    TEST NOT REPORTED DUE TO COLOR INTERFERENCE OF URINE PIGMENT   LEUKOCYTESUR (A) 08/17/2019 1323    TEST NOT REPORTED DUE TO COLOR INTERFERENCE OF URINE PIGMENT   Sepsis Labs Invalid input(s): PROCALCITONIN,  WBC,  LACTICIDVEN Microbiology Recent Results (from the past 240 hour(s))  Culture, blood (Routine x 2)     Status: None   Collection Time: 08/17/19  1:23 PM   Specimen: BLOOD RIGHT FOREARM  Result Value Ref Range Status   Specimen Description   Final    BLOOD RIGHT FOREARM Performed at [facility], [place] [address], [place], [place] [address]    Special Requests   Final    BOTTLES DRAWN AEROBIC ONLY Blood Culture results may  not be optimal due to an excessive volume of blood received in culture bottles Performed at [facility] [address] [place], [place] [address]    Culture   Final  NO GROWTH 5 DAYS Performed at [facility], [place] [address], [place], [place] [address]    Report Status 08/22/2019 FINAL  Final  Urine culture     Status: Abnormal   Collection Time: 08/17/19  1:26 PM   Specimen: Urine, Clean Catch  Result Value Ref Range Status   Specimen Description   Final    URINE, CLEAN CATCH Performed at [facility], [place] [address] [place], [place] [address]    Special Requests   Final    NONE Performed at [facility], [place] [address] [place], [place] [address]    Culture >=100,000 COLONIES/mL KLEBSIELLA OXYTOCA (A)  Final   Report Status 08/19/2019 FINAL  Final   Organism ID, Bacteria KLEBSIELLA OXYTOCA (A)  Final      Susceptibility   Klebsiella oxytoca - MIC*    AMPICILLIN >=32 RESISTANT Resistant     CEFAZOLIN 16 SENSITIVE Sensitive     CEFTRIAXONE <=1 SENSITIVE Sensitive     CIPROFLOXACIN <=0.25 SENSITIVE Sensitive     GENTAMICIN <=1 SENSITIVE Sensitive     IMIPENEM <=0.25 SENSITIVE Sensitive     NITROFURANTOIN 64 INTERMEDIATE Intermediate     TRIMETH/SULFA <=20 SENSITIVE Sensitive     AMPICILLIN/SULBACTAM 16 INTERMEDIATE Intermediate     PIP/TAZO <=4 SENSITIVE Sensitive     * >=100,000 COLONIES/mL KLEBSIELLA OXYTOCA  Culture, blood (Routine x 2)     Status: None   Collection Time: 08/17/19  1:28 PM   Specimen: BLOOD  Result Value Ref Range Status   Specimen Description   Final    BLOOD LEFT ANTECUBITAL Performed at [facility] [address] [place], [place] [address]    Special Requests   Final    BOTTLES DRAWN AEROBIC ONLY Blood Culture results may not be optimal due to an excessive volume of blood received in culture bottles Performed at [facility] [address], [place], [place] [address]    Culture   Final    NO GROWTH 5 DAYS Performed at [facility], [place] [address], [place], [place] [address]    Report Status 08/22/2019 FINAL  Final  SARS Coronavirus 2 by RT PCR (hospital order, performed in [facility] hospital lab) Nasopharyngeal Nasopharyngeal Swab     Status: None   Collection Time: 08/17/19  1:28 PM   Specimen: Nasopharyngeal Swab  Result Value Ref Range Status   SARS Coronavirus 2 NEGATIVE NEGATIVE Final    Comment: (NOTE) SARS-CoV-2 target nucleic acids are NOT DETECTED. The SARS-CoV-2 RNA is generally detectable in upper and lower respiratory specimens during the acute phase of infection. The lowest concentration of SARS-CoV-2 viral copies this assay can detect is 250 copies / mL. A negative result does not preclude SARS-CoV-2 infection and should not be used as the sole basis for treatment or other Mason Blackburn management decisions.  A negative result may occur with improper specimen collection / handling, submission of specimen other than nasopharyngeal swab, presence of viral mutation(s) within the areas targeted by this assay, and inadequate number of viral copies (<250 copies / mL). A negative result must be combined with clinical observations, Mason Blackburn history, and epidemiological information. Fact Sheet for Patients:   StrictlyIdeas.no Fact Sheet for Healthcare Providers: BankingDealers.co.za This test is not yet approved or cleared  by the [place] FDA and has been authorized for detection and/or diagnosis of SARS-CoV-2 by FDA under an Emergency Use Authorization (EUA).  This EUA will remain in  effect (meaning this test can be used) for the duration of the COVID-19 declaration under Section 564(b)(1) of the Act, 21 U.S.C. section 360bbb-3(b)(1), unless the authorization is terminated or revoked sooner. Performed at [facility], [place] [address], [place], [place] [address]   SARS CORONAVIRUS 2 (TAT 6-24 HRS) Nasopharyngeal Nasopharyngeal Swab     Status: None   Collection Time: 08/21/19  4:35 PM   Specimen: Nasopharyngeal Swab  Result Value Ref Range Status   SARS Coronavirus 2 NEGATIVE NEGATIVE Final    Comment: (NOTE) SARS-CoV-2 target nucleic acids are NOT DETECTED. The SARS-CoV-2 RNA is generally detectable in upper and lower respiratory specimens during the acute phase of infection. Negative results do not preclude SARS-CoV-2 infection, do not rule out co-infections with other pathogens, and should not be used as the sole basis for treatment or other Mason Blackburn management decisions. Negative results must be combined with clinical observations, Mason Blackburn history, and epidemiological information. The expected result is Negative. Fact Sheet for Patients: SugarRoll.be Fact Sheet for Healthcare Providers: https://www.woods-mathews.com/ This test is not yet approved or cleared by the [place] FDA and  has been authorized for detection and/or diagnosis of SARS-CoV-2 by FDA under an Emergency Use Authorization (EUA). This EUA will remain  in effect (meaning this test can be used) for the duration of the COVID-19 declaration under Section 56 4(b)(1) of the Act, 21 U.S.C. section 360bbb-3(b)(1), unless the authorization is terminated or revoked sooner. Performed at [facility], [place] [address] [place], [place] [address]      Time coordinating discharge: 35 minutes  SIGNED:   Aline August, MD  [facility] 08/22/2019, 1:09 PM

## 2019-08-22 NOTE — Procedures (Signed)
Interventional Radiology Procedure Note  Procedure: [place] BX CENTRAL LIVER MASS  Complications: None  Estimated Blood Loss: MIN  Findings: 18 G CORES X 2

## 2019-08-22 NOTE — TOC Transition Note (Addendum)
Transition of Care [facility]) - CM/SW Discharge Note   Patient Details  Name: Mason Blackburn MRN: [id number] Date of Birth: 1945/08/12  Transition of Care [facility]) CM/SW Contact:  Ross Ludwig, LCSW Phone Number: 08/22/2019, 2:31 PM   Clinical Narrative:     Patient to be d/c'ed today to [facility] room 38.  Patient and family agreeable to plans will transport via ems RN to call report [phone].  Patient's daughter [name] is at bedside, and aware of patient discharging today.  Patient's family is interested in having palliative follow patient at SNF.  CSW made referral to Audrea Muscat at [facility] which can follow patient will palliative services at [facility].  Final next level of care: Skilled Nursing Facility Barriers to Discharge: Barriers Resolved   Patient Goals and CMS Choice Patient states their goals for this hospitalization and ongoing recovery are:: To go to SNF for short term rehab, then return back home with home health. CMS Medicare.gov Compare Post Acute Care list provided to:: Patient Choice offered to / list presented to : Patient, Adult Children  Discharge Placement PASRR number recieved: 08/21/19            Patient chooses bed at: [facility] Patient to be transferred to facility by: [facility] Name of family member notified: [name] patient's daughter Patient and family notified of of transfer: 08/22/19  Discharge Plan and Services   Discharge Planning Services: CM Consult            DME Arranged: N/A         HH Arranged: NA          Social Determinants of Health ([place]) Interventions     Readmission Risk Interventions No flowsheet data found.

## 2019-08-22 NOTE — Progress Notes (Signed)
Report called to Eustaquio Maize, Therapist, sports at [facility]. Awaiting [facility] for transportation. Eulas Post, RN

## 2019-08-22 NOTE — Progress Notes (Signed)
Referring Physician(s): Samtani,J  Supervising Physician: Daryll Brod  Patient Status:  [facility] - In-pt  Chief Complaint: Liver lesion   Subjective: Patient familiar to IR service from suprapubic catheter placement yesterday.  He has also been under evaluation for possible liver lesion biopsy.  MRI abdomen performed yesterday revealed:  1. Heterogeneously enhancing 5.4 x 5.4 cm posterior central left liver lobe mass, mildly increased in size since 06/30/2019 CT abdomen study. Moderate diffuse intrahepatic biliary ductal dilatation in the left liver lobe. 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 Tissue sampling suggested. 2. No abdominal lymphadenopathy. 3. Moderate to severe bilateral hydroureteronephrosis to the level of the pelvic ureters bilaterally of uncertain etiology. 4. Small volume abdominal ascites. 5. Small dependent bilateral pleural effusions.  He has been scheduled today for liver lesion biopsy for further evaluation.  He currently denies fever, headache, chest pain, worsening dyspnea, cough, significant back pain, nausea, vomiting or bleeding.  He does have some suprapubic soreness to palpation.  His suprapubic catheter is draining appropriately.  Insertion site okay.  Past Medical History:  Diagnosis Date  . Seizures ([place])    History reviewed. No pertinent surgical history.    Allergies: Patient has no known allergies.  Medications: Prior to Admission medications   Medication Sig Start Date End Date Taking? Authorizing Provider  ibuprofen (ADVIL) 200 MG tablet Take 200 mg by mouth every 6 (six) hours as needed for fever.   Yes [provider]  tamsulosin (FLOMAX) 0.4 MG CAPS capsule Take 0.4 mg by mouth every evening.  07/17/19  Yes [provider]  cephALEXin (KEFLEX) 500 MG capsule Take 1 capsule (500 mg total) by mouth every 12 (twelve) hours. 08/21/19   Nita Sells, MD  OXcarbazepine (TRILEPTAL)  300 MG/5ML suspension Take 5-10 mLs (300-600 mg total) by mouth 2 (two) times daily for 5 days. Take 300 mg (5 ml) in the morning and Take 600 mg (10 ml) in the evening 08/21/19 08/26/19  Nita Sells, MD     Vital Signs: BP (!) 143/87   Pulse 80   Temp (!) 97.5 F (36.4 C) (Oral)   Resp 18   Ht 5\' 8"  (1.727 m)   Wt 149 lb (67.6 kg)   SpO2 94%   BMI 22.66 kg/m   Physical Exam patient awake, answers simple questions okay.  Chest with slightly diminished breath sounds bases.  Heart with regular rate /rhythm.  Abdomen soft, positive bowel sounds, intact suprapubic catheter draining slightly blood-tinged urine, insertion site slightly tender to palpation.  No lower extremity edema.  Imaging: MR ABDOMEN W WO CONTRAST  Addendum Date: 08/21/2019   ADDENDUM REPORT: 08/21/2019 22:02 ADDENDUM: These results were called by telephone at the time of interpretation on 08/21/2019 at 10:02 pm to on call hospitalist provider DR. [facility], who verbally acknowledged these results. Electronically Signed   By: Ilona Sorrel M.D.   On: 08/21/2019 22:02   Result Date: 08/21/2019 CLINICAL DATA:  Indeterminate liver mass on recent CT. EXAM: MRI ABDOMEN WITHOUT AND WITH CONTRAST TECHNIQUE: Multiplanar multisequence MR imaging of the abdomen was performed both before and after the administration of intravenous contrast. CONTRAST:  49mL GADAVIST GADOBUTROL 1 MMOL/ML IV SOLN COMPARISON:  06/30/2019 CT abdomen/pelvis. 08/19/2019 abdominal sonogram. FINDINGS: Motion degraded MRI, limiting assessment. Lower chest: Small dependent bilateral pleural effusions with dependent bibasilar atelectasis. Hepatobiliary: No hepatic steatosis. Poorly marginated heterogeneously enhancing 5.4 x 5.4 cm posterior central left liver lobe mass (series 15/image 32) with restricted diffusion, mildly increased from  5.0 x 4.7 cm on 06/30/2019 CT abdomen study using similar measurement technique. Moderate diffuse intrahepatic biliary ductal dilatation  in the left liver lobe with no intrahepatic biliary ductal dilatation in the right liver lobe. No additional liver masses. Normal nondistended gallbladder with no cholelithiasis. Common bile duct diameter 6 mm, top-normal. No extrahepatic biliary filling defects to suggest choledocholithiasis. Pancreas: No pancreatic mass or duct dilation.  No pancreas divisum. Spleen: Normal size. No mass. Adrenals/Urinary Tract: Normal adrenals. Moderate to severe bilateral hydroureteronephrosis to the level of the pelvic ureters bilaterally. Simple 3.3 cm cystic structure in the interpolar left renal cortex, which may represent a distended caliceal diverticulum (series 24/image 62). Additional subcentimeter simple renal cortical cysts in both kidneys. No suspicious renal masses. Stomach/Bowel: Normal non-distended stomach. Visualized small and large bowel is normal caliber, with no bowel wall thickening. Vascular/Lymphatic: Atherosclerotic nonaneurysmal abdominal aorta. Patent portal, splenic and renal veins. Suggestion of extrinsic narrowing of the left and middle hepatic veins by the liver mass. No pathologically enlarged lymph nodes in the abdomen. Other: Small volume abdominal ascites.  No focal fluid collections. Musculoskeletal: No aggressive appearing focal osseous lesions. IMPRESSION: 1. Heterogeneously enhancing 5.4 x 5.4 cm posterior central left liver lobe mass, mildly increased in size since 06/30/2019 CT abdomen study. Moderate diffuse intrahepatic biliary ductal dilatation in the left liver lobe. 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 Tissue sampling suggested. 2. No abdominal lymphadenopathy. 3. Moderate to severe bilateral hydroureteronephrosis to the level of the pelvic ureters bilaterally of uncertain etiology. 4. Small volume abdominal ascites. 5. Small dependent bilateral pleural effusions. Electronically Signed: By: Ilona Sorrel M.D. On: 08/21/2019 21:51   CT IMAGE GUIDED  DRAINAGE BY PERCUTANEOUS CATHETER  Result Date: 08/21/2019 INDICATION: 74 year old male with bladder dysfunction and significant urinary retention who presents for placement of a suprapubic catheter. EXAM: CT IMAGE GUIDED DRAINAGE BY PERCUTANEOUS CATHETER COMPARISON:  None. MEDICATIONS: None. ANESTHESIA/SEDATION: Fentanyl 100 mcg IV; Versed 2 mg IV Moderate Sedation Time:  12 minutes The patient was continuously monitored during the procedure by the interventional radiology nurse under my direct supervision. CONTRAST:  None.- administered into the collecting system(s) FLUOROSCOPY TIME:  None. COMPLICATIONS: None immediate. PROCEDURE: Informed written consent was obtained from the patient after a thorough discussion of the procedural risks, benefits and alternatives. All questions were addressed. A timeout was performed prior to the initiation of the procedure. A planning axial CT scan was performed. The distended urinary bladder was identified. A suitable skin entry site was selected and marked. Local anesthesia was attained by infiltration with 1% lidocaine. A small dermatotomy was made. Under intermittent CT guidance, an 18 gauge trocar needle was advanced through the median raphe of the rectus abdominus musculature and into the bladder. A 0.035 wire was then coiled in the bladder. The skin tract was serially dilated to 12 [place] and a 12 [place] all-purpose drainage catheter was advanced over the wire and into the bladder. The catheter was connected to bag drainage and secured to the skin with 0 Prolene suture. Follow-up CT imaging demonstrates a well-positioned suprapubic catheter. IMPRESSION: Successful placement of a 12 French suprapubic catheter into the urinary bladder. Patient should follow-up with Interventional Radiology in 4-6 weeks for tube exchange and up size. Signed, Criselda Peaches, MD, [facility] Electronically Signed   By: Jacqulynn Cadet M.D.   On: 08/21/2019 18:21   US Abdomen Limited RUQ  Result Date: 08/19/2019 CLINICAL DATA:  74 year old male with  liver mass. EXAM: ULTRASOUND ABDOMEN LIMITED RIGHT UPPER QUADRANT COMPARISON:  CT abdomen pelvis dated 06/30/2019. FINDINGS: Gallbladder: The gallbladder is contracted. Echogenic focus with comet tail artifact along the gallbladder wall most consistent with adenomyomatosis. No gallstone, pericholecystic fluid, or other sonographic findings of acute cholecystitis. Negative sonographic Murphy's sign. Common bile duct: Diameter: 8 mm. There is mild intrahepatic biliary ductal dilatation. Liver: Heterogeneous liver echotexture. There is a 3.6 x 4.0 x 4.8 cm hypoechoic mass in the central liver corresponding to the lesion seen on the prior CT and concerning for malignancy. Further evaluation with MRI without and with contrast is recommended if not previously performed. Portal vein is patent on color Doppler imaging with normal direction of blood flow towards the liver. Other: Small ascites, new since the CT. IMPRESSION: 1. Heterogeneous liver with a hypoechoic mass centrally concerning for malignancy. Further characterization with MRI without and with contrast if not previously performed is recommended. 2. Mild intrahepatic biliary ductal dilatation. 3. No gallstone or sonographic evidence of acute cholecystitis. Contracted gallbladder with small adenomyomatosis. 4. Small ascites, new since the prior CT. Electronically Signed   By: Anner Crete M.D.   On: 08/19/2019 18:16    Labs:  CBC: Recent Labs    08/19/19 0903 08/20/19 0307 08/21/19 0306 08/22/19 0337  WBC 19.3* 17.3* 16.5* 16.3*  HGB 9.0* 9.0* 7.4* 8.1*  HCT 28.2* 27.6* 23.6* 25.1*  PLT 811* 742* 681* 723*    COAGS: Recent Labs    08/17/19 1339 08/22/19 0337  INR 1.3* 1.2  APTT 33  --     BMP: Recent Labs    08/19/19 0903 08/20/19 0307 08/21/19 0306 08/22/19 0337  NA 139 139 142 141  K 3.7 4.0 3.9  3.7  CL 116* 115* 119* 117*  CO2 17* 16* 18* 16*  GLUCOSE 104* 89 100* 87  BUN 34* 29* 26* 21  CALCIUM 8.0* 8.1* 7.8* 7.9*  CREATININE 1.60* 1.65* 1.32* 1.16  GFRNONAA 42* 41* 54* >60  GFRAA 49* 47* >60 >60    LIVER FUNCTION TESTS: Recent Labs    08/19/19 0903 08/20/19 0307 08/21/19 0306 08/22/19 0337  BILITOT 0.4 0.3 0.3 0.3  AST 16 23 14* 11*  ALT 14 18 13 9   ALKPHOS 72 63 51 51  PROT 6.0* 5.9* 4.9* 5.1*  ALBUMIN 2.1* 2.0* 1.7* 1.8*    Assessment and Plan: 74 year old male with history of UTI/metabolic encephalopathy, acute renal failure, seizure disorder, urinary retention status post suprapubic catheter yesterday, anemia, And imaging findings of posterior central left lobe liver mass concerning for cholangiocarcinoma, moderate to severe bilateral hydroureteronephrosis, small ascites and small bilateral pleural effusions.  Request received for image guided liver lesion biopsy.  Imaging studies were reviewed by Dr.Shick. Risks and benefits of procedure was discussed with the patient /daughter [facility] including, but not limited to bleeding, infection, damage to adjacent structures or low yield requiring additional tests.  All of the questions were answered and there is agreement to proceed.  Consent signed and in chart.  Procedure scheduled for today  Electronically Signed: D. Rowe Robert, PA-C 08/22/2019, 11:21 AM   I spent a total of 25 minutes at the the patient's bedside AND on the patient's hospital floor or unit, greater than 50% of which was counseling/coordinating care for image guided liver lesion biopsy    Patient ID: Mason Blackburn, male   DOB: 04/07/1945, 74 y.o.   MRN: [id number]

## 2019-08-24 LAB — SURGICAL PATHOLOGY

## 2019-08-29 NOTE — Progress Notes (Signed)
Spoke with Mason Blackburn at [facility] where patient current resides regarding appointment we have scheduled to see medical oncologist.  It is Tuesday 09/04/2019 to arrive by 10:00 am.  The facility does provide transportation for this appointment.    Spoke with patient's daughter Mason Blackburn regarding upcoming appointment.  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  She states he is in very poor health because he has always had a fear of doctors and going to the hospital.  I explained my role as GI nurse navigator.  She mains to meet him here for his appointment.

## 2019-09-04 ENCOUNTER — Inpatient Hospital Stay: Payer: No Typology Code available for payment source | Attending: Nurse Practitioner | Admitting: Nurse Practitioner

## 2019-09-04 ENCOUNTER — Encounter: Payer: Self-pay | Admitting: Nurse Practitioner

## 2019-09-04 ENCOUNTER — Inpatient Hospital Stay: Payer: No Typology Code available for payment source

## 2019-09-04 ENCOUNTER — Other Ambulatory Visit: Payer: Self-pay

## 2019-09-04 VITALS — BP 117/67 | HR 84 | Temp 97.7°F | Resp 18 | Wt 140.3 lb

## 2019-09-04 DIAGNOSIS — Z87891 Personal history of nicotine dependence: Secondary | ICD-10-CM | POA: Diagnosis not present

## 2019-09-04 DIAGNOSIS — C221 Intrahepatic bile duct carcinoma: Secondary | ICD-10-CM | POA: Diagnosis present

## 2019-09-04 DIAGNOSIS — F1011 Alcohol abuse, in remission: Secondary | ICD-10-CM

## 2019-09-04 DIAGNOSIS — R569 Unspecified convulsions: Secondary | ICD-10-CM

## 2019-09-04 DIAGNOSIS — Z9114 Patient's other noncompliance with medication regimen: Secondary | ICD-10-CM | POA: Insufficient documentation

## 2019-09-04 DIAGNOSIS — N390 Urinary tract infection, site not specified: Secondary | ICD-10-CM | POA: Insufficient documentation

## 2019-09-04 DIAGNOSIS — Z87820 Personal history of traumatic brain injury: Secondary | ICD-10-CM

## 2019-09-04 DIAGNOSIS — D473 Essential (hemorrhagic) thrombocythemia: Secondary | ICD-10-CM | POA: Insufficient documentation

## 2019-09-04 DIAGNOSIS — Z960 Presence of urogenital implants: Secondary | ICD-10-CM

## 2019-09-04 DIAGNOSIS — D649 Anemia, unspecified: Secondary | ICD-10-CM | POA: Diagnosis not present

## 2019-09-04 LAB — CMP (CANCER CENTER ONLY)
ALT: 11 U/L (ref 0–44)
AST: 9 U/L — ABNORMAL LOW (ref 15–41)
Albumin: 2.9 g/dL — ABNORMAL LOW (ref 3.5–5.0)
Alkaline Phosphatase: 95 U/L (ref 38–126)
Anion gap: 10 (ref 5–15)
BUN: 20 mg/dL (ref 8–23)
CO2: 27 mmol/L (ref 22–32)
Calcium: 9.6 mg/dL (ref 8.9–10.3)
Chloride: 104 mmol/L (ref 98–111)
Creatinine: 1.04 mg/dL (ref 0.61–1.24)
GFR, Est AFR Am: >60 mL/min (ref >60)
GFR, Estimated: >60 mL/min (ref >60)
Glucose, Bld: 95 mg/dL (ref 70–99)
Potassium: 4.2 mmol/L (ref 3.5–5.1)
Sodium: 141 mmol/L (ref 135–145)
Total Bilirubin: 0.3 mg/dL (ref 0.3–1.2)
Total Protein: 7.3 g/dL (ref 6.5–8.1)

## 2019-09-04 LAB — CBC WITH DIFFERENTIAL (CANCER CENTER ONLY)
Abs Immature Granulocytes: 0.03 10*3/uL (ref 0.00–0.07)
Basophils Absolute: 0.2 10*3/uL — ABNORMAL HIGH (ref 0.0–0.1)
Basophils Relative: 1 %
Eosinophils Absolute: 0.7 10*3/uL — ABNORMAL HIGH (ref 0.0–0.5)
Eosinophils Relative: 6 %
HCT: 28.5 % — ABNORMAL LOW (ref 39.0–52.0)
Hemoglobin: 9.1 g/dL — ABNORMAL LOW (ref 13.0–17.0)
Immature Granulocytes: 0 %
Lymphocytes Relative: 15 %
Lymphs Abs: 1.8 10*3/uL (ref 0.7–4.0)
MCH: 29.9 pg (ref 26.0–34.0)
MCHC: 31.9 g/dL (ref 30.0–36.0)
MCV: 93.8 fL (ref 80.0–100.0)
Monocytes Absolute: 1.2 10*3/uL — ABNORMAL HIGH (ref 0.1–1.0)
Monocytes Relative: 10 %
Neutro Abs: 8.7 10*3/uL — ABNORMAL HIGH (ref 1.7–7.7)
Neutrophils Relative %: 68 %
Platelet Count: 494 10*3/uL — ABNORMAL HIGH (ref 150–400)
RBC: 3.04 MIL/uL — ABNORMAL LOW (ref 4.22–5.81)
RDW: 18.9 % — ABNORMAL HIGH (ref 11.5–15.5)
WBC Count: 12.6 10*3/uL — ABNORMAL HIGH (ref 4.0–10.5)
nRBC: 0 % (ref 0.0–0.2)

## 2019-09-04 LAB — IRON AND TIBC
Iron: 16 ug/dL — ABNORMAL LOW (ref 45–182)
Saturation Ratios: 7 % — ABNORMAL LOW (ref 17.9–39.5)
TIBC: 245 ug/dL — ABNORMAL LOW (ref 250–450)
UIBC: 229 ug/dL

## 2019-09-04 LAB — FERRITIN: Ferritin: 207 ng/mL (ref 24–336)

## 2019-09-04 NOTE — Progress Notes (Signed)
Met with patient and his daughter [name] at their initial appointment with medical oncology Cira Rue NP and Dr. Burr Medico today.  I explained my role as nurse navigator and his daughter was provided my card with my direct contact information.  They verbalized an understanding of the plan to proceed with a CT scan of the chest and will get this scheduled within the next couple of weeks.

## 2019-09-04 NOTE — Progress Notes (Signed)
Error

## 2019-09-04 NOTE — Progress Notes (Addendum)
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 in Rad onc  -Virtual visit 7/1 or 7/2, after CT chest and tumor board discussion to discuss goals of care and treatment plan   Orders Placed This Encounter  Procedures  . CT Chest Wo Contrast    Standing Status:   Future    Standing Expiration Date:   09/03/2020    Order Specific Question:   Preferred imaging location?    Answer:   [facility]    Order Specific Question:   Radiology Contrast Protocol - do NOT remove file path    Answer:   \\[email or web address].[email or web address]  . CBC with Differential ([place] Only)    Standing Status:   Standing    Number of Occurrences:   50    Standing Expiration Date:   09/03/2020  . CMP ([place] only)    Standing Status:   Standing    Number of Occurrences:   50    Standing Expiration Date:   09/03/2020  . Iron and TIBC    Standing Status:   Standing    Number of Occurrences:   50    Standing Expiration Date:   09/03/2020  . Ferritin    Standing Status:   Standing    Number of Occurrences:   50    Standing Expiration Date:   09/03/2020  . CA 19.9    Standing Status:    Standing    Number of Occurrences:   50    Standing Expiration Date:   09/03/2020  . Ambulatory referral to Radiation Oncology    Referral Priority:   Routine    Referral Type:   Consultation    Referral Reason:   Specialty Services Required    Referred to Provider:   Kyung Rudd, MD    Requested Specialty:   Radiation Oncology    Number of Visits Requested:   1    All questions were answered. The patient knows to call the clinic with any problems, questions or concerns.  [name], NP 09/04/2019   Addendum  I have seen the patient, examined him. I agree with the assessment and and plan and have edited the notes.   Mason Blackburn is a 74 yo male with past medical history of seizure, presented with obstructive uropathy and urosepsis, which required hospital admission and suprapubic catheter placement. His CT and MRI scan during the hospital admission showed a growing mass in the left lobe of liver, now 5.4 cm. 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 Will obtain CT chest to complete staging. Patient unfortunately has very poor performance status and multiple medical issues now, lives in SNF, and not a good candidate for surgery at this point. We discussed that surgical resection is the only way to cure his cancer, and he does have high risk of recurrence even he underwent surgical resection. If surgery is not an option, I discussed other local and systemic treatment options, including radiation and radioembolization, I also discussed role of systemic therapy, especially chemotherapy. I do not think he is a good candidate for chemotherapy due to his poor overall general health and risk of infection. After lengthy discussion, I recommend him to consider radiation for disease control. Both patient and his daughter are agreeable with the plan. We will discuss his case in GI tumor conference, and refer him to radiation oncology. I will also order  [employer] One genomic test on his liver biopsy to see if he is a candidate for immunotherapy or targeted therapy in the future. All questions answered.   [name]  09/04/2019

## 2019-09-05 LAB — CANCER ANTIGEN 19-9: CA 19-9: 5 U/mL (ref 0–35)

## 2019-09-11 ENCOUNTER — Other Ambulatory Visit: Payer: Self-pay

## 2019-09-11 ENCOUNTER — Ambulatory Visit (HOSPITAL_COMMUNITY)
Admission: RE | Admit: 2019-09-11 | Discharge: 2019-09-11 | Disposition: A | Discharge status: Home / Self Care | Payer: Medicare Other | Source: Ambulatory Visit | Attending: Nurse Practitioner | Admitting: Nurse Practitioner

## 2019-09-11 ENCOUNTER — Encounter (HOSPITAL_COMMUNITY): Payer: Self-pay

## 2019-09-11 DIAGNOSIS — C221 Intrahepatic bile duct carcinoma: Secondary | ICD-10-CM

## 2019-09-14 ENCOUNTER — Telehealth: Payer: Self-pay | Admitting: *Deleted

## 2019-09-14 NOTE — Telephone Encounter (Signed)
Received call from Rema Jasmine with [facility] stating they have received orders for D/C home with [facility] orders for nursing, PT, & OT.  She states pt doesn't have PCP & asking if our MD will sign orders for [facility].  Message routed to Dr Burr Medico.

## 2019-09-17 NOTE — Progress Notes (Signed)
GI Location of Tumor / Histology: Cholangiocarcinoma, multiple pulmonary nodules  Mason Blackburn presented with one month history of constipation and urinary incontinence.  CT Chest 09/11/2019: Multiple bilateral solid and non solid pulmonary nodules.  Dominant solid nodule is 6 mm in the left lower lobe with dominant non solid lesion measuring up to 15 mm, also in the left lower lobe.  Close attention on follow-up recommended.  Borderline to mild mediastinal lymphadenopathy. Nodular soft tissue in the hilar regions raises the question of adenopathy although is poorly assessed on this noncontrast study.  MRI Abdomen 08/21/2019: Heterogeneously enhancing 5.4 x 5.4 cm posterior central left liver lobe mass, mildly increased in size since 06/30/2019 CT abdomen study. Moderate diffuse intrahepatic biliary ductal dilatation in the left liver lobe. 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 Tissue sampling suggested  ABD [place] 08/19/2019:There is a 3.6 x 4.0 x 4.8 cm hypoechoic mass in the central liver corresponding to the lesion seen on the prior CT and concerning for malignancy.  Mild intrahepatic biliary ductal dilatation.  CT AP 06/30/2019: A 4.5 x 2.5 cm irregular hypoenhancing area in the central liver concerning for a malignancy, possibly a cholangiocarcinoma. Clinical correlation and further characterization with MRI/MRCP without and with contrast is recommended.  Biopsies of Liver 08/22/2019   Past/Anticipated interventions by surgeon, if any:   Past/Anticipated interventions by medical oncology, if any:  NP [place]/ Dr. Burr Medico 09/04/2019 -He presented to ED initially with urinary retention, liver mass was found incidentally. Patient did not get work up done as outpatient. Over the past month his physical condition declined, he lost weight, and developed UTI, metabolic encephalopathy, and sepsis. -work up on the enlarging now 5.4 cm central liver mass was done in the hospital,  pathology confirmed cholangiocarcinoma -we are referring him for CT chest to complete staging.  -If chest CT is negative, we discussed the standard is upfront surgical resection. However, with his poor physical condition and urinary issues, surgery would be difficult. He is not an ideal surgical candidate at this point. His daughter is worried he will have complications. The patient is not interested in seeing a surgeon at this point.  -Without surgery, we discussed this is not likely curable but still treatable. We discussed palliative care options including systemic therapy and radiation. Tumor is too large for ablation.  -He is still living at SNF, with low PS and indwelling catheter. He is at high risk for complications from chemo given his current condition.  -If CT chest is negative we are recommending for him to consider radiation as first line treatment. If his condition improves, he may be a candidate for chemotherapy in the future.  -will request molecular studies on his liver biopsy to see if he is a candidate for immunotherapy or other targeted therapy.  Follow-up 09/21/2019 4 pm  Weight changes, if any:   Bowel/Bladder complaints, if any: No  Nausea / Vomiting, if any:   Pain issues, if any:     SAFETY ISSUES:  Prior radiation? No  Pacemaker/ICD? No  Possible current pregnancy? n/a  Is the patient on methotrexate?   Current Complaints/Details: -Lives at [place] on 09/14/2019, lives at home with home care

## 2019-09-17 NOTE — Telephone Encounter (Signed)
Yes, I will. Thanks .   [name] MD

## 2019-09-18 ENCOUNTER — Encounter: Payer: Self-pay | Admitting: Radiation Oncology

## 2019-09-18 ENCOUNTER — Ambulatory Visit
Admission: RE | Admit: 2019-09-18 | Discharge: 2019-09-18 | Disposition: A | Discharge status: Home / Self Care | Payer: Medicare Other | Source: Ambulatory Visit | Attending: Radiation Oncology | Admitting: Radiation Oncology

## 2019-09-18 ENCOUNTER — Other Ambulatory Visit: Payer: Self-pay

## 2019-09-18 VITALS — Ht 68.0 in | Wt 140.0 lb

## 2019-09-18 DIAGNOSIS — C221 Intrahepatic bile duct carcinoma: Secondary | ICD-10-CM

## 2019-09-19 ENCOUNTER — Encounter (HOSPITAL_COMMUNITY): Payer: Self-pay | Admitting: Radiology

## 2019-09-19 ENCOUNTER — Other Ambulatory Visit: Payer: Self-pay

## 2019-09-19 ENCOUNTER — Other Ambulatory Visit: Payer: Self-pay | Admitting: Radiation Oncology

## 2019-09-19 ENCOUNTER — Telehealth: Payer: Self-pay | Admitting: Radiation Oncology

## 2019-09-19 DIAGNOSIS — C221 Intrahepatic bile duct carcinoma: Secondary | ICD-10-CM

## 2019-09-19 NOTE — Telephone Encounter (Signed)
I called the patient's daughter [name] to review GI Oncology Conference discussion this morning. Fortunately the review of his CT chest did not give suspicion for disease, and rather he would be a candidate for 5 fractions of SBRT to the liver with IV contrast. Dr. Laurence Ferrari approved fiducial marker placement and we're trying to get this scheduled. I asked his daughter to call me back as I had to lave a voicemail indicated that I wanted to share information from discussion this morning.     Carola Rhine, PAC

## 2019-09-19 NOTE — Progress Notes (Signed)
Radiation Oncology         [phone] ________________________________  Initial Outpatient Consultation - Conducted via telephone due to current COVID-19 concerns for limiting patient exposure  I spoke with the patient to conduct this consult visit via telephone to spare the patient unnecessary potential exposure in the healthcare setting during the current COVID-19 pandemic. The patient was notified in advance and was offered a [place] meeting to allow for face to face communication but unfortunately reported that they did not have the appropriate resources/technology to support such a visit and instead preferred to proceed with a telephone consult.    Name: Mason Blackburn        MRN: [id number]  Date of Service: 09/18/2019 DOB: December 14, 1945  OV:[id number], No Pcp Per  [name], MD     REFERRING PHYSICIAN: [name], MD   DIAGNOSIS: The encounter diagnosis was Cholangiocarcinoma [facility]).   HISTORY OF PRESENT ILLNESS: Mason Blackburn is a 74 y.o. male seen at the request of Dr. Burr Medico for an intrahepatic cholangiocarcinoma. The patient presented initially with constipation, and urinary obstruction. He had work up for this and this revealed a mass in the left aspect of the liver. Further imaging with MRI on 08/21/19 revealed a 5.4 x 5.4 cm central left liver lobe mass and there was diffuse intrahepatic biliary ductal dilatation in the left lobe. No additional changes were noted. He did have bilateral hydroureteronephrosis of uncertain etiology. He did have small abdominal ascites and small dependent bilateral effusions. A liver biopsy on 08/22/19 revealed adenocarcinoma consistent with cholangiocarcinoma. He discharged to a SNF. He had CT chest on 09/11/19 which revealed bilateral pulmonary nodules, some solid, the larges of these was 6 mm in the LLL, and nonsolid nodules measuring up to 15 mm also in the LLL. He's not a candidate for systemic chemotherapy after meeting with Dr. Burr Medico, and less likely a surgical  candidate. His daughter was contacted today to review the options of possible radiotherapy as the patient was not reachable.    PREVIOUS RADIATION THERAPY: No   PAST MEDICAL HISTORY:  Past Medical History:  Diagnosis Date  . Seizures (HCC)        PAST SURGICAL HISTORY:History reviewed. No pertinent surgical history.   FAMILY HISTORY: History reviewed. No pertinent family history.   SOCIAL HISTORY:  reports that he quit smoking about 4 weeks ago. He quit after 50.00 years of use. He has never used smokeless tobacco. He reports previous alcohol use. He reports that he does not use drugs.   ALLERGIES: Patient has no known allergies.   MEDICATIONS:  Current Outpatient Medications  Medication Sig Dispense Refill  . cephALEXin (KEFLEX) 500 MG capsule Take 1 capsule (500 mg total) by mouth every 12 (twelve) hours. 4 capsule 0  . sulfamethoxazole-trimethoprim (BACTRIM DS) 800-160 MG tablet Take 1 tablet by mouth 2 (two) times daily.    . tamsulosin (FLOMAX) 0.4 MG CAPS capsule Take 0.4 mg by mouth every evening.     . OXcarbazepine (TRILEPTAL) 300 MG/5ML suspension Take 5-10 mLs (300-600 mg total) by mouth 2 (two) times daily for 5 days. Take 300 mg (5 ml) in the morning and Take 600 mg (10 ml) in the evening 250 mL 12   No current facility-administered medications for this encounter.     REVIEW OF SYSTEMS: On review of systems, the patient's daughter states he is feeling better since his hospitalization. He is living again independently but they have home health nurses checking in as well as occupational  therapy several times a week also. She states that she and her brother also check in on him multiple times a week. He is able to provide his own self care and ADLS. No other complaints are noted.    PHYSICAL EXAM:  Unable to assess as the patient was not on our call, and by the encounter type.  ECOG = 1  0 - Asymptomatic (Fully active, able to carry on all predisease activities  without restriction)  1 - Symptomatic but completely ambulatory (Restricted in physically strenuous activity but ambulatory and able to carry out work of a light or sedentary nature. For example, light housework, office work)  2 - Symptomatic, <50% in bed during the day (Ambulatory and capable of all self care but unable to carry out any work activities. Up and about more than 50% of waking hours)  3 - Symptomatic, >50% in bed, but not bedbound (Capable of only limited self-care, confined to bed or chair 50% or more of waking hours)  4 - Bedbound (Completely disabled. Cannot carry on any self-care. Totally confined to bed or chair)  5 - Death   Eustace Pen MM, Creech RH, Tormey DC, et al. [phone]). "Toxicity and response criteria of the [facility]". [place] Oncol. 5 (6): 649-55    LABORATORY DATA:  Lab Results  Component Value Date   WBC 12.6 (H) 09/04/2019   HGB 9.1 (L) 09/04/2019   HCT 28.5 (L) 09/04/2019   MCV 93.8 09/04/2019   PLT 494 (H) 09/04/2019   Lab Results  Component Value Date   NA 141 09/04/2019   K 4.2 09/04/2019   CL 104 09/04/2019   CO2 27 09/04/2019   Lab Results  Component Value Date   ALT 11 09/04/2019   AST 9 (L) 09/04/2019   ALKPHOS 95 09/04/2019   BILITOT 0.3 09/04/2019      RADIOGRAPHY: CT Chest Wo Contrast  Result Date: 09/12/2019 CLINICAL DATA:  Cholangiocarcinoma.  Staging. EXAM: CT CHEST WITHOUT CONTRAST TECHNIQUE: Multidetector CT imaging of the chest was performed following the standard protocol without IV contrast. COMPARISON:  None. FINDINGS: Cardiovascular: The heart size is normal. No substantial pericardial effusion. Coronary artery calcification is evident. Atherosclerotic calcification is noted in the wall of the thoracic aorta. Mediastinum/Nodes: 11 mm short axis subcarinal lymph node is borderline to mildly increased in size. 10 mm precarinal node is upper normal. Possible mild lymphadenopathy in the hilar regions  poorly assessed on this noncontrast study. The esophagus has normal imaging features. There is no axillary lymphadenopathy. Lungs/Pleura: Centrilobular emphsyema noted. Biapical pleuroparenchymal scarring evident. 6 mm sub solid nodule noted posterior right apex on 20/7. 6 mm left lower lobe nodule visible on image 115/7. 15 mm non solid nodule identified left lower lobe on image 109/7. Scattered tiny ground-glass opacities are noted in the lungs bilaterally. No overtly suspicious pulmonary nodule or mass at this time. No focal airspace consolidation. No pleural effusion. Upper Abdomen: Better characterized on abdominal MRI of 08/21/2019. No acute findings. Musculoskeletal: Heterogeneous mineralization without discrete worrisome lytic or sclerotic osseous abnormality. IMPRESSION: 1. Multiple bilateral solid and non solid pulmonary nodules. Dominant solid nodule is 6 mm in the left lower lobe with dominant non solid lesion measuring up to 15 mm, also in the left lower lobe. Close attention on follow-up recommended. 2. Borderline to mild mediastinal lymphadenopathy. Nodular soft tissue in the hilar regions raises the question of adenopathy although is poorly assessed on this noncontrast study. Repeat CT chest with  contrast may prove helpful to further evaluate. As clinically appropriate, this could also be further assessed at the time of follow-up/restaging. 3. Aortic Atherosclerosis (ICD10-I70.0) and Emphysema (ICD10-J43.9). Electronically Signed   By: Misty Stanley M.D.   On: 09/12/2019 09:34   MR ABDOMEN W WO CONTRAST  Addendum Date: 08/21/2019   ADDENDUM REPORT: 08/21/2019 22:02 ADDENDUM: These results were called by telephone at the time of interpretation on 08/21/2019 at 10:02 pm to on call hospitalist provider DR. [facility], who verbally acknowledged these results. Electronically Signed   By: Ilona Sorrel M.D.   On: 08/21/2019 22:02   Result Date: 08/21/2019 CLINICAL DATA:  Indeterminate liver mass on recent CT.  EXAM: MRI ABDOMEN WITHOUT AND WITH CONTRAST TECHNIQUE: Multiplanar multisequence MR imaging of the abdomen was performed both before and after the administration of intravenous contrast. CONTRAST:  3mL GADAVIST GADOBUTROL 1 MMOL/ML IV SOLN COMPARISON:  06/30/2019 CT abdomen/pelvis. 08/19/2019 abdominal sonogram. FINDINGS: Motion degraded MRI, limiting assessment. Lower chest: Small dependent bilateral pleural effusions with dependent bibasilar atelectasis. Hepatobiliary: No hepatic steatosis. Poorly marginated heterogeneously enhancing 5.4 x 5.4 cm posterior central left liver lobe mass (series 15/image 32) with restricted diffusion, mildly increased from 5.0 x 4.7 cm on 06/30/2019 CT abdomen study using similar measurement technique. Moderate diffuse intrahepatic biliary ductal dilatation in the left liver lobe with no intrahepatic biliary ductal dilatation in the right liver lobe. No additional liver masses. Normal nondistended gallbladder with no cholelithiasis. Common bile duct diameter 6 mm, top-normal. No extrahepatic biliary filling defects to suggest choledocholithiasis. Pancreas: No pancreatic mass or duct dilation.  No pancreas divisum. Spleen: Normal size. No mass. Adrenals/Urinary Tract: Normal adrenals. Moderate to severe bilateral hydroureteronephrosis to the level of the pelvic ureters bilaterally. Simple 3.3 cm cystic structure in the interpolar left renal cortex, which may represent a distended caliceal diverticulum (series 24/image 62). Additional subcentimeter simple renal cortical cysts in both kidneys. No suspicious renal masses. Stomach/Bowel: Normal non-distended stomach. Visualized small and large bowel is normal caliber, with no bowel wall thickening. Vascular/Lymphatic: Atherosclerotic nonaneurysmal abdominal aorta. Patent portal, splenic and renal veins. Suggestion of extrinsic narrowing of the left and middle hepatic veins by the liver mass. No pathologically enlarged lymph nodes in the  abdomen. Other: Small volume abdominal ascites.  No focal fluid collections. Musculoskeletal: No aggressive appearing focal osseous lesions. IMPRESSION: 1. Heterogeneously enhancing 5.4 x 5.4 cm posterior central left liver lobe mass, mildly increased in size since 06/30/2019 CT abdomen study. Moderate diffuse intrahepatic biliary ductal dilatation in the left liver lobe. 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 Tissue sampling suggested. 2. No abdominal lymphadenopathy. 3. Moderate to severe bilateral hydroureteronephrosis to the level of the pelvic ureters bilaterally of uncertain etiology. 4. Small volume abdominal ascites. 5. Small dependent bilateral pleural effusions. Electronically Signed: By: Ilona Sorrel M.D. On: 08/21/2019 21:51   US BIOPSY (LIVER)  Result Date: 08/22/2019 INDICATION: Central right liver mass with left biliary obstruction. Concern for central cholangiocarcinoma EXAM: ULTRASOUND GUIDED CORE BIOPSY OF CENTRAL HEPATIC MASS MEDICATIONS: 1% LIDOCAINE LOCAL ANESTHESIA/SEDATION: Versed 1.5mg  IV; Fentanyl 23mcg IV; Moderate Sedation Time:  12 MINUTES The patient was continuously monitored during the procedure by the interventional radiology nurse under my direct supervision. FLUOROSCOPY TIME:  Fluoroscopy Time: NONE. COMPLICATIONS: None immediate. PROCEDURE: The procedure, risks, benefits, and alternatives were explained to the patient. Questions regarding the procedure were encouraged and answered. The patient understands and consents to the procedure. Previous imaging reviewed. Preliminary ultrasound performed through the subxiphoid area. Central  hypoechoic liver mass was localized and marked for an anterior midline approach. Under sterile conditions and local anesthesia, a 17 gauge 11.8 cm access needle was advanced from a subxiphoid anterior oblique approach to the central hypoechoic lesion. Needle position confirmed with ultrasound. Images obtained for  documentation. 2 18 gauge core biopsies obtained. Samples were intact and non fragmented. These were placed in formalin. Needle tract occluded with Gel-Foam. Patient tolerated the procedure well. No immediate complication. FINDINGS: Ultrasound imaging confirms needle placement in the central hypoechoic liver mass for core biopsy. IMPRESSION: Successful ultrasound central liver mass 18 gauge core biopsies Electronically Signed   By: Jerilynn Mages.  Shick M.D.   On: 08/22/2019 13:43   CT IMAGE GUIDED DRAINAGE BY PERCUTANEOUS CATHETER  Result Date: 08/21/2019 INDICATION: 74 year old male with bladder dysfunction and significant urinary retention who presents for placement of a suprapubic catheter. EXAM: CT IMAGE GUIDED DRAINAGE BY PERCUTANEOUS CATHETER COMPARISON:  None. MEDICATIONS: None. ANESTHESIA/SEDATION: Fentanyl 100 mcg IV; Versed 2 mg IV Moderate Sedation Time:  12 minutes The patient was continuously monitored during the procedure by the interventional radiology nurse under my direct supervision. CONTRAST:  None.- administered into the collecting system(s) FLUOROSCOPY TIME:  None. COMPLICATIONS: None immediate. PROCEDURE: Informed written consent was obtained from the patient after a thorough discussion of the procedural risks, benefits and alternatives. All questions were addressed. A timeout was performed prior to the initiation of the procedure. A planning axial CT scan was performed. The distended urinary bladder was identified. A suitable skin entry site was selected and marked. Local anesthesia was attained by infiltration with 1% lidocaine. A small dermatotomy was made. Under intermittent CT guidance, an 18 gauge trocar needle was advanced through the median raphe of the rectus abdominus musculature and into the bladder. A 0.035 wire was then coiled in the bladder. The skin tract was serially dilated to 12 [place] and a 12 [place] all-purpose drainage catheter was advanced over the wire and into the bladder. The  catheter was connected to bag drainage and secured to the skin with 0 Prolene suture. Follow-up CT imaging demonstrates a well-positioned suprapubic catheter. IMPRESSION: Successful placement of a 12 French suprapubic catheter into the urinary bladder. Patient should follow-up with Interventional Radiology in 4-6 weeks for tube exchange and up size. Signed, Criselda Peaches, MD, [facility] Radiology Electronically Signed   By: Jacqulynn Cadet M.D.   On: 08/21/2019 18:21       IMPRESSION/PLAN: 1. Intrahepatic Cholangiocarcinoma. Dr. Lisbeth Renshaw is aware of the patient's case and has reviewed his pathology and imaging studies to date. We will discuss his case in GI oncology conference tomorrow and will reach back out to the patient and his daughter regarding recommendations. I've also reached out to Dr. Annamaria Boots in IR to ask about options to consider fiducial marker placement. I discussed options of possible stereotactic body radiotherapy (SBRT) over 5 fractions if the agreement from conference is that he does not have suspicious appearing disease in the lungs. He is not a candidate for systemic chemotherapy but will continue in follow up with Dr. Burr Medico. I discussed the risks, benefits, short, and long term effects of radiotherapy as well as the delivery and logistics of therapy if he is a candidate to proceed.    Given current concerns for patient exposure during the COVID-19 pandemic, this encounter was conducted via telephone.  The patient has provided two factor identification and has given verbal consent for this type of encounter and has been  advised to only accept a meeting of this type in a secure network environment. The time spent during this encounter was 45 minutes including preparation, discussion, and coordination of the patient's care. The attendants for this meeting include Blenda Nicely, RN and Hayden Pedro  and Mr. Cappelletti  daughter Ivery Quale as he was unreachable. During the encounter,  Blenda Nicely, RN and Hayden Pedro were located at [facility] Radiation Oncology Department.  Deshun Sedivy was located at home, and his daughter [name] was located at her home.     Carola Rhine, PAC

## 2019-09-19 NOTE — Progress Notes (Signed)
Micky Sheller Male, 74 y.o., [birth year] MRN:  [id number] Phone:  [phone] (H) PCP:  Patient, No Pcp Per Primary Cvg:  Medicare/Medicare Part A And B Next Appt With Radiology ([facility] 1) 09/27/2019 at 11:30 AM  RE: US Liver Biopsy Received: Today Jacqulynn Cadet, MD  Markus Daft, MD; Garth Bigness D Approved for fiducial placement at [facility]. Discussed at [facility], just bracket lesion. Schedule for combo CT/US and be sure that we have the fiducials in stock ([place] usually stocks them).    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 Laurence Ferrari in [facility] or [facility] per IR

## 2019-09-21 ENCOUNTER — Encounter: Payer: Self-pay | Admitting: Hematology

## 2019-09-21 ENCOUNTER — Inpatient Hospital Stay: Payer: No Typology Code available for payment source | Attending: Nurse Practitioner | Admitting: Hematology

## 2019-09-21 DIAGNOSIS — C221 Intrahepatic bile duct carcinoma: Secondary | ICD-10-CM

## 2019-09-21 NOTE — Progress Notes (Signed)
Pt scheduled for virtual visit, I was not able to reach pt or his daughter, left a VM, and will reschedule.   Mason Blackburn  09/21/2019

## 2019-09-25 ENCOUNTER — Telehealth: Payer: Self-pay | Admitting: Hematology

## 2019-09-25 NOTE — Telephone Encounter (Signed)
Scheduled per los. Called and left msg. Mailed printout  °

## 2019-09-26 ENCOUNTER — Encounter (HOSPITAL_COMMUNITY): Payer: Self-pay | Admitting: Hematology

## 2019-09-27 ENCOUNTER — Other Ambulatory Visit (HOSPITAL_COMMUNITY): Payer: Medicare Other

## 2019-09-27 ENCOUNTER — Ambulatory Visit (HOSPITAL_COMMUNITY): Payer: Medicare Other

## 2019-10-02 ENCOUNTER — Other Ambulatory Visit: Payer: Self-pay | Admitting: Radiology

## 2019-10-03 ENCOUNTER — Ambulatory Visit: Payer: Medicare Other

## 2019-10-03 ENCOUNTER — Ambulatory Visit (HOSPITAL_COMMUNITY)
Admission: RE | Admit: 2019-10-03 | Discharge: 2019-10-03 | Disposition: A | Discharge status: Home / Self Care | Payer: Medicare Other | Source: Ambulatory Visit | Attending: Radiology | Admitting: Radiology

## 2019-10-03 ENCOUNTER — Encounter (HOSPITAL_COMMUNITY): Payer: Self-pay

## 2019-10-03 ENCOUNTER — Other Ambulatory Visit: Payer: Self-pay

## 2019-10-03 ENCOUNTER — Telehealth: Payer: Self-pay | Admitting: *Deleted

## 2019-10-03 DIAGNOSIS — Z9119 Patient's noncompliance with other medical treatment and regimen: Secondary | ICD-10-CM | POA: Diagnosis not present

## 2019-10-03 DIAGNOSIS — Z79899 Other long term (current) drug therapy: Secondary | ICD-10-CM | POA: Diagnosis not present

## 2019-10-03 DIAGNOSIS — Z8589 Personal history of malignant neoplasm of other organs and systems: Secondary | ICD-10-CM | POA: Insufficient documentation

## 2019-10-03 DIAGNOSIS — R339 Retention of urine, unspecified: Secondary | ICD-10-CM | POA: Insufficient documentation

## 2019-10-03 DIAGNOSIS — R338 Other retention of urine: Secondary | ICD-10-CM

## 2019-10-03 HISTORY — PX: IR CATHETER TUBE CHANGE: IMG717

## 2019-10-03 LAB — CBC WITH DIFFERENTIAL/PLATELET
Abs Immature Granulocytes: 0.01 10*3/uL (ref 0.00–0.07)
Basophils Absolute: 0.2 10*3/uL — ABNORMAL HIGH (ref 0.0–0.1)
Basophils Relative: 2 %
Eosinophils Absolute: 0.6 10*3/uL — ABNORMAL HIGH (ref 0.0–0.5)
Eosinophils Relative: 7 %
HCT: 36.9 % — ABNORMAL LOW (ref 39.0–52.0)
Hemoglobin: 11.6 g/dL — ABNORMAL LOW (ref 13.0–17.0)
Immature Granulocytes: 0 %
Lymphocytes Relative: 21 %
Lymphs Abs: 1.9 10*3/uL (ref 0.7–4.0)
MCH: 29.4 pg (ref 26.0–34.0)
MCHC: 31.4 g/dL (ref 30.0–36.0)
MCV: 93.4 fL (ref 80.0–100.0)
Monocytes Absolute: 0.8 10*3/uL (ref 0.1–1.0)
Monocytes Relative: 9 %
Neutro Abs: 5.4 10*3/uL (ref 1.7–7.7)
Neutrophils Relative %: 61 %
Platelets: 400 10*3/uL (ref 150–400)
RBC: 3.95 MIL/uL — ABNORMAL LOW (ref 4.22–5.81)
RDW: 18.8 % — ABNORMAL HIGH (ref 11.5–15.5)
WBC: 8.8 10*3/uL (ref 4.0–10.5)
nRBC: 0 % (ref 0.0–0.2)

## 2019-10-03 MED ORDER — MIDAZOLAM HCL 2 MG/2ML IJ SOLN
INTRAMUSCULAR | Status: AC
  Filled 2019-10-03: qty 2

## 2019-10-03 MED ORDER — FENTANYL CITRATE (PF) 100 MCG/2ML IJ SOLN
INTRAMUSCULAR | Status: AC | PRN
  Administered 2019-10-03: 25 ug via INTRAVENOUS

## 2019-10-03 MED ORDER — SODIUM CHLORIDE 0.9 % IV SOLN: INTRAVENOUS | Status: DC

## 2019-10-03 MED ORDER — IOHEXOL 300 MG/ML  SOLN
50.0000 mL | Freq: Once | INTRAMUSCULAR | Status: AC | PRN
  Administered 2019-10-03: 15 mL

## 2019-10-03 MED ORDER — CIPROFLOXACIN IN D5W 400 MG/200ML IV SOLN: 400.0000 mg | INTRAVENOUS | Status: DC

## 2019-10-03 MED ORDER — MIDAZOLAM HCL 2 MG/2ML IJ SOLN
INTRAMUSCULAR | Status: AC | PRN
  Administered 2019-10-03: 1 mg via INTRAVENOUS

## 2019-10-03 MED ORDER — FENTANYL CITRATE (PF) 100 MCG/2ML IJ SOLN
INTRAMUSCULAR | Status: AC
  Filled 2019-10-03: qty 2

## 2019-10-03 MED ORDER — LIDOCAINE VISCOUS HCL 2 % MT SOLN
OROMUCOSAL | Status: AC
  Filled 2019-10-03: qty 15

## 2019-10-03 MED ORDER — CIPROFLOXACIN IN D5W 400 MG/200ML IV SOLN
INTRAVENOUS | Status: AC
  Administered 2019-10-03: 400 mg
  Filled 2019-10-03: qty 200

## 2019-10-03 NOTE — H&P (Signed)
Referring Physician(s): Herrick,B  Supervising Physician: Jacqulynn Cadet  Patient Status:  [facility] OP  Chief Complaint:  "I'm getting my bladder tube changed out"  Subjective: Patient familiar to IR service from suprapubic catheter placement on 08/21/2019 and liver lesion biopsy on 08/22/2019.  He has a history of cholangiocarcinoma as well as urinary retention/bladder distention/bilateral mild hydronephrosis/recent UTI/obstructive renal failure.He has been noncompliant with clean intermittent catheterizations and has refused Foley catheter placement yet unable to void adequately on his own.  He was evaluated by urology who requested suprapubic catheter placement which was done on date listed above.  He now returns for suprapubic catheter exchange/upsizing.  He currently denies fever, headache, chest pain, dyspnea, abdominal/back pain, nausea, vomiting or bleeding.  He does have occasional cough and continues to smoke.  Past Medical History:  Diagnosis Date  . Seizures ([place])    History reviewed. No pertinent surgical history.    Allergies: Patient has no known allergies.  Medications: Prior to Admission medications   Medication Sig Start Date End Date Taking? Authorizing Provider  cephALEXin (KEFLEX) 500 MG capsule Take 1 capsule (500 mg total) by mouth every 12 (twelve) hours. 08/21/19   Nita Sells, MD  OXcarbazepine (TRILEPTAL) 300 MG/5ML suspension Take 5-10 mLs (300-600 mg total) by mouth 2 (two) times daily for 5 days. Take 300 mg (5 ml) in the morning and Take 600 mg (10 ml) in the evening 08/21/19 08/26/19  Nita Sells, MD  sulfamethoxazole-trimethoprim (BACTRIM DS) 800-160 MG tablet Take 1 tablet by mouth 2 (two) times daily. 09/05/19   [provider]  tamsulosin (FLOMAX) 0.4 MG CAPS capsule Take 0.4 mg by mouth every evening.  07/17/19   [provider]     Vital Signs: BP 139/84   Pulse 71   Temp 98 F (36.7 C) (Oral)   SpO2 98%    Physical Exam awake, alert.  Chest with distant BS bilat;  Heart with regular rate and rhythm.  Abdomen soft, positive bowel sounds, nontender.  Intact suprapubic catheter draining yellow urine.  No lower extremity edema. Imaging: No results found.  Labs:  CBC: Recent Labs    08/20/19 0307 08/21/19 0306 08/22/19 0337 09/04/19 1153  WBC 17.3* 16.5* 16.3* 12.6*  HGB 9.0* 7.4* 8.1* 9.1*  HCT 27.6* 23.6* 25.1* 28.5*  PLT 742* 681* 723* 494*    COAGS: Recent Labs    08/17/19 1339 08/22/19 0337  INR 1.3* 1.2  APTT 33  --     BMP: Recent Labs    08/20/19 0307 08/21/19 0306 08/22/19 0337 09/04/19 1153  NA 139 142 141 141  K 4.0 3.9 3.7 4.2  CL 115* 119* 117* 104  CO2 16* 18* 16* 27  GLUCOSE 89 100* 87 95  BUN 29* 26* 21 20  CALCIUM 8.1* 7.8* 7.9* 9.6  CREATININE 1.65* 1.32* 1.16 1.04  GFRNONAA 41* 54* >60 >60  GFRAA 47* >60 >60 >60    LIVER FUNCTION TESTS: Recent Labs    08/20/19 0307 08/21/19 0306 08/22/19 0337 09/04/19 1153  BILITOT 0.3 0.3 0.3 0.3  AST 23 14* 11* 9*  ALT 18 13 9 11   ALKPHOS 63 51 51 95  PROT 5.9* 4.9* 5.1* 7.3  ALBUMIN 2.0* 1.7* 1.8* 2.9*    Assessment and Plan:  74 yo male with history of cholangiocarcinoma as well as urinary retention/bladder distention/bilateral mild hydronephrosis/recent UTI/obstructive renal failure. He has been noncompliant with clean intermittent catheterizations and has refused Foley catheter placement yet unable to void adequately  on his own.  He was evaluated by urology who requested suprapubic catheter placement which was done on 08/21/19.  He now returns for suprapubic catheter exchange/upsizing.  Details/risks of procedure, including but not limited to, internal bleeding, infection, injury to adjacent structures discussed with patient with his understanding and consent.   Electronically Signed: D. Rowe Robert, PA-C 10/03/2019, 1:03 PM   I spent a total of 20 minutes at the the patient's bedside AND on  the patient's hospital floor or unit, greater than 50% of which was counseling/coordinating care for image guided suprapubic catheter exchange/upsizing

## 2019-10-03 NOTE — Discharge Instructions (Signed)
Urgent needs - IR on call MD [phone]  Wound - May remove dressing and shower in 24 to 48 hours and replace after cleaning site as noted in text.  Keep site clean and dry.   Do not submerge in tub or water.  Tylenol (if permitted by your provider) or Ice intermittently for discomfort  . Moderate Conscious Sedation, Adult, Care After These instructions provide you with information about caring for yourself after your procedure. Your health care provider may also give you more specific instructions. Your treatment has been planned according to current medical practices, but problems sometimes occur. Call your health care provider if you have any problems or questions after your procedure. What can I expect after the procedure? After your procedure, it is common:  To feel sleepy for several hours.  To feel clumsy and have poor balance for several hours.  To have poor judgment for several hours.  To vomit if you eat too soon. Follow these instructions at home: For at least 24 hours after the procedure:  Do not: ? Participate in activities where you could fall or become injured. ? Drive. ? Use heavy machinery. ? Drink alcohol. ? Take sleeping pills or medicines that cause drowsiness. ? Make important decisions or sign legal documents. ? Take care of children on your own.  Rest. Eating and drinking  Follow the diet recommended by your health care provider.  If you vomit: ? Drink water, juice, or soup when you can drink without vomiting. ? Make sure you have little or no nausea before eating solid foods. General instructions  Have a responsible adult stay with you until you are awake and alert.  Take over-the-counter and prescription medicines only as told by your health care provider.  If you smoke, do not smoke without supervision.  Keep all follow-up visits as told by your health care provider. This is important. Contact a health care provider if:  You keep feeling  nauseous or you keep vomiting.  You feel light-headed.  You develop a rash.  You have a fever. Get help right away if:  You have trouble breathing. This information is not intended to replace advice given to you by your health care provider. Make sure you discuss any questions you have with your health care provider. Document Revised: 02/18/2017 Document Reviewed: 06/28/2015 Elsevier Patient Education  2020 [employer].   Suprapubic Catheter Replacement  Suprapubic catheter replacement is a procedure to remove an old catheter and insert a new, clean catheter. A suprapubic catheter is a rubber tube that drains urine from the bladder into a collection bag outside the body. The catheter is inserted into the bladder through a small opening in the lower abdomen, near the center of the body, above the pubic bone (suprapubicarea). There is a tiny balloon filled with germ-free (sterile) water on the end of the catheter that is in the bladder. The balloon helps to keep the catheter in place. If you need to wear a catheter for a long period of time, you may be instructed to replace the catheter yourself. Usually, suprapubic catheters need to be replaced every 4-6 weeks, or as often as told by your health care provider. What are the risks? Generally, this is a safe procedure. However, problems may occur, including failure to get the catheter into the bladder. What happens before the procedure?  You may have an exam or testing, including a blood or urine sample.  Ask your health care provider what steps will be taken  to help prevent infection. What happens during the procedure?  You will lie on your back.  The water from the balloon will be removed using a syringe.  The catheter will be slowly removed.  Lubricant will be applied to the end of the new catheter that will go into your bladder.  The new catheter will be inserted through the opening in your abdomen. Your health care provider  will slide the catheter into your bladder.  Your health care provider will wait for some urine to start flowing through the catheter. When this happens, a syringe will be used to fill the balloon with sterile water.  A collection bag will be attached to the end of the catheter. The procedure may vary among health care providers and hospitals. What can I expect after procedure? After the procedure, it is common to have:  Some discomfort around the opening in your abdomen. Follow these instructions at home: Caring for the skin around the catheter Use a clean washcloth and soapy water to clean the skin around your catheter every day. Pat the area dry with a clean towel.  Do not pull on the catheter.  Do not use ointment or lotion on this area unless told by your health care provider.  Check the skin around the catheter every day for signs of infection. Check for: ? Redness, swelling, or pain. ? Fluid or blood. ? Warmth. ? Pus or a bad smell. Caring for the catheter  Clean the catheter with soap and water as often as told by your health care provider.  Always make sure there are no twists or curls (kinks) in the catheter.  As soon as you are able to move, you may use a leg bag to collect the urine. ? Make sure that the tubing is straight and without kinks. ? Wrap an ace bandage gently over the tubing and around your leg to minimize the risk of the bag getting pulled out. Emptying the collection bag Empty the large collection bag every 8 hours. Empty the small collection bag when it is about ? full. To empty your large or small collection bag, take the following steps:  Always keep the bag below the level of the catheter. This keeps urine from flowing backward into the catheter.  Hold the bag over the toilet or another container. Turn the valve (spigot) at the bottom of the bag to empty the urine. ? Do not touch the opening of the spigot. ? Do not let the opening touch the toilet or  container.  Close the spigot tightly when the bag is empty. Cleaning the collection bag  Wash your hands with soap and water. If soap and water are not available, use hand sanitizer.  Disconnect the bag from the catheter and immediately attach a new bag to the catheter.  Empty the used bag completely.  Clean the used bag according to the manufacturer's instructions, or as told by your health care provider.  Let the bag dry completely, and put it in a clean plastic bag before storing it. General instructions  Always wash your hands before and after caring for your catheter and collection bag. Use soap and water. If soap and water are not available, use hand sanitizer.  Always make sure there are no leaks in the catheter or collection bag.  Drink enough fluid to keep your urine pale yellow.  If you were prescribed an antibiotic medicine, take it as told by your health care provider. Do not stop taking  the antibiotic even if you start to feel better.  Do not take baths, swim, or use a hot tub.  Keep all follow-up visits as told by your health care provider. This is important. Contact a health care provider if:  You leak urine.  You have redness, swelling, or pain around your catheter opening.  You have fluid or blood coming from your catheter opening.  Your catheter opening feels warm to the touch.  You have pus or a bad smell coming from your catheter opening.  You have a fever or chills.  Your urine flow slows down.  Your urine becomes cloudy or smelly. Get help right away if:  Your catheter comes out.  You feel nauseous.  You have back pain.  You have difficulty changing your catheter.  You have blood in your urine.  You have no urine flow for 1 hour. Summary  Suprapubic catheter replacement is a procedure to remove an old catheter and insert a new, clean catheter.  Make sure that you understand how to care for your catheter, your collection bag, and the  opening in your abdomen.  Always wash your hands before and after caring for your catheter and collection bag.  Contact a health care provider if you leak urine or have a fever or any signs of infection around the catheter opening, or if your urine becomes cloudy or smelly.  Get help right away if your catheter comes out or you have nausea, back pain, blood in your urine, no urine flow for 1 hour, or difficulty changing your catheter. This information is not intended to replace advice given to you by your health care provider. Make sure you discuss any questions you have with your health care provider. Document Revised: 10/26/2017 Document Reviewed: 10/26/2017 Elsevier Patient Education  [place] A suprapubic catheter is a flexible tube that is used to drain urine from the bladder into a collection bag outside the body. The catheter is inserted into the bladder through a small opening in the lower abdomen, above the pubic bone (suprapubic area) and a few inches below your belly button (navel). A tiny balloon filled with germ-free (sterile) water helps to keep the catheter in place. The collection bag must be emptied at least once a day and cleaned at least every other day. The collection bag can be put beside your bed at night and attached to your leg during the day. You may have a large collection bag to use at night and a smaller one to use during the day. Your suprapubic catheter may need to be changed every 4-6 weeks, or as often as recommended by your health care provider. Healing of the tract where the catheter is placed can take 6 weeks to 6 months. During that time, your health care provider may change your catheter. Once the tract is well healed, you or a caregiver will change your suprapubic catheter at home. What are the risks? This catheter is safe to use. However, problems can occur, including:  Blocked urine flow. This can occur if the  catheter stops working, or if you have a blood clot in your bladder or in the catheter.  Irritation of the skin around the catheter.  Infection. This can happen if bacteria gets into your bladder. Supplies needed:  Two pairs of sterile gloves.  Paper towels.  Catheter.  Two syringes.  Sterile water.  Sterile cleaning solution.  Lubricant.  Collection bags. How to change the  catheter 1. Drink plenty of fluids during the hours before you change the catheter. 2. Wash your hands with soap and water. If soap and water are not available, use hand sanitizer. 3. Draw up sterile water into a syringe to have ready to fill the new catheter balloon. The amount will depend on the size of the balloon. 4. Have all of your supplies ready and close to you on a paper towel. 5. Lie on your back, sitting slightly upright so that you can see the catheter and opening. 6. Put on sterile gloves. 7. Clean the skin around the catheter opening using the sterile cleaning solution. 8. Remove the water from the balloon in the catheter using a syringe. 9. Slowly remove the catheter. If the catheter seems stuck, or if you have difficulty removing it: ? Do not pull on it. ? Call your health care provider right away. 10. Place the old catheter on a paper towel to discard later. 11. Take off the used gloves, and put on a new pair. 12. Put lubricant on the end of the new catheter that will go into your bladder. 13. Clean the skin around the catheter opening using the sterile cleaning solution. 14. Gently slide the catheter through the opening in your abdomen and into the tract that leads to your bladder. 15. Wait for some urine to start flowing through the catheter. 16. When urine starts to flow through the catheter, attach the collection bag to the end of the catheter. Make sure the connection is tight. 17. Use a syringe to fill the catheter balloon with sterile water. Fill to the amount directed by your  health care provider. 18. Remove the gloves and wash your hands with soap and water. How to care for the skin around the catheter Follow your health care provider's instructions on caring for your skin.  Use a clean washcloth and soapy water to clean the skin around your catheter every day. Pat the area dry with a clean paper towel.  Do not pull on the catheter.  Do not use ointment or lotion on this area, unless told by your health care provider.  Check the skin around the catheter every day for signs of infection. Check for: ? Redness, swelling, or pain. ? Fluid or blood. ? Warmth. ? Pus or a bad smell. How to empty and clean the collection bag Empty the large collection bag every 8 hours. Empty the small collection bag when it is about ? full. Clean the collection bag every 2-3 days, or as often as told by your health care provider. To do this: 1. Wash your hands with soap and water. If soap and water are not available, use hand sanitizer. 2. Disconnect the bag from the catheter and immediately attach a new bag to the catheter. 3. Hold the used bag over the toilet or another container. 4. Turn the valve (spigot) at the bottom of the bag to empty the urine. Empty the used bag completely. ? Do not touch the opening of the spigot. ? Do not let the opening touch the toilet or container. 5. Close the spigot tightly when the bag is empty. 6. Clean the used bag in one of the following methods: ? According to the manufacturer's instructions. ? As told by your health care provider. 7. Let the bag dry completely. Put it in a clean plastic bag before storing it. General tips  Always wash your hands before and after caring for your catheter and collection bag. Use  a mild, fragrance-free soap. If soap and water are not available, use hand sanitizer.  Clean the outside of the catheter with soap and water as often as told by your health care provider.  Always make sure there are no twists or  kinks in the catheter tube.  Always make sure there are no leaks in the catheter or collection bag.  Always wear the leg bag below your knee.  Make sure the overnight drainage bag is always lower than the level of your bladder, but do not let it touch the floor. Before you go to sleep, hang the bag inside a wastebasket that is covered by a clean plastic bag.  Drink enough fluid to keep your urine pale yellow.  Do not take baths, swim, or use a hot tub until your health care provider approves. Ask your health care provider if you may take showers. Contact a heath care provider if:  You leak urine.  You have redness, swelling, or pain around your catheter.  You have fluid or blood coming from your catheter opening.  Your catheter opening feels warm to the touch.  You have pus or a bad smell coming from your catheter opening.  You have a fever or chills.  Your urine flow slows down.  Your urine becomes cloudy or smelly. Get help right away if:  Your catheter comes out.  You have: ? Nausea. ? Back pain. ? Difficulty changing your catheter. ? Blood in your urine. ? No urine flow for 1 hour. Summary  A suprapubic catheter is a flexible tube that is used to drain urine from the bladder into a collection bag outside the body.  Your suprapubic catheter may need to be changed every 4-6 weeks, or as recommended by your health care provider.  Follow instructions on how to change the catheter and how to empty and clean the collection bag.  Always wash your hands before and after caring for your catheter and collection bag. Drink enough fluid to keep your urine pale yellow.  Get help right away if you have difficulty changing your catheter or if there is blood in your urine. This information is not intended to replace advice given to you by your health care provider. Make sure you discuss any questions you have with your health care provider. Document Revised: 06/29/2018 Document  Reviewed: 04/12/2018 Elsevier Patient Education  [employer].

## 2019-10-03 NOTE — Procedures (Signed)
Interventional Radiology Procedure Note  Procedure: Upsize to 17F suprapubic catheter  Complications: None  Estimated Blood Loss: None  Recommendations: - DC home  Signed,  Criselda Peaches, MD

## 2019-10-03 NOTE — Telephone Encounter (Signed)
Spoke with the patient's daughter to let her know that we need to cancel his CT simulation appointment tomorrow 10/04/2019.  We will reach out to her once he is scheduled to have his fiducial markers placed.  She verbalized understanding.  Will continue to follow as necessary.  Gloriajean Dell. Leonie Green, BSN

## 2019-10-04 ENCOUNTER — Ambulatory Visit: Payer: Medicare Other

## 2019-10-04 ENCOUNTER — Ambulatory Visit: Payer: Medicare Other | Admitting: Radiation Oncology

## 2019-10-04 ENCOUNTER — Telehealth: Payer: Self-pay | Admitting: *Deleted

## 2019-10-04 ENCOUNTER — Inpatient Hospital Stay: Payer: No Typology Code available for payment source | Admitting: Nurse Practitioner

## 2019-10-04 NOTE — Progress Notes (Deleted)
Mason Blackburn   Telephone:([phone] Fax:(336) [phone]   Clinic Follow up Note   Patient Care Team: Patient, No Pcp Per as PCP - General (General Practice) [name], RN as Oncology Nurse Navigator [name], NP as Nurse Practitioner (Nurse Practitioner) [name], MD as Consulting Physician (Oncology) 10/04/2019  CHIEF COMPLAINT: Follow-up cholangiocarcinoma  SUMMARY OF ONCOLOGIC HISTORY: Oncology History  Cholangiocarcinoma ([place])  06/30/2019 Imaging   CT AP IMPRESSION: 1. Distended urinary bladder with findings concerning for bladder outlet obstruction and possible associated cystitis. Correlation with urinalysis recommended. A 6 mm recently passed right renal calculus versus a right UVJ stone. 2. Mild bilateral hydronephrosis likely related to distended urinary bladder. 3. A 4.5 x 2.5 cm irregular hypoenhancing area in the central liver concerning for a malignancy, possibly a cholangiocarcinoma. Clinical correlation and further characterization with MRI/MRCP without and with contrast is recommended. 4. Moderate colonic stool burden. No bowel obstruction. Normal appendix. 5. Aortic Atherosclerosis (ICD10-I70.0).   08/19/2019 Imaging   ABD [place]: Heterogeneous liver echotexture. There is a 3.6 x 4.0 x 4.8 cm hypoechoic mass in the central liver corresponding to the lesion seen on the prior CT and concerning for malignancy. Further evaluation with MRI without and with contrast is recommended if not previously performed. Portal vein is patent on color Doppler imaging with normal direction of blood flow towards the liver. Other: Small ascites, new since the CT. IMPRESSION: 1. Heterogeneous liver with a hypoechoic mass centrally concerning for malignancy. Further characterization with MRI without and with contrast if not previously performed is recommended. 2. Mild intrahepatic biliary ductal dilatation. 3. No gallstone or sonographic evidence of  acute cholecystitis. Contracted gallbladder with small adenomyomatosis. 4. Small ascites, new since the prior CT.       08/21/2019 Imaging   MR ABD IMPRESSION: 1. Heterogeneously enhancing 5.4 x 5.4 cm posterior central left liver lobe mass, mildly increased in size since 06/30/2019 CT abdomen study. Moderate diffuse intrahepatic biliary ductal dilatation in the left liver lobe. 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 Tissue sampling suggested. 2. No abdominal lymphadenopathy. 3. Moderate to severe bilateral hydroureteronephrosis to the level of the pelvic ureters bilaterally of uncertain etiology. 4. Small volume abdominal ascites. 5. Small dependent bilateral pleural effusions.   08/22/2019 Initial Biopsy   FINAL MICROSCOPIC DIAGNOSIS:  A. LIVER, CENTRAL, BIOPSY:  - Adenocarcinoma, see comment.  COMMENT:  By immunohistochemistry the malignant cells are positive for cytokeratin  7 and CDX-2 (focal weak). They are negative for TTF-1 and cytokeratin  20. Given the location and lack of primary these findings would be  compatible with cholangiocarcinoma.    08/22/2019 Initial Diagnosis   Cholangiocarcinoma ([place])   09/04/2019 Cancer Staging   Staging form: Intrahepatic Bile Duct, AJCC 8th Edition - Clinical stage from 09/04/2019: Stage IB (cT1b, cN0, cM0) - Signed by [name], MD on 09/04/2019   09/11/2019 Imaging   CT Chest  IMPRESSION: 1. Multiple bilateral solid and non solid pulmonary nodules. Dominant solid nodule is 6 mm in the left lower lobe with dominant non solid lesion measuring up to 15 mm, also in the left lower lobe. Close attention on follow-up recommended. 2. Borderline to mild mediastinal lymphadenopathy. Nodular soft tissue in the hilar regions raises the question of adenopathy although is poorly assessed on this noncontrast study. Repeat CT chest with contrast may prove helpful to further evaluate. As clinically appropriate, this  could also be further assessed at the time of follow-up/restaging. 3. Aortic Atherosclerosis (  ICD10-I70.0) and Emphysema (ICD10-J43.9).     CURRENT THERAPY:   INTERVAL HISTORY: Mr. Mason Blackburn returns for follow-up as scheduled.  He was last seen for initial consult on 09/04/2019.    REVIEW OF SYSTEMS:   Constitutional: Denies fevers, chills or abnormal weight loss Eyes: Denies blurriness of vision Ears, nose, mouth, throat, and face: Denies mucositis or sore throat Respiratory: Denies cough, dyspnea or wheezes Cardiovascular: Denies palpitation, chest discomfort or lower extremity swelling Gastrointestinal:  Denies nausea, heartburn or change in bowel habits Skin: Denies abnormal skin rashes Lymphatics: Denies new lymphadenopathy or easy bruising Neurological:Denies numbness, tingling or new weaknesses Behavioral/Psych: Mood is stable, no new changes  All other systems were reviewed with the patient and are negative.  MEDICAL HISTORY:  Past Medical History:  Diagnosis Date  . Seizures ([place])     SURGICAL HISTORY: Past Surgical History:  Procedure Laterality Date  . IR CATHETER TUBE CHANGE  10/03/2019    I have reviewed the social history and family history with the patient and they are unchanged from previous note.  ALLERGIES:  has No Known Allergies.  MEDICATIONS:  Current Outpatient Medications  Medication Sig Dispense Refill  . cephALEXin (KEFLEX) 500 MG capsule Take 1 capsule (500 mg total) by mouth every 12 (twelve) hours. 4 capsule 0  . OXcarbazepine (TRILEPTAL) 300 MG/5ML suspension Take 5-10 mLs (300-600 mg total) by mouth 2 (two) times daily for 5 days. Take 300 mg (5 ml) in the morning and Take 600 mg (10 ml) in the evening 250 mL 12  . sulfamethoxazole-trimethoprim (BACTRIM DS) 800-160 MG tablet Take 1 tablet by mouth 2 (two) times daily.    . tamsulosin (FLOMAX) 0.4 MG CAPS capsule Take 0.4 mg by mouth every evening.      No current facility-administered  medications for this visit.    PHYSICAL EXAMINATION: ECOG PERFORMANCE STATUS: {CHL ONC ECOG [phone]}  There were no vitals filed for this visit. There were no vitals filed for this visit.  GENERAL:alert, no distress and comfortable SKIN: skin color, texture, turgor are normal, no rashes or significant lesions EYES: normal, Conjunctiva are pink and non-injected, sclera clear OROPHARYNX:no exudate, no erythema and lips, buccal mucosa, and tongue normal  NECK: supple, thyroid normal size, non-tender, without nodularity LYMPH:  no palpable lymphadenopathy in the cervical, axillary or inguinal LUNGS: clear to auscultation and percussion with normal breathing effort HEART: regular rate & rhythm and no murmurs and no lower extremity edema ABDOMEN:abdomen soft, non-tender and normal bowel sounds Musculoskeletal:no cyanosis of digits and no clubbing  NEURO: alert & oriented x 3 with fluent speech, no focal motor/sensory deficits  LABORATORY DATA:  I have reviewed the data as listed CBC Latest Ref Rng & Units 10/03/2019 09/04/2019 08/22/2019  WBC 4.0 - 10.5 K/uL 8.8 12.6(H) 16.3(H)  Hemoglobin 13.0 - 17.0 g/dL 11.6(L) 9.1(L) 8.1(L)  Hematocrit 39 - 52 % 36.9(L) 28.5(L) 25.1(L)  Platelets 150 - 400 K/uL 400 494(H) 723(H)     CMP Latest Ref Rng & Units 09/04/2019 08/22/2019 08/21/2019  Glucose 70 - 99 mg/dL 95 87 100(H)  BUN 8 - 23 mg/dL 20 21 26(H)  Creatinine 0.61 - 1.24 mg/dL 1.04 1.16 1.32(H)  Sodium 135 - 145 mmol/L 141 141 142  Potassium 3.5 - 5.1 mmol/L 4.2 3.7 3.9  Chloride 98 - 111 mmol/L 104 117(H) 119(H)  CO2 22 - 32 mmol/L 27 16(L) 18(L)  Calcium 8.9 - 10.3 mg/dL 9.6 7.9(L) 7.8(L)  Total Protein 6.5 - 8.1 g/dL 7.3 5.1(L) 4.9(L)  Total Bilirubin 0.3 - 1.2 mg/dL 0.3 0.3 0.3  Alkaline Phos 38 - 126 U/L 95 51 51  AST 15 - 41 U/L 9(L) 11(L) 14(L)  ALT 0 - 44 U/L 11 9 13       RADIOGRAPHIC STUDIES: I have personally reviewed the radiological images as listed and agreed with  the findings in the report. IR Catheter Tube Change  Result Date: 10/03/2019 INDICATION: 74 year old male with chronic urinary retention and an indwelling percutaneous suprapubic catheter. Patient presents for exchange and up size today. EXAM: IR CATHETER TUBE CHANGE COMPARISON:  Suprapubic catheter placement 08/22/2019 MEDICATIONS: None ANESTHESIA/SEDATION: Fentanyl 25 mcg IV; Versed 1 mg IV Moderate Sedation Time:  6 minutes The patient was continuously monitored during the procedure by the interventional radiology nurse under my direct supervision. CONTRAST:  23mL OMNIPAQUE IOHEXOL 300 MG/ML SOLN - administered into the collecting system(s) FLUOROSCOPY TIME:  Fluoroscopy Time:  minutes  seconds ( mGy). COMPLICATIONS: None immediate. PROCEDURE: Informed written consent was obtained from the patient after a thorough discussion of the procedural risks, benefits and alternatives. All questions were addressed. Maximal Sterile Barrier Technique was utilized including caps, mask, sterile gowns, sterile gloves, sterile drape, hand hygiene and skin antiseptic. A timeout was performed prior to the initiation of the procedure. A gentle hand injection of contrast material reveals that the existing 12 [place] all-purpose drainage catheter is well positioned in the bladder. The tube was cut and removed over an Amplatz wire. The skin tract was then serially dilated to 16 [place]. An Entuit 69 French balloon retention percutaneous gastrostomy tube was then advanced over the wire and into the bladder. The retention balloon was inflated with 7 mL saline and pulled snug against the anterior abdominal wall. The external bumper was fixed in place. Contrast injection through the tube confirms its location remains within the urinary bladder. The patient tolerated the procedure well. There was no immediate complication. IMPRESSION: Successful exchange and up size to a 16 French balloon retention Entuit percutaneous gastrostomy tube  which is now within the bladder. Electronically Signed   By: Jacqulynn Cadet M.D.   On: 10/03/2019 14:59     ASSESSMENT & PLAN:  No problem-specific Assessment & Plan notes found for this encounter.   No orders of the defined types were placed in this encounter.  All questions were answered. The patient knows to call the clinic with any problems, questions or concerns. No barriers to learning was detected. I spent {CHL ONC TIME VISIT - GYIRS:[id number]} counseling the patient face to face. The total time spent in the appointment was {CHL ONC TIME VISIT - KXFGH:[id number]} and more than 50% was on counseling and review of test results     [name], NP 10/04/19

## 2019-10-04 NOTE — Telephone Encounter (Signed)
Left a voicemail for the patients daughter to let her know that his appointment to have his fiducial markers placed has been made for 10/10/2019 and they should be calling with more details.  His appointment for his CT simulation has been made for 10/11/2019 at 12:15 pm.  I asked that they arrive about 10 minutes early.  Call back number [phone]) left for further questions.  Will continue to follow as necessary.  Gloriajean Dell. Leonie Green, BSN

## 2019-10-05 ENCOUNTER — Telehealth: Payer: Self-pay | Admitting: Nurse Practitioner

## 2019-10-05 NOTE — Telephone Encounter (Signed)
R/s appt per 7/15 sch msg - unable to reach daughter . Left message with apt date and time

## 2019-10-09 ENCOUNTER — Other Ambulatory Visit: Payer: Self-pay | Admitting: Physician Assistant

## 2019-10-09 ENCOUNTER — Other Ambulatory Visit: Payer: Self-pay | Admitting: Radiation Oncology

## 2019-10-09 DIAGNOSIS — C221 Intrahepatic bile duct carcinoma: Secondary | ICD-10-CM

## 2019-10-09 NOTE — Progress Notes (Deleted)
Mason Blackburn   Telephone:(336) [phone] Fax:(336) [phone]   Clinic Follow up Note   Patient Care Team: Patient, No Pcp Per as PCP - General (General Practice) [name], RN as Oncology Nurse Navigator [name], NP as Nurse Practitioner (Nurse Practitioner) [name], MD as Consulting Physician (Oncology) 10/09/2019  CHIEF COMPLAINT:   SUMMARY OF ONCOLOGIC HISTORY: Oncology History  Cholangiocarcinoma ([place])  06/30/2019 Imaging   CT AP IMPRESSION: 1. Distended urinary bladder with findings concerning for bladder outlet obstruction and possible associated cystitis. Correlation with urinalysis recommended. A 6 mm recently passed right renal calculus versus a right UVJ stone. 2. Mild bilateral hydronephrosis likely related to distended urinary bladder. 3. A 4.5 x 2.5 cm irregular hypoenhancing area in the central liver concerning for a malignancy, possibly a cholangiocarcinoma. Clinical correlation and further characterization with MRI/MRCP without and with contrast is recommended. 4. Moderate colonic stool burden. No bowel obstruction. Normal appendix. 5. Aortic Atherosclerosis (ICD10-I70.0).   08/19/2019 Imaging   ABD [place]: Heterogeneous liver echotexture. There is a 3.6 x 4.0 x 4.8 cm hypoechoic mass in the central liver corresponding to the lesion seen on the prior CT and concerning for malignancy. Further evaluation with MRI without and with contrast is recommended if not previously performed. Portal vein is patent on color Doppler imaging with normal direction of blood flow towards the liver. Other: Small ascites, new since the CT. IMPRESSION: 1. Heterogeneous liver with a hypoechoic mass centrally concerning for malignancy. Further characterization with MRI without and with contrast if not previously performed is recommended. 2. Mild intrahepatic biliary ductal dilatation. 3. No gallstone or sonographic evidence of acute  cholecystitis. Contracted gallbladder with small adenomyomatosis. 4. Small ascites, new since the prior CT.       08/21/2019 Imaging   MR ABD IMPRESSION: 1. Heterogeneously enhancing 5.4 x 5.4 cm posterior central left liver lobe mass, mildly increased in size since 06/30/2019 CT abdomen study. Moderate diffuse intrahepatic biliary ductal dilatation in the left liver lobe. 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 Tissue sampling suggested. 2. No abdominal lymphadenopathy. 3. Moderate to severe bilateral hydroureteronephrosis to the level of the pelvic ureters bilaterally of uncertain etiology. 4. Small volume abdominal ascites. 5. Small dependent bilateral pleural effusions.   08/22/2019 Initial Biopsy   FINAL MICROSCOPIC DIAGNOSIS:  A. LIVER, CENTRAL, BIOPSY:  - Adenocarcinoma, see comment.  COMMENT:  By immunohistochemistry the malignant cells are positive for cytokeratin  7 and CDX-2 (focal weak). They are negative for TTF-1 and cytokeratin  20. Given the location and lack of primary these findings would be  compatible with cholangiocarcinoma.    08/22/2019 Initial Diagnosis   Cholangiocarcinoma ([place])   09/04/2019 Cancer Staging   Staging form: Intrahepatic Bile Duct, AJCC 8th Edition - Clinical stage from 09/04/2019: Stage IB (cT1b, cN0, cM0) - Signed by [name], MD on 09/04/2019   09/11/2019 Imaging   CT Chest  IMPRESSION: 1. Multiple bilateral solid and non solid pulmonary nodules. Dominant solid nodule is 6 mm in the left lower lobe with dominant non solid lesion measuring up to 15 mm, also in the left lower lobe. Close attention on follow-up recommended. 2. Borderline to mild mediastinal lymphadenopathy. Nodular soft tissue in the hilar regions raises the question of adenopathy although is poorly assessed on this noncontrast study. Repeat CT chest with contrast may prove helpful to further evaluate. As clinically appropriate, this could  also be further assessed at the time of follow-up/restaging. 3. Aortic Atherosclerosis (ICD10-I70.0)  and Emphysema (ICD10-J43.9).     CURRENT THERAPY:   INTERVAL HISTORY:   REVIEW OF SYSTEMS:   Constitutional: Denies fevers, chills or abnormal weight loss Eyes: Denies blurriness of vision Ears, nose, mouth, throat, and face: Denies mucositis or sore throat Respiratory: Denies cough, dyspnea or wheezes Cardiovascular: Denies palpitation, chest discomfort or lower extremity swelling Gastrointestinal:  Denies nausea, heartburn or change in bowel habits Skin: Denies abnormal skin rashes Lymphatics: Denies new lymphadenopathy or easy bruising Neurological:Denies numbness, tingling or new weaknesses Behavioral/Psych: Mood is stable, no new changes  All other systems were reviewed with the patient and are negative.  MEDICAL HISTORY:  Past Medical History:  Diagnosis Date  . Seizures ([place])     SURGICAL HISTORY: Past Surgical History:  Procedure Laterality Date  . IR CATHETER TUBE CHANGE  10/03/2019    I have reviewed the social history and family history with the patient and they are unchanged from previous note.  ALLERGIES:  has No Known Allergies.  MEDICATIONS:  Current Outpatient Medications  Medication Sig Dispense Refill  . cephALEXin (KEFLEX) 500 MG capsule Take 1 capsule (500 mg total) by mouth every 12 (twelve) hours. 4 capsule 0  . OXcarbazepine (TRILEPTAL) 300 MG/5ML suspension Take 5-10 mLs (300-600 mg total) by mouth 2 (two) times daily for 5 days. Take 300 mg (5 ml) in the morning and Take 600 mg (10 ml) in the evening 250 mL 12  . sulfamethoxazole-trimethoprim (BACTRIM DS) 800-160 MG tablet Take 1 tablet by mouth 2 (two) times daily.    . tamsulosin (FLOMAX) 0.4 MG CAPS capsule Take 0.4 mg by mouth every evening.      No current facility-administered medications for this visit.    PHYSICAL EXAMINATION: ECOG PERFORMANCE STATUS: {CHL ONC ECOG  PS:[phone]}  There were no vitals filed for this visit. There were no vitals filed for this visit.  GENERAL:alert, no distress and comfortable SKIN: skin color, texture, turgor are normal, no rashes or significant lesions EYES: normal, Conjunctiva are pink and non-injected, sclera clear OROPHARYNX:no exudate, no erythema and lips, buccal mucosa, and tongue normal  NECK: supple, thyroid normal size, non-tender, without nodularity LYMPH:  no palpable lymphadenopathy in the cervical, axillary or inguinal LUNGS: clear to auscultation and percussion with normal breathing effort HEART: regular rate & rhythm and no murmurs and no lower extremity edema ABDOMEN:abdomen soft, non-tender and normal bowel sounds Musculoskeletal:no cyanosis of digits and no clubbing  NEURO: alert & oriented x 3 with fluent speech, no focal motor/sensory deficits  LABORATORY DATA:  I have reviewed the data as listed CBC Latest Ref Rng & Units 10/03/2019 09/04/2019 08/22/2019  WBC 4.0 - 10.5 K/uL 8.8 12.6(H) 16.3(H)  Hemoglobin 13.0 - 17.0 g/dL 11.6(L) 9.1(L) 8.1(L)  Hematocrit 39 - 52 % 36.9(L) 28.5(L) 25.1(L)  Platelets 150 - 400 K/uL 400 494(H) 723(H)     CMP Latest Ref Rng & Units 09/04/2019 08/22/2019 08/21/2019  Glucose 70 - 99 mg/dL 95 87 100(H)  BUN 8 - 23 mg/dL 20 21 26(H)  Creatinine 0.61 - 1.24 mg/dL 1.04 1.16 1.32(H)  Sodium 135 - 145 mmol/L 141 141 142  Potassium 3.5 - 5.1 mmol/L 4.2 3.7 3.9  Chloride 98 - 111 mmol/L 104 117(H) 119(H)  CO2 22 - 32 mmol/L 27 16(L) 18(L)  Calcium 8.9 - 10.3 mg/dL 9.6 7.9(L) 7.8(L)  Total Protein 6.5 - 8.1 g/dL 7.3 5.1(L) 4.9(L)  Total Bilirubin 0.3 - 1.2 mg/dL 0.3 0.3 0.3  Alkaline Phos 38 - 126 U/L 95 51  51  AST 15 - 41 U/L 9(L) 11(L) 14(L)  ALT 0 - 44 U/L 11 9 13       RADIOGRAPHIC STUDIES: I have personally reviewed the radiological images as listed and agreed with the findings in the report. No results found.   ASSESSMENT & PLAN:  No problem-specific  Assessment & Plan notes found for this encounter.   No orders of the defined types were placed in this encounter.  All questions were answered. The patient knows to call the clinic with any problems, questions or concerns. No barriers to learning was detected. I spent {CHL ONC TIME VISIT - ZXYOF:[id number]} counseling the patient face to face. The total time spent in the appointment was {CHL ONC TIME VISIT - KKDPT:[id number]} and more than 50% was on counseling and review of test results     [name], NP 10/09/19

## 2019-10-10 ENCOUNTER — Ambulatory Visit (HOSPITAL_COMMUNITY): Payer: Medicare Other

## 2019-10-10 ENCOUNTER — Other Ambulatory Visit: Payer: Self-pay | Admitting: Student

## 2019-10-11 ENCOUNTER — Ambulatory Visit: Payer: Medicare Other

## 2019-10-11 ENCOUNTER — Ambulatory Visit: Payer: Medicare Other | Admitting: Radiation Oncology

## 2019-10-11 ENCOUNTER — Inpatient Hospital Stay: Payer: No Typology Code available for payment source | Admitting: Nurse Practitioner

## 2019-10-12 ENCOUNTER — Telehealth: Payer: Self-pay | Admitting: *Deleted

## 2019-10-12 NOTE — Telephone Encounter (Signed)
Left a detailed message letting the patients daughter know that his appointments for his fiducial markers and his CT simulation have been rescheduled.  His fiducials will be placed on 7/30.  I asked her to have the patient here on 10/22/2019 at 11:30 for labs followed by IV start and then CT simulation.  Call back number [phone]) left for any questions or concerns.  Will continue to follow as necessary.  Gloriajean Dell. Leonie Green, BSN

## 2019-10-15 ENCOUNTER — Telehealth: Payer: Self-pay

## 2019-10-15 NOTE — Telephone Encounter (Signed)
Left voicemail message with daughter [name] to remind patient has an appointment with Dr. Lisbeth Renshaw at 1:00pm for CT sim. Also advised that patient has an earlier lab appointment as well. Left message for lab appointment also. TM

## 2019-10-16 ENCOUNTER — Other Ambulatory Visit: Payer: Self-pay | Admitting: Radiology

## 2019-10-18 ENCOUNTER — Other Ambulatory Visit: Payer: Self-pay | Admitting: Radiology

## 2019-10-19 ENCOUNTER — Ambulatory Visit (HOSPITAL_COMMUNITY)
Admission: RE | Admit: 2019-10-19 | Discharge: 2019-10-19 | Disposition: A | Discharge status: Home / Self Care | Payer: Medicare Other | Source: Ambulatory Visit | Attending: Radiation Oncology | Admitting: Radiation Oncology

## 2019-10-19 ENCOUNTER — Ambulatory Visit (HOSPITAL_COMMUNITY)
Admission: RE | Admit: 2019-10-19 | Discharge: 2019-10-19 | Disposition: A | Discharge status: Home / Self Care | Payer: Medicare Other | Source: Ambulatory Visit | Attending: Radiology | Admitting: Radiology

## 2019-10-19 ENCOUNTER — Other Ambulatory Visit: Payer: Self-pay

## 2019-10-19 ENCOUNTER — Encounter (HOSPITAL_COMMUNITY): Payer: Self-pay

## 2019-10-19 DIAGNOSIS — Z79899 Other long term (current) drug therapy: Secondary | ICD-10-CM | POA: Diagnosis not present

## 2019-10-19 DIAGNOSIS — C221 Intrahepatic bile duct carcinoma: Secondary | ICD-10-CM

## 2019-10-19 DIAGNOSIS — F17200 Nicotine dependence, unspecified, uncomplicated: Secondary | ICD-10-CM | POA: Insufficient documentation

## 2019-10-19 LAB — CBC WITH DIFFERENTIAL/PLATELET
Abs Immature Granulocytes: 0.02 10*3/uL (ref 0.00–0.07)
Basophils Absolute: 0.2 10*3/uL — ABNORMAL HIGH (ref 0.0–0.1)
Basophils Relative: 1 %
Eosinophils Absolute: 0.5 10*3/uL (ref 0.0–0.5)
Eosinophils Relative: 4 %
HCT: 40.8 % (ref 39.0–52.0)
Hemoglobin: 12.9 g/dL — ABNORMAL LOW (ref 13.0–17.0)
Immature Granulocytes: 0 %
Lymphocytes Relative: 20 %
Lymphs Abs: 2.2 10*3/uL (ref 0.7–4.0)
MCH: 29.5 pg (ref 26.0–34.0)
MCHC: 31.6 g/dL (ref 30.0–36.0)
MCV: 93.4 fL (ref 80.0–100.0)
Monocytes Absolute: 0.8 10*3/uL (ref 0.1–1.0)
Monocytes Relative: 7 %
Neutro Abs: 7.4 10*3/uL (ref 1.7–7.7)
Neutrophils Relative %: 68 %
Platelets: 458 10*3/uL — ABNORMAL HIGH (ref 150–400)
RBC: 4.37 MIL/uL (ref 4.22–5.81)
RDW: 17.5 % — ABNORMAL HIGH (ref 11.5–15.5)
WBC: 11 10*3/uL — ABNORMAL HIGH (ref 4.0–10.5)
nRBC: 0 % (ref 0.0–0.2)

## 2019-10-19 LAB — PROTIME-INR
INR: 1 (ref 0.8–1.2)
Prothrombin Time: 13.2 seconds (ref 11.4–15.2)

## 2019-10-19 MED ORDER — FENTANYL CITRATE (PF) 100 MCG/2ML IJ SOLN
INTRAMUSCULAR | Status: AC | PRN
  Administered 2019-10-19 (×2): 50 ug via INTRAVENOUS

## 2019-10-19 MED ORDER — SODIUM CHLORIDE 0.9 % IV SOLN: INTRAVENOUS | Status: DC

## 2019-10-19 MED ORDER — FENTANYL CITRATE (PF) 100 MCG/2ML IJ SOLN
INTRAMUSCULAR | Status: AC
  Filled 2019-10-19: qty 2

## 2019-10-19 MED ORDER — FENTANYL CITRATE (PF) 100 MCG/2ML IJ SOLN: 50.0000 ug | Freq: Once | INTRAMUSCULAR | Status: DC

## 2019-10-19 MED ORDER — LIDOCAINE HCL 1 % IJ SOLN
INTRAMUSCULAR | Status: AC
  Filled 2019-10-19: qty 20

## 2019-10-19 MED ORDER — FENTANYL CITRATE (PF) 100 MCG/2ML IJ SOLN
25.0000 ug | Freq: Once | INTRAMUSCULAR | Status: AC
  Administered 2019-10-19: 25 ug via INTRAVENOUS

## 2019-10-19 NOTE — Procedures (Signed)
Interventional Radiology Procedure Note  Procedure: [place] fiducial markers around the known central hepatic mass    Complications: None  Estimated Blood Loss:  min  Findings: 3 fiducial placed around the hepatic lesions    M. Daryll Brod, MD

## 2019-10-19 NOTE — H&P (Signed)
Referring Physician(s): Hayden Pedro  Supervising Physician: Daryll Brod  Patient Status:  Mason Blackburn OP  Chief Complaint:  Cholangiocarcinoma  Subjective: Patient familiar to IR service from suprapubic catheter placement on 08/24/2019 followed by exchange and upsizing on 10/03/2019 as well as liver lesion biopsy on 08/22/2019 which revealed cholangiocarcinoma.  He presents again today for image guided fiducial marker placement into the liver tumor prior to SBRT.  He currently denies fever, headache, chest pain worsening dyspnea, abdominal/back pain, nausea, vomiting or bleeding.  He does continue to smoke and has occasional cough.  Additional history as below.  Past Medical History:  Diagnosis Date  . Seizures ([place])    Past Surgical History:  Procedure Laterality Date  . IR CATHETER TUBE CHANGE  10/03/2019       Allergies: Patient has no known allergies.  Medications: Prior to Admission medications   Medication Sig Start Date End Date Taking? Authorizing Provider  cephALEXin (KEFLEX) 500 MG capsule Take 1 capsule (500 mg total) by mouth every 12 (twelve) hours. 08/21/19   Nita Sells, MD  OXcarbazepine (TRILEPTAL) 300 MG/5ML suspension Take 5-10 mLs (300-600 mg total) by mouth 2 (two) times daily for 5 days. Take 300 mg (5 ml) in the morning and Take 600 mg (10 ml) in the evening 08/21/19 08/26/19  Nita Sells, MD  sulfamethoxazole-trimethoprim (BACTRIM DS) 800-160 MG tablet Take 1 tablet by mouth 2 (two) times daily. 09/05/19   [provider]  tamsulosin (FLOMAX) 0.4 MG CAPS capsule Take 0.4 mg by mouth every evening.  07/17/19   [provider]     Vital Signs: Blood pressure 160/93, heart rate 77, temp 98.2, O2 sat 100% room air   Physical Exam awake, alert.  Chest with few scattered rhonchi right base, left clear.  Heart with regular rate and rhythm.  Abdomen soft, positive bowel sounds, nontender.  Intact suprapubic catheter draining  yellow urine.  No lower extremity edema.  Imaging: No results found.  Labs:  CBC: Recent Labs    08/21/19 0306 08/22/19 0337 09/04/19 1153 10/03/19 1300  WBC 16.5* 16.3* 12.6* 8.8  HGB 7.4* 8.1* 9.1* 11.6*  HCT 23.6* 25.1* 28.5* 36.9*  PLT 681* 723* 494* 400    COAGS: Recent Labs    08/17/19 1339 08/22/19 0337  INR 1.3* 1.2  APTT 33  --     BMP: Recent Labs    08/20/19 0307 08/21/19 0306 08/22/19 0337 09/04/19 1153  NA 139 142 141 141  K 4.0 3.9 3.7 4.2  CL 115* 119* 117* 104  CO2 16* 18* 16* 27  GLUCOSE 89 100* 87 95  BUN 29* 26* 21 20  CALCIUM 8.1* 7.8* 7.9* 9.6  CREATININE 1.65* 1.32* 1.16 1.04  GFRNONAA 41* 54* >60 >60  GFRAA 47* >60 >60 >60    LIVER FUNCTION TESTS: Recent Labs    08/20/19 0307 08/21/19 0306 08/22/19 0337 09/04/19 1153  BILITOT 0.3 0.3 0.3 0.3  AST 23 14* 11* 9*  ALT 18 13 9 11   ALKPHOS 63 51 51 95  PROT 5.9* 4.9* 5.1* 7.3  ALBUMIN 2.0* 1.7* 1.8* 2.9*    Assessment and Plan: Patient with history of recently diagnosed cholangiocarcinoma.  Presents today for image guided hepatic fiducial marker placement prior to SBRT.  Details/risks of procedure, including but not limited to, internal bleeding, infection, injury to adjacent structures discussed with patient with his understanding and consent.   Electronically Signed: D. Rowe Robert, PA-C 10/19/2019, 12:10 PM   I spent a  total of 20 minutes at the the patient's bedside AND on the patient's hospital floor or unit, greater than 50% of which was counseling/coordinating care for image guided fiducial marker placement into liver/cholangiocarcinoma

## 2019-10-19 NOTE — Discharge Instructions (Signed)
DO NOT SHOWER X 24 HRS   FOR ANY QUESTIONS OR CONCERNS CALL [phone]  Fiducial markers/ Liver Biopsy, Care After These instructions give you information on caring for yourself after your procedure. Your doctor may also give you more specific instructions. Call your doctor if you have any problems or questions after your procedure. What can I expect after the procedure? After the procedure, it is common to have:  Pain and soreness where the biopsy was done.  Bruising around the area where the biopsy was done.  Sleepiness and be tired for a few days. Follow these instructions at home: Medicines  Take over-the-counter and prescription medicines only as told by your doctor.  If you were prescribed an antibiotic medicine, take it as told by your doctor. Do not stop taking the antibiotic even if you start to feel better.  Do not take medicines such as aspirin and ibuprofen. These medicines can thin your blood. Do not take these medicines unless your doctor tells you to take them.  If you are taking prescription pain medicine, take actions to prevent or treat constipation. Your doctor may recommend that you: ? Drink enough fluid to keep your pee (urine) clear or pale yellow. ? Take over-the-counter or prescription medicines. ? Eat foods that are high in fiber, such as fresh fruits and vegetables, whole grains, and beans. ? Limit foods that are high in fat and processed sugars, such as fried and sweet foods. Caring for your cut  Follow instructions from your doctor about how to take care of your cuts from surgery (incisions). Make sure you: ? Wash your hands with soap and water before you change your bandage (dressing). If you cannot use soap and water, use hand sanitizer. ? Change your bandage as told by your doctor. ? Leave stitches (sutures), skin glue, or skin tape (adhesive) strips in place. They may need to stay in place for 2 weeks or longer. If tape strips get loose and curl up,  you may trim the loose edges. Do not remove tape strips completely unless your doctor says it is okay.  Check your cuts every day for signs of infection. Check for: ? Redness, swelling, or more pain. ? Fluid or blood. ? Pus or a bad smell. ? Warmth.  Do not take baths, swim, or use a hot tub until your doctor says it is okay to do so. Activity   Rest at home for 1-2 days or as told by your doctor. ? Avoid sitting for a long time without moving. Get up to take short walks every 1-2 hours.  Return to your normal activities as told by your doctor. Ask what activities are safe for you.  Do not do these things in the first 24 hours: ? Drive. ? Use machinery. ? Take a bath or shower.  Do not lift more than 10 pounds (4.5 kg) or play contact sports for the first 2 weeks. General instructions   Do not drink alcohol in the first week after the procedure.  Have someone stay with you for at least 24 hours after the procedure.  Get your test results. Ask your doctor or the department that is doing the test: ? When will my results be ready? ? How will I get my results? ? What are my treatment options? ? What other tests do I need? ? What are my next steps?  Keep all follow-up visits as told by your doctor. This is important. Contact a doctor if:  A cut  bleeds and leaves more than just a small spot of blood.  A cut is red, puffs up (swells), or hurts more than before.  Fluid or something else comes from a cut.  A cut smells bad.  You have a fever or chills. Get help right away if:  You have swelling, bloating, or pain in your belly (abdomen).  You get dizzy or faint.  You have a rash.  You feel sick to your stomach (nauseous) or throw up (vomit).  You have trouble breathing, feel short of breath, or feel faint.  Your chest hurts.  You have problems talking or seeing.  You have trouble with your balance or moving your arms or legs. Summary  After the procedure, it  is common to have pain, soreness, bruising, and tiredness.  Your doctor will tell you how to take care of yourself at home. Change your bandage, take your medicines, and limit your activities as told by your doctor.  Call your doctor if you have symptoms of infection. Get help right away if your belly swells, your cut bleeds a lot, or you have trouble talking or breathing. This information is not intended to replace advice given to you by your health care provider. Make sure you discuss any questions you have with your health care provider. Document Revised: 03/18/2017 Document Reviewed: 03/18/2017 Elsevier Patient Education  [place]. Moderate Conscious Sedation, Adult, Care After These instructions provide you with information about caring for yourself after your procedure. Your health care provider may also give you more specific instructions. Your treatment has been planned according to current medical practices, but problems sometimes occur. Call your health care provider if you have any problems or questions after your procedure. What can I expect after the procedure? After your procedure, it is common:  To feel sleepy for several hours.  To feel clumsy and have poor balance for several hours.  To have poor judgment for several hours.  To vomit if you eat too soon. Follow these instructions at home: For at least 24 hours after the procedure:   Do not: ? Participate in activities where you could fall or become injured. ? Drive. ? Use heavy machinery. ? Drink alcohol. ? Take sleeping pills or medicines that cause drowsiness. ? Make important decisions or sign legal documents. ? Take care of children on your own.  Rest. Eating and drinking  Follow the diet recommended by your health care provider.  If you vomit: ? Drink water, juice, or soup when you can drink without vomiting. ? Make sure you have little or no nausea before eating solid foods. General  instructions  Have a responsible adult stay with you until you are awake and alert.  Take over-the-counter and prescription medicines only as told by your health care provider.  If you smoke, do not smoke without supervision.  Keep all follow-up visits as told by your health care provider. This is important. Contact a health care provider if:  You keep feeling nauseous or you keep vomiting.  You feel light-headed.  You develop a rash.  You have a fever. Get help right away if:  You have trouble breathing. This information is not intended to replace advice given to you by your health care provider. Make sure you discuss any questions you have with your health care provider. Document Revised: 02/18/2017 Document Reviewed: 06/28/2015 Elsevier Patient Education  2020 [employer].

## 2019-10-22 ENCOUNTER — Other Ambulatory Visit: Payer: Self-pay

## 2019-10-22 ENCOUNTER — Ambulatory Visit
Admission: RE | Admit: 2019-10-22 | Discharge: 2019-10-22 | Disposition: A | Discharge status: Home / Self Care | Payer: Medicare Other | Source: Ambulatory Visit | Attending: Radiation Oncology | Admitting: Radiation Oncology

## 2019-10-22 VITALS — BP 144/95 | HR 67 | Temp 97.9°F | Resp 18 | Ht 68.0 in | Wt 137.0 lb

## 2019-10-22 DIAGNOSIS — C221 Intrahepatic bile duct carcinoma: Secondary | ICD-10-CM

## 2019-10-22 LAB — CMP (CANCER CENTER ONLY)
ALT: 8 U/L (ref 0–44)
AST: 13 U/L — ABNORMAL LOW (ref 15–41)
Albumin: 3.9 g/dL (ref 3.5–5.0)
Alkaline Phosphatase: 59 U/L (ref 38–126)
Anion gap: 9 (ref 5–15)
BUN: 23 mg/dL (ref 8–23)
CO2: 25 mmol/L (ref 22–32)
Calcium: 10.4 mg/dL — ABNORMAL HIGH (ref 8.9–10.3)
Chloride: 107 mmol/L (ref 98–111)
Creatinine: 1 mg/dL (ref 0.61–1.24)
GFR, Est AFR Am: >60 mL/min (ref >60)
GFR, Estimated: >60 mL/min (ref >60)
Glucose, Bld: 83 mg/dL (ref 70–99)
Potassium: 4 mmol/L (ref 3.5–5.1)
Sodium: 141 mmol/L (ref 135–145)
Total Bilirubin: 0.2 mg/dL — ABNORMAL LOW (ref 0.3–1.2)
Total Protein: 7.5 g/dL (ref 6.5–8.1)

## 2019-10-22 LAB — CBC WITH DIFFERENTIAL (CANCER CENTER ONLY)
Abs Immature Granulocytes: 0.03 10*3/uL (ref 0.00–0.07)
Basophils Absolute: 0.1 10*3/uL (ref 0.0–0.1)
Basophils Relative: 1 %
Eosinophils Absolute: 0.6 10*3/uL — ABNORMAL HIGH (ref 0.0–0.5)
Eosinophils Relative: 5 %
HCT: 39.8 % (ref 39.0–52.0)
Hemoglobin: 12.9 g/dL — ABNORMAL LOW (ref 13.0–17.0)
Immature Granulocytes: 0 %
Lymphocytes Relative: 22 %
Lymphs Abs: 2.4 10*3/uL (ref 0.7–4.0)
MCH: 29.8 pg (ref 26.0–34.0)
MCHC: 32.4 g/dL (ref 30.0–36.0)
MCV: 91.9 fL (ref 80.0–100.0)
Monocytes Absolute: 0.7 10*3/uL (ref 0.1–1.0)
Monocytes Relative: 7 %
Neutro Abs: 6.8 10*3/uL (ref 1.7–7.7)
Neutrophils Relative %: 65 %
Platelet Count: 430 10*3/uL — ABNORMAL HIGH (ref 150–400)
RBC: 4.33 MIL/uL (ref 4.22–5.81)
RDW: 17.1 % — ABNORMAL HIGH (ref 11.5–15.5)
WBC Count: 10.6 10*3/uL — ABNORMAL HIGH (ref 4.0–10.5)
nRBC: 0 % (ref 0.0–0.2)

## 2019-10-22 LAB — IRON AND TIBC
Iron: 43 ug/dL (ref 42–163)
Saturation Ratios: 16 % — ABNORMAL LOW (ref 20–55)
TIBC: 274 ug/dL (ref 202–409)
UIBC: 230 ug/dL (ref 117–376)

## 2019-10-22 LAB — FERRITIN: Ferritin: 162 ng/mL (ref 24–336)

## 2019-10-22 MED ORDER — SODIUM CHLORIDE 0.9% FLUSH
10.0000 mL | Freq: Once | INTRAVENOUS | Status: AC
  Administered 2019-10-22: 10 mL via INTRAVENOUS

## 2019-10-22 NOTE — Progress Notes (Signed)
The patient came in for simulation today.  He needs the assistance of his daughter to help with decision-making, and I was able to contact her by phone, through video chat we were also able for the patient to sign written consent.  The patient will proceed with simulation for his treatment to the liver.  He is scheduled to begin treatment next week

## 2019-10-22 NOTE — Progress Notes (Signed)
Has armband been applied?  Yes  Does patient have an allergy to IV contrast dye?: No   Has patient ever received premedication for IV contrast dye?:  n/a  Does patient take metformin?: no  If patient does take metformin when was the last dose: n/a  Date of lab work: 10/22/2019 BUN: 23 CR: 1.00 Egfr: >60  IV site: Left Forearm  Has IV site been added to flowsheet?  Yes

## 2019-10-23 ENCOUNTER — Telehealth: Payer: Self-pay | Admitting: Hematology

## 2019-10-23 ENCOUNTER — Telehealth: Payer: Self-pay

## 2019-10-23 LAB — CANCER ANTIGEN 19-9: CA 19-9: 7 U/mL (ref 0–35)

## 2019-10-23 NOTE — Telephone Encounter (Signed)
I called [name] Pt daughter (Number on file to call) and asked her to give [place] a call back.

## 2019-10-23 NOTE — Telephone Encounter (Signed)
-----   Message from [name], MD sent at 10/23/2019  9:49 AM EDT ----- My nurse, I am not sure who scheduled his lab, let him know his lab looks OK. He is starting radiation, please schedule him to see me on his last radiation on 8/20, thanks   [name]

## 2019-10-23 NOTE — Telephone Encounter (Signed)
Scheduled appt per 8/2 sch msg - left message for patient with appt date and time   

## 2019-10-23 NOTE — Telephone Encounter (Signed)
Attempted to call Mason Blackburn (Mr. Mason Blackburn daughter) again and no answer. Asked Mason Blackburn to give the office an call back when she gets the message.

## 2019-10-30 DIAGNOSIS — C221 Intrahepatic bile duct carcinoma: Secondary | ICD-10-CM | POA: Diagnosis not present

## 2019-10-31 ENCOUNTER — Other Ambulatory Visit: Payer: Self-pay

## 2019-10-31 ENCOUNTER — Ambulatory Visit
Admission: RE | Admit: 2019-10-31 | Discharge: 2019-10-31 | Disposition: A | Discharge status: Home / Self Care | Payer: Medicare Other | Source: Ambulatory Visit | Attending: Radiation Oncology | Admitting: Radiation Oncology

## 2019-10-31 DIAGNOSIS — C221 Intrahepatic bile duct carcinoma: Secondary | ICD-10-CM | POA: Diagnosis not present

## 2019-11-02 ENCOUNTER — Other Ambulatory Visit: Payer: Self-pay

## 2019-11-02 ENCOUNTER — Ambulatory Visit
Admission: RE | Admit: 2019-11-02 | Discharge: 2019-11-02 | Disposition: A | Discharge status: Home / Self Care | Payer: Medicare Other | Source: Ambulatory Visit | Attending: Radiation Oncology | Admitting: Radiation Oncology

## 2019-11-02 DIAGNOSIS — C221 Intrahepatic bile duct carcinoma: Secondary | ICD-10-CM | POA: Diagnosis not present

## 2019-11-05 ENCOUNTER — Other Ambulatory Visit: Payer: Self-pay

## 2019-11-05 ENCOUNTER — Ambulatory Visit
Admission: RE | Admit: 2019-11-05 | Discharge: 2019-11-05 | Disposition: A | Discharge status: Home / Self Care | Payer: Medicare Other | Source: Ambulatory Visit | Attending: Radiation Oncology | Admitting: Radiation Oncology

## 2019-11-05 DIAGNOSIS — C221 Intrahepatic bile duct carcinoma: Secondary | ICD-10-CM | POA: Diagnosis not present

## 2019-11-06 ENCOUNTER — Ambulatory Visit: Payer: Medicare Other | Admitting: Radiation Oncology

## 2019-11-07 ENCOUNTER — Other Ambulatory Visit: Payer: Self-pay

## 2019-11-07 ENCOUNTER — Ambulatory Visit
Admission: RE | Admit: 2019-11-07 | Discharge: 2019-11-07 | Disposition: A | Discharge status: Home / Self Care | Payer: Medicare Other | Source: Ambulatory Visit | Attending: Radiation Oncology | Admitting: Radiation Oncology

## 2019-11-07 DIAGNOSIS — C221 Intrahepatic bile duct carcinoma: Secondary | ICD-10-CM | POA: Diagnosis not present

## 2019-11-09 ENCOUNTER — Ambulatory Visit: Payer: Medicare Other | Admitting: Hematology

## 2019-11-09 ENCOUNTER — Ambulatory Visit: Payer: Medicare Other | Admitting: Radiation Oncology

## 2019-11-12 ENCOUNTER — Other Ambulatory Visit: Payer: Self-pay

## 2019-11-12 ENCOUNTER — Inpatient Hospital Stay: Payer: No Typology Code available for payment source | Attending: Nurse Practitioner | Admitting: Hematology

## 2019-11-12 ENCOUNTER — Ambulatory Visit
Admission: RE | Admit: 2019-11-12 | Discharge: 2019-11-12 | Disposition: A | Discharge status: Home / Self Care | Payer: Medicare Other | Source: Ambulatory Visit | Attending: Radiation Oncology | Admitting: Radiation Oncology

## 2019-11-12 ENCOUNTER — Encounter: Payer: Self-pay | Admitting: Radiation Oncology

## 2019-11-12 VITALS — BP 126/89 | HR 105 | Temp 97.7°F | Resp 18 | Ht 68.0 in | Wt 132.6 lb

## 2019-11-12 DIAGNOSIS — R918 Other nonspecific abnormal finding of lung field: Secondary | ICD-10-CM | POA: Insufficient documentation

## 2019-11-12 DIAGNOSIS — C221 Intrahepatic bile duct carcinoma: Secondary | ICD-10-CM

## 2019-11-12 DIAGNOSIS — N136 Pyonephrosis: Secondary | ICD-10-CM | POA: Insufficient documentation

## 2019-11-12 DIAGNOSIS — R569 Unspecified convulsions: Secondary | ICD-10-CM | POA: Insufficient documentation

## 2019-11-12 DIAGNOSIS — Z960 Presence of urogenital implants: Secondary | ICD-10-CM | POA: Insufficient documentation

## 2019-11-12 DIAGNOSIS — D649 Anemia, unspecified: Secondary | ICD-10-CM | POA: Insufficient documentation

## 2019-11-12 DIAGNOSIS — D473 Essential (hemorrhagic) thrombocythemia: Secondary | ICD-10-CM | POA: Diagnosis not present
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
## 2019-11-12 NOTE — Progress Notes (Signed)
  Radiation Oncology         [phone] ________________________________  Name: Mason Blackburn MRN: [id number]  Date of Service: 11/12/2019  DOB: 1945/11/12  Post Treatment Note  Diagnosis:    Intrahepatic Cholangiocarcinoma  Interval Since Last Radiation:  Today  10/31/19-11/12/19 SBRT Treatment: The liver target was treated to 60 Gy in 5 fractions  Narrative:  The patient was contacted today for routine follow-up. During treatment he did very well with radiotherapy and did not have significant desquamation. He reports he has done well. His daughter who is on speaker phone today states he had some abdominal pain following his first two treatments but not since. Today he denies abdominal pain, nausea, or vomiting and reports he is eating pretty well. No other complaints are verbalized.   Physical Exam: Wt Readings from Last 3 Encounters:  11/12/19 132 lb 9.6 oz (60.1 kg)  10/22/19 137 lb (62.1 kg)  10/03/19 130 lb (59 kg)   Temp Readings from Last 3 Encounters:  11/12/19 97.7 F (36.5 C) (Tympanic)  10/22/19 97.9 F (36.6 C) (Oral)  10/19/19 98.3 F (36.8 C) (Oral)   BP Readings from Last 3 Encounters:  11/12/19 126/89  10/22/19 (!) 144/95  10/19/19 (!) 152/79   Pulse Readings from Last 3 Encounters:  11/12/19 (!) 105  10/22/19 67  10/19/19 64   In general this is a thin, elderly appearing caucasian male in no acute distress. He's alert and oriented x4 and appropriate throughout the examination. Cardiopulmonary assessment is negative for acute distress and he exhibits normal effort.    Impression/Plan: 1.  Intrahepatic Cholangiocarcinoma. The patient has been doing well since completion of radiotherapy. We discussed that we would be happy to continue to follow him as needed, but he will also continue to follow up with Dr. Burr Medico in medical oncology and if he has significant improvement in disease, Dr. Burr Medico plans to have him meet with Dr. Barry Dienes.     Carola Rhine,  PAC

## 2019-11-13 ENCOUNTER — Encounter: Payer: Self-pay | Admitting: Hematology

## 2019-11-19 ENCOUNTER — Ambulatory Visit: Payer: Medicare Other | Admitting: Hematology

## 2019-12-05 NOTE — Progress Notes (Signed)
  Radiation Oncology         [phone] ________________________________  Name: Mason Blackburn MRN: [id number]  Date: 11/12/2019  DOB: 1945/10/22  End of Treatment Note  Diagnosis:      Cholangiocarcinoma  Indication for treatment:  Curative       Radiation treatment dates:   10/31/19 - 11/12/19  Site/dose:   The tumor in the liver was treated with a course of stereotactic body radiation treatment. The patient received 60 Gy In 5 fractions at 12 G per fraction.  Narrative: The patient tolerated radiation treatment relatively well.   The patient did not have any signs of acute toxicity during treatment.  Plan: The patient has completed radiation treatment. The patient will return to radiation oncology clinic for routine followup in one month. I advised the patient to call or return sooner if they have any questions or concerns related to their recovery or treatment.   ------------------------------------------------  Jodelle Gross, MD, PhD

## 2019-12-10 ENCOUNTER — Inpatient Hospital Stay: Payer: No Typology Code available for payment source | Attending: Nurse Practitioner | Admitting: Hematology

## 2019-12-10 ENCOUNTER — Inpatient Hospital Stay: Payer: No Typology Code available for payment source

## 2019-12-12 ENCOUNTER — Telehealth: Payer: Self-pay | Admitting: Hematology

## 2019-12-12 NOTE — Telephone Encounter (Signed)
Called pt per 9/21 sch msg- left message for daughter to call back to reschedule missed appt .

## 2019-12-17 ENCOUNTER — Encounter: Payer: Self-pay | Admitting: Hematology

## 2019-12-17 ENCOUNTER — Telehealth: Payer: Self-pay | Admitting: Radiation Oncology

## 2019-12-17 NOTE — Telephone Encounter (Signed)
  Radiation Oncology         [phone] ________________________________  Name: Mason Blackburn MRN: [id number]  Date of Service: 12/17/2019  DOB: March 26, 1945  Post Treatment Telephone Note  Diagnosis:   Intrahepatic Cholangiocarcinoma  Interval Since Last Radiation:  5 weeks   10/31/19 - 11/12/19 SBRT Treatment: The tumor in the liver was treated with a course of stereotactic body radiation treatment. The patient received 60 Gy In 5 fractions at 12 G per fraction.  Narrative:  The patient was contacted today for routine follow-up. During treatment he did very well with radiotherapy and did not have significant desquamation. He has not followed up yet with Dr. Burr Medico.  Impression/Plan: 1. Intrahepatic Cholangiocarcinoma. The patient's daughter [name] was contacted by phone but I had to leave a her a voicemail and on the message I discussed that we would be happy to continue to follow him as needed, but he will also continue to follow up with Dr. Burr Medico in medical oncology. I will reach out to Dr. Ernestina Penna nurse to coordinate follow up.    Carola Rhine, PAC

## 2019-12-18 ENCOUNTER — Telehealth: Payer: Self-pay

## 2019-12-18 NOTE — Telephone Encounter (Signed)
I left a vm for Ms Mason Blackburn, Mr Mason Blackburn's daughter to call me back.

## 2020-01-29 ENCOUNTER — Telehealth: Payer: Self-pay | Admitting: Nurse Practitioner

## 2020-01-29 NOTE — Telephone Encounter (Signed)
Patient lost to f/u. I called Mr. Sokolov # which is his daughter [name]'s phone, left a message requesting for her to call back to schedule a follow up appointment.   Cira Rue, NP
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
## 2020-03-26 ENCOUNTER — Ambulatory Visit: Payer: Medicare Other | Admitting: Hematology

## 2020-03-26 ENCOUNTER — Telehealth: Payer: Self-pay

## 2020-03-26 ENCOUNTER — Other Ambulatory Visit: Payer: Medicare Other

## 2020-03-26 NOTE — Telephone Encounter (Signed)
I left a messge on [name]'s cell phone letting her know MR Noone had an appt with [place] today.  I enquired as to his status and asked her to contact us.

## 2020-04-01 ENCOUNTER — Telehealth: Payer: Self-pay

## 2020-04-01 NOTE — Telephone Encounter (Signed)
error 
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
## 2020-04-14 ENCOUNTER — Inpatient Hospital Stay (HOSPITAL_BASED_OUTPATIENT_CLINIC_OR_DEPARTMENT_OTHER): Payer: Medicare Other | Admitting: Hematology

## 2020-04-14 ENCOUNTER — Other Ambulatory Visit: Payer: Self-pay

## 2020-04-14 ENCOUNTER — Encounter: Payer: Self-pay | Admitting: Hematology

## 2020-04-14 ENCOUNTER — Inpatient Hospital Stay: Payer: Medicare Other | Attending: Hematology

## 2020-04-14 VITALS — BP 158/98 | HR 72 | Temp 97.8°F | Resp 18 | Ht 68.0 in | Wt 125.9 lb

## 2020-04-14 DIAGNOSIS — R918 Other nonspecific abnormal finding of lung field: Secondary | ICD-10-CM | POA: Diagnosis not present

## 2020-04-14 DIAGNOSIS — G40909 Epilepsy, unspecified, not intractable, without status epilepticus: Secondary | ICD-10-CM | POA: Diagnosis not present

## 2020-04-14 DIAGNOSIS — C221 Intrahepatic bile duct carcinoma: Secondary | ICD-10-CM

## 2020-04-14 DIAGNOSIS — Z79899 Other long term (current) drug therapy: Secondary | ICD-10-CM | POA: Diagnosis not present

## 2020-04-14 DIAGNOSIS — Z923 Personal history of irradiation: Secondary | ICD-10-CM | POA: Diagnosis not present

## 2020-04-14 LAB — CMP (CANCER CENTER ONLY)
ALT: 12 U/L (ref 0–44)
AST: 19 U/L (ref 15–41)
Albumin: 4 g/dL (ref 3.5–5.0)
Alkaline Phosphatase: 83 U/L (ref 38–126)
Anion gap: 7 (ref 5–15)
BUN: 19 mg/dL (ref 8–23)
CO2: 29 mmol/L (ref 22–32)
Calcium: 9.6 mg/dL (ref 8.9–10.3)
Chloride: 107 mmol/L (ref 98–111)
Creatinine: 0.98 mg/dL (ref 0.61–1.24)
GFR, Estimated: >60 mL/min (ref >60)
Glucose, Bld: 100 mg/dL — ABNORMAL HIGH (ref 70–99)
Potassium: 3.5 mmol/L (ref 3.5–5.1)
Sodium: 143 mmol/L (ref 135–145)
Total Bilirubin: 0.5 mg/dL (ref 0.3–1.2)
Total Protein: 7.1 g/dL (ref 6.5–8.1)

## 2020-04-14 LAB — CBC WITH DIFFERENTIAL (CANCER CENTER ONLY)
Abs Immature Granulocytes: 0.03 10*3/uL (ref 0.00–0.07)
Basophils Absolute: 0.1 10*3/uL (ref 0.0–0.1)
Basophils Relative: 2 %
Eosinophils Absolute: 0.4 10*3/uL (ref 0.0–0.5)
Eosinophils Relative: 4 %
HCT: 45.6 % (ref 39.0–52.0)
Hemoglobin: 15.1 g/dL (ref 13.0–17.0)
Immature Granulocytes: 0 %
Lymphocytes Relative: 15 %
Lymphs Abs: 1.4 10*3/uL (ref 0.7–4.0)
MCH: 31.3 pg (ref 26.0–34.0)
MCHC: 33.1 g/dL (ref 30.0–36.0)
MCV: 94.4 fL (ref 80.0–100.0)
Monocytes Absolute: 0.5 10*3/uL (ref 0.1–1.0)
Monocytes Relative: 6 %
Neutro Abs: 6.6 10*3/uL (ref 1.7–7.7)
Neutrophils Relative %: 73 %
Platelet Count: 256 10*3/uL (ref 150–400)
RBC: 4.83 MIL/uL (ref 4.22–5.81)
RDW: 14.1 % (ref 11.5–15.5)
WBC Count: 9.1 10*3/uL (ref 4.0–10.5)
nRBC: 0 % (ref 0.0–0.2)

## 2020-04-14 LAB — IRON AND TIBC
Iron: 72 ug/dL (ref 42–163)
Saturation Ratios: 30 % (ref 20–55)
TIBC: 240 ug/dL (ref 202–409)
UIBC: 168 ug/dL (ref 117–376)

## 2020-04-14 LAB — FERRITIN: Ferritin: 203 ng/mL (ref 24–336)

## 2020-04-15 ENCOUNTER — Telehealth: Payer: Self-pay | Admitting: Nurse Practitioner

## 2020-04-15 ENCOUNTER — Telehealth: Payer: Self-pay

## 2020-04-15 LAB — CANCER ANTIGEN 19-9: CA 19-9: 4 U/mL (ref 0–35)

## 2020-04-15 NOTE — Telephone Encounter (Signed)
I spoke with Mason Blackburn and reviewed CT scan appt, instructions and phone visit appt with Dr Burr Medico. She verbalized understanding.

## 2020-04-15 NOTE — Telephone Encounter (Signed)
Appts requested in 1/24 LOS were already made by desk nurse.
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
## 2020-04-22 ENCOUNTER — Ambulatory Visit (HOSPITAL_COMMUNITY)
Admission: RE | Admit: 2020-04-22 | Discharge: 2020-04-22 | Disposition: A | Discharge status: Home / Self Care | Payer: Medicare Other | Source: Ambulatory Visit | Attending: Hematology | Admitting: Hematology

## 2020-04-22 ENCOUNTER — Other Ambulatory Visit: Payer: Self-pay

## 2020-04-22 ENCOUNTER — Encounter (HOSPITAL_COMMUNITY): Payer: Self-pay

## 2020-04-22 DIAGNOSIS — C221 Intrahepatic bile duct carcinoma: Secondary | ICD-10-CM | POA: Diagnosis not present

## 2020-04-22 MED ORDER — IOHEXOL 300 MG/ML  SOLN
100.0000 mL | Freq: Once | INTRAMUSCULAR | Status: AC | PRN
  Administered 2020-04-22: 80 mL via INTRAVENOUS

## 2020-04-23 ENCOUNTER — Encounter (HOSPITAL_COMMUNITY): Payer: Self-pay

## 2020-04-23 ENCOUNTER — Ambulatory Visit: Payer: Medicare Other | Admitting: Hematology

## 2020-04-23 ENCOUNTER — Other Ambulatory Visit: Payer: Self-pay

## 2020-04-23 ENCOUNTER — Other Ambulatory Visit: Payer: Self-pay | Admitting: Hematology

## 2020-04-23 ENCOUNTER — Ambulatory Visit (HOSPITAL_COMMUNITY)
Admission: RE | Admit: 2020-04-23 | Discharge: 2020-04-23 | Disposition: A | Discharge status: Home / Self Care | Payer: Medicare Other | Source: Ambulatory Visit | Attending: Hematology | Admitting: Hematology

## 2020-04-23 DIAGNOSIS — C221 Intrahepatic bile duct carcinoma: Secondary | ICD-10-CM | POA: Insufficient documentation

## 2020-04-23 MED ORDER — IOHEXOL 300 MG/ML  SOLN
75.0000 mL | Freq: Once | INTRAMUSCULAR | Status: AC | PRN
  Administered 2020-04-23: 75 mL via INTRAVENOUS

## 2020-04-23 NOTE — Progress Notes (Signed)
The proposed treatment discussed in conference is for discussion purposes only and is not a binding recommendation.  The patients have not been physically examined, or presented with their treatment options.  Therefore, final treatment plans cannot be decided.   

## 2020-04-24 ENCOUNTER — Inpatient Hospital Stay: Payer: Medicare Other | Attending: Hematology | Admitting: Hematology

## 2020-04-24 DIAGNOSIS — C221 Intrahepatic bile duct carcinoma: Secondary | ICD-10-CM

## 2020-04-25 ENCOUNTER — Encounter: Payer: Self-pay | Admitting: Hematology

## 2020-04-28 ENCOUNTER — Telehealth: Payer: Self-pay | Admitting: Hematology

## 2020-04-28 NOTE — Telephone Encounter (Signed)
Scheduled follow-up appointment per 2/3 los. Patient's daughter-in-law is aware.
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
## 2020-06-23 ENCOUNTER — Inpatient Hospital Stay: Payer: Medicare Other

## 2020-06-23 ENCOUNTER — Inpatient Hospital Stay: Payer: Medicare Other | Attending: Hematology | Admitting: Hematology

## 2020-06-23 ENCOUNTER — Encounter: Payer: Self-pay | Admitting: Hematology

## 2020-06-23 ENCOUNTER — Other Ambulatory Visit: Payer: Self-pay

## 2020-06-23 ENCOUNTER — Telehealth: Payer: Self-pay | Admitting: Hematology

## 2020-06-23 VITALS — BP 122/58 | HR 73 | Temp 97.0°F | Resp 18 | Ht 68.0 in | Wt 130.8 lb

## 2020-06-23 DIAGNOSIS — R918 Other nonspecific abnormal finding of lung field: Secondary | ICD-10-CM | POA: Insufficient documentation

## 2020-06-23 DIAGNOSIS — G40909 Epilepsy, unspecified, not intractable, without status epilepticus: Secondary | ICD-10-CM | POA: Diagnosis not present

## 2020-06-23 DIAGNOSIS — C221 Intrahepatic bile duct carcinoma: Secondary | ICD-10-CM | POA: Insufficient documentation

## 2020-06-23 LAB — CBC WITH DIFFERENTIAL (CANCER CENTER ONLY)
Abs Immature Granulocytes: 0.03 10*3/uL (ref 0.00–0.07)
Basophils Absolute: 0.1 10*3/uL (ref 0.0–0.1)
Basophils Relative: 1 %
Eosinophils Absolute: 0.4 10*3/uL (ref 0.0–0.5)
Eosinophils Relative: 4 %
HCT: 43.8 % (ref 39.0–52.0)
Hemoglobin: 14.3 g/dL (ref 13.0–17.0)
Immature Granulocytes: 0 %
Lymphocytes Relative: 18 %
Lymphs Abs: 1.7 10*3/uL (ref 0.7–4.0)
MCH: 31.4 pg (ref 26.0–34.0)
MCHC: 32.6 g/dL (ref 30.0–36.0)
MCV: 96.1 fL (ref 80.0–100.0)
Monocytes Absolute: 0.8 10*3/uL (ref 0.1–1.0)
Monocytes Relative: 9 %
Neutro Abs: 6.5 10*3/uL (ref 1.7–7.7)
Neutrophils Relative %: 68 %
Platelet Count: 291 10*3/uL (ref 150–400)
RBC: 4.56 MIL/uL (ref 4.22–5.81)
RDW: 14.5 % (ref 11.5–15.5)
WBC Count: 9.6 10*3/uL (ref 4.0–10.5)
nRBC: 0 % (ref 0.0–0.2)

## 2020-06-23 LAB — CMP (CANCER CENTER ONLY)
ALT: 10 U/L (ref 0–44)
AST: 14 U/L — ABNORMAL LOW (ref 15–41)
Albumin: 3.8 g/dL (ref 3.5–5.0)
Alkaline Phosphatase: 77 U/L (ref 38–126)
Anion gap: 11 (ref 5–15)
BUN: 32 mg/dL — ABNORMAL HIGH (ref 8–23)
CO2: 24 mmol/L (ref 22–32)
Calcium: 9 mg/dL (ref 8.9–10.3)
Chloride: 108 mmol/L (ref 98–111)
Creatinine: 1.02 mg/dL (ref 0.61–1.24)
GFR, Estimated: >60 mL/min (ref >60)
Glucose, Bld: 92 mg/dL (ref 70–99)
Potassium: 3.8 mmol/L (ref 3.5–5.1)
Sodium: 143 mmol/L (ref 135–145)
Total Bilirubin: 0.3 mg/dL (ref 0.3–1.2)
Total Protein: 6.9 g/dL (ref 6.5–8.1)

## 2020-06-23 NOTE — Telephone Encounter (Signed)
Scheduled follow-up appointment per 4/4 los. Patient is aware. 

## 2020-09-09 ENCOUNTER — Telehealth: Payer: Self-pay | Admitting: Hematology

## 2020-09-09 NOTE — Telephone Encounter (Signed)
Rescheduled upcoming appointment due to provider's PAL. Patient's daughter-in-law is aware of changes.

## 2020-09-24 ENCOUNTER — Other Ambulatory Visit: Payer: Medicare Other

## 2020-09-24 ENCOUNTER — Ambulatory Visit: Payer: Medicare Other | Admitting: Hematology

## 2020-09-25 ENCOUNTER — Other Ambulatory Visit: Payer: Self-pay | Admitting: Nurse Practitioner

## 2020-09-25 DIAGNOSIS — C221 Intrahepatic bile duct carcinoma: Secondary | ICD-10-CM

## 2020-09-26 ENCOUNTER — Encounter: Payer: Self-pay | Admitting: Hematology

## 2020-09-26 ENCOUNTER — Other Ambulatory Visit: Payer: Self-pay

## 2020-09-26 ENCOUNTER — Inpatient Hospital Stay (HOSPITAL_BASED_OUTPATIENT_CLINIC_OR_DEPARTMENT_OTHER): Payer: Medicare Other | Admitting: Hematology

## 2020-09-26 ENCOUNTER — Inpatient Hospital Stay: Payer: Medicare Other | Attending: Hematology

## 2020-09-26 ENCOUNTER — Telehealth: Payer: Self-pay | Admitting: Hematology

## 2020-09-26 VITALS — BP 122/84 | HR 71 | Temp 97.8°F | Resp 17 | Ht 68.0 in | Wt 132.9 lb

## 2020-09-26 DIAGNOSIS — C221 Intrahepatic bile duct carcinoma: Secondary | ICD-10-CM | POA: Diagnosis present

## 2020-09-26 DIAGNOSIS — F17200 Nicotine dependence, unspecified, uncomplicated: Secondary | ICD-10-CM | POA: Diagnosis not present

## 2020-09-26 DIAGNOSIS — R918 Other nonspecific abnormal finding of lung field: Secondary | ICD-10-CM | POA: Insufficient documentation

## 2020-09-26 LAB — CMP (CANCER CENTER ONLY)
ALT: 25 U/L (ref 0–44)
AST: 24 U/L (ref 15–41)
Albumin: 3.2 g/dL — ABNORMAL LOW (ref 3.5–5.0)
Alkaline Phosphatase: 81 U/L (ref 38–126)
Anion gap: 8 (ref 5–15)
BUN: 13 mg/dL (ref 8–23)
CO2: 26 mmol/L (ref 22–32)
Calcium: 8.7 mg/dL — ABNORMAL LOW (ref 8.9–10.3)
Chloride: 108 mmol/L (ref 98–111)
Creatinine: 0.85 mg/dL (ref 0.61–1.24)
GFR, Estimated: >60 mL/min (ref >60)
Glucose, Bld: 125 mg/dL — ABNORMAL HIGH (ref 70–99)
Potassium: 3.2 mmol/L — ABNORMAL LOW (ref 3.5–5.1)
Sodium: 142 mmol/L (ref 135–145)
Total Bilirubin: 0.5 mg/dL (ref 0.3–1.2)
Total Protein: 6.3 g/dL — ABNORMAL LOW (ref 6.5–8.1)

## 2020-09-26 LAB — CBC WITH DIFFERENTIAL (CANCER CENTER ONLY)
Abs Immature Granulocytes: 0.03 10*3/uL (ref 0.00–0.07)
Basophils Absolute: 0.1 10*3/uL (ref 0.0–0.1)
Basophils Relative: 1 %
Eosinophils Absolute: 0.5 10*3/uL (ref 0.0–0.5)
Eosinophils Relative: 5 %
HCT: 39.1 % (ref 39.0–52.0)
Hemoglobin: 13.5 g/dL (ref 13.0–17.0)
Immature Granulocytes: 0 %
Lymphocytes Relative: 17 %
Lymphs Abs: 1.6 10*3/uL (ref 0.7–4.0)
MCH: 32.1 pg (ref 26.0–34.0)
MCHC: 34.5 g/dL (ref 30.0–36.0)
MCV: 92.9 fL (ref 80.0–100.0)
Monocytes Absolute: 0.7 10*3/uL (ref 0.1–1.0)
Monocytes Relative: 7 %
Neutro Abs: 7 10*3/uL (ref 1.7–7.7)
Neutrophils Relative %: 70 %
Platelet Count: 155 10*3/uL (ref 150–400)
RBC: 4.21 MIL/uL — ABNORMAL LOW (ref 4.22–5.81)
RDW: 14.4 % (ref 11.5–15.5)
WBC Count: 9.9 10*3/uL (ref 4.0–10.5)
nRBC: 0 % (ref 0.0–0.2)

## 2020-09-26 NOTE — Telephone Encounter (Signed)
Scheduled per los. Gave avs and calendar  
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
## 2020-09-27 ENCOUNTER — Encounter: Payer: Self-pay | Admitting: Hematology

## 2020-09-27 LAB — CANCER ANTIGEN 19-9: CA 19-9: 4 U/mL (ref 0–35)

## 2020-11-27 ENCOUNTER — Other Ambulatory Visit: Payer: Medicare Other

## 2020-12-01 ENCOUNTER — Ambulatory Visit: Payer: Medicare Other | Admitting: Hematology

## 2021-09-01 IMAGING — US US ABDOMEN LIMITED
1 series · 13 of 25 positions shown · non-contrast
Comparison: CT abdomen pelvis dated 06/30/2019.

CLINICAL DATA: 74-year-old male with liver mass.

EXAM:
ULTRASOUND ABDOMEN LIMITED RIGHT UPPER QUADRANT

[Series 1: us abdomen limited · 62 acquisitions, 13 frames shown]
[im 1/62]
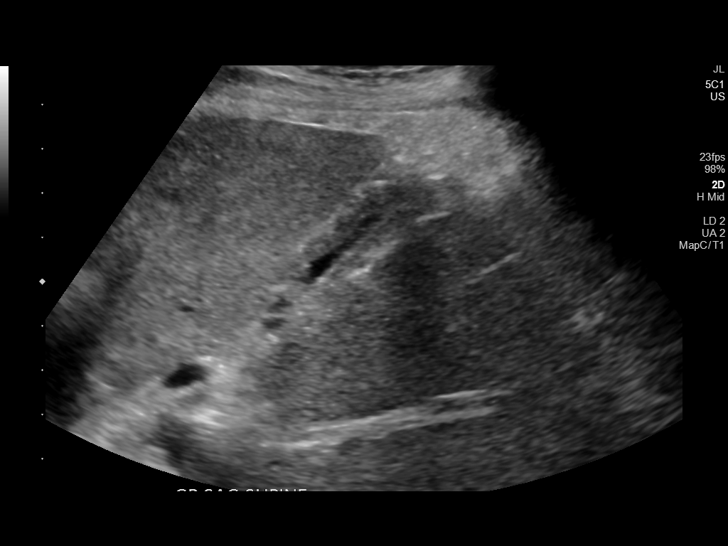
[im 6/62]
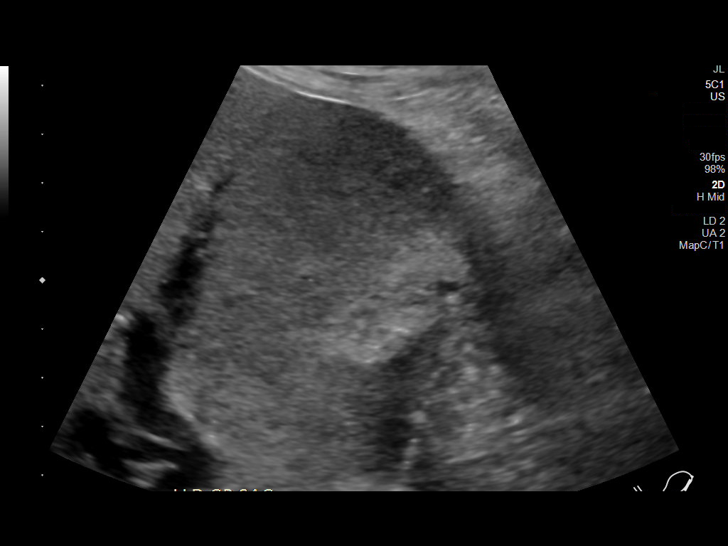
[im 11/62]
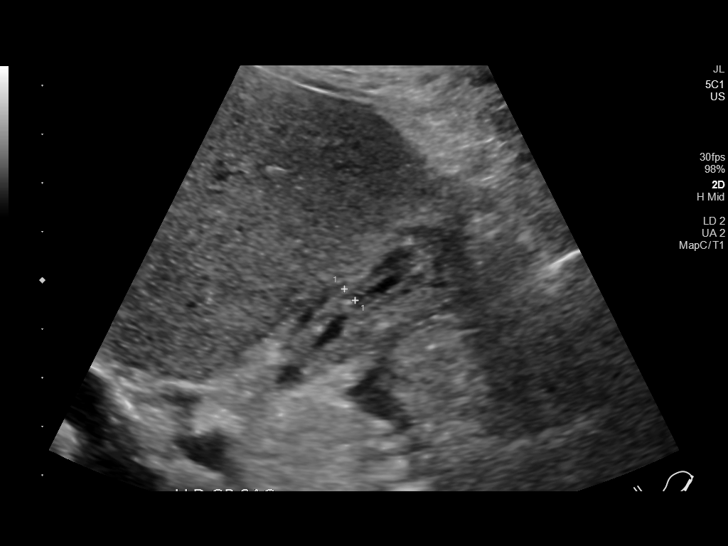
[im 16/62]
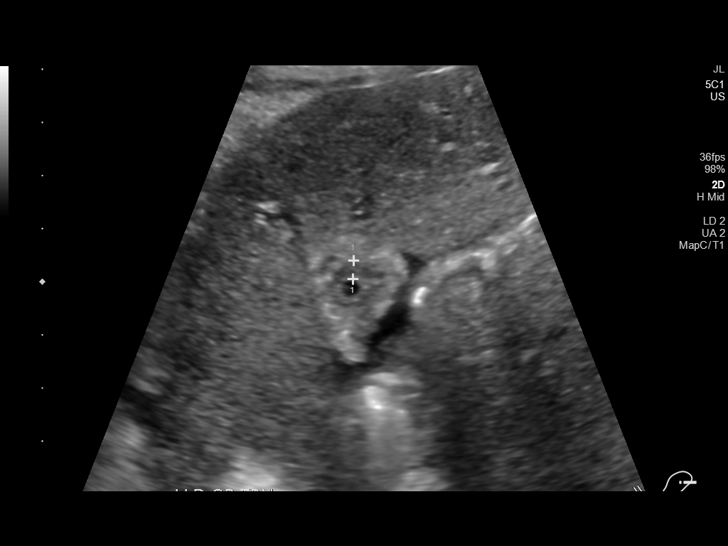
[im 21/62]
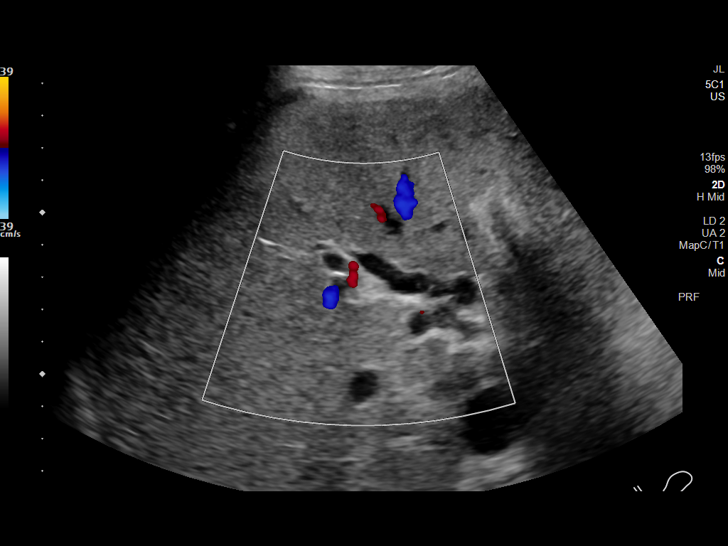
[im 26/62]
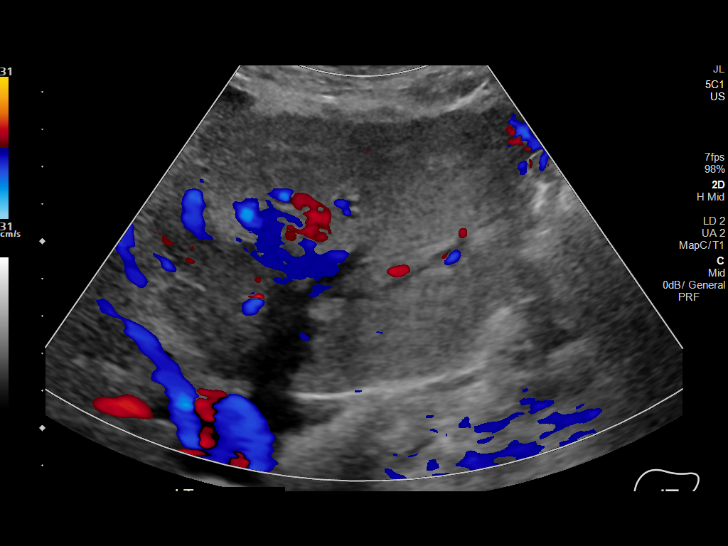
[im 31/62]
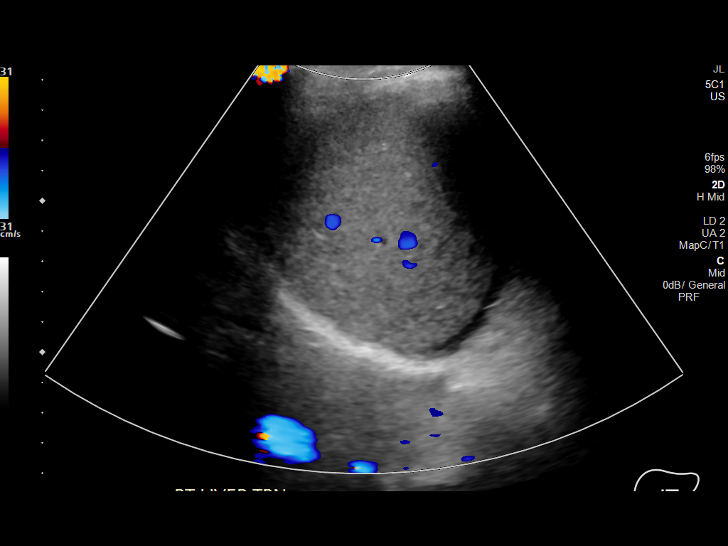
[im 36/62]
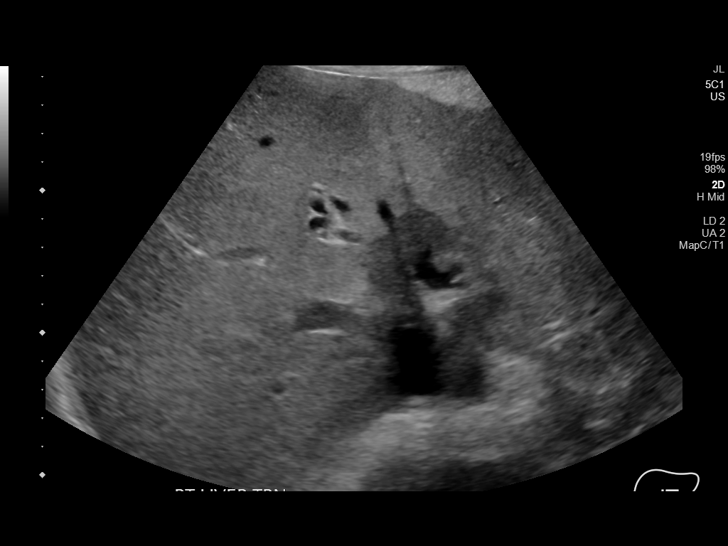
[im 41/62]
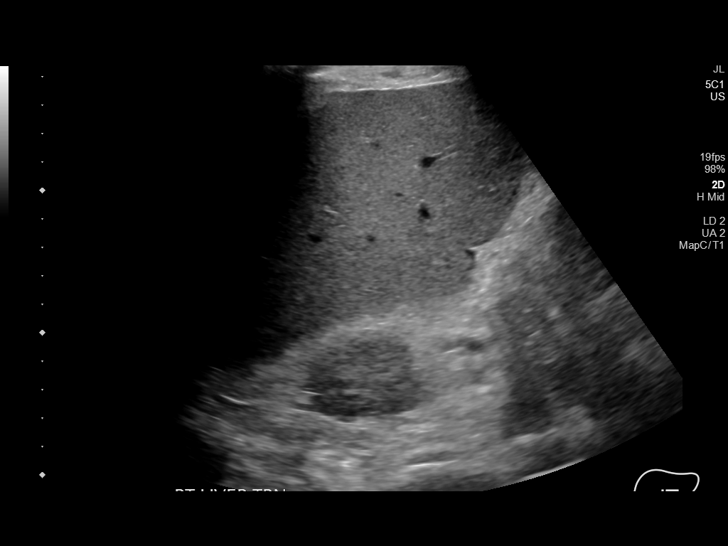
[im 46/62]
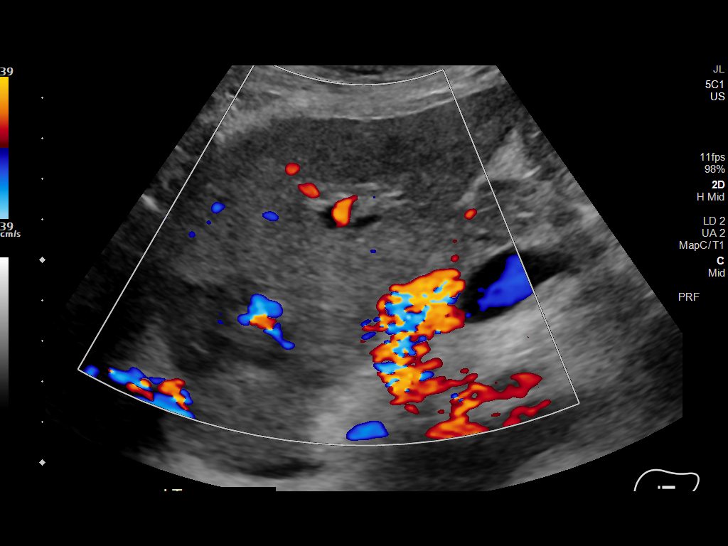
[im 51/62]
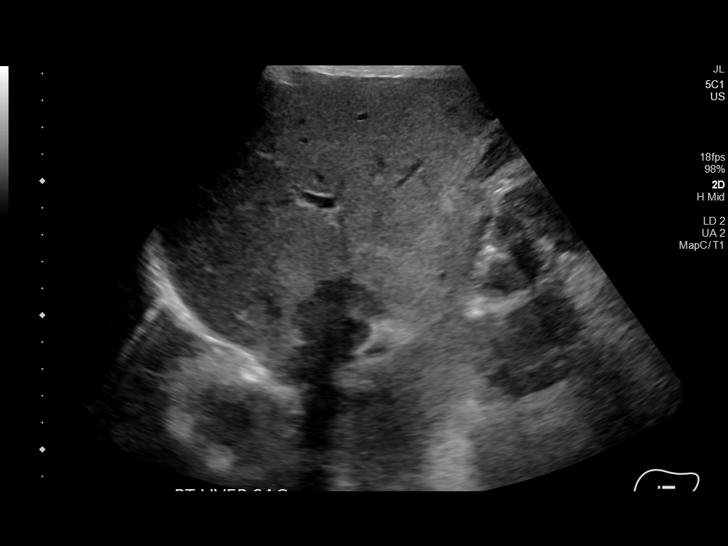
[im 56/62]
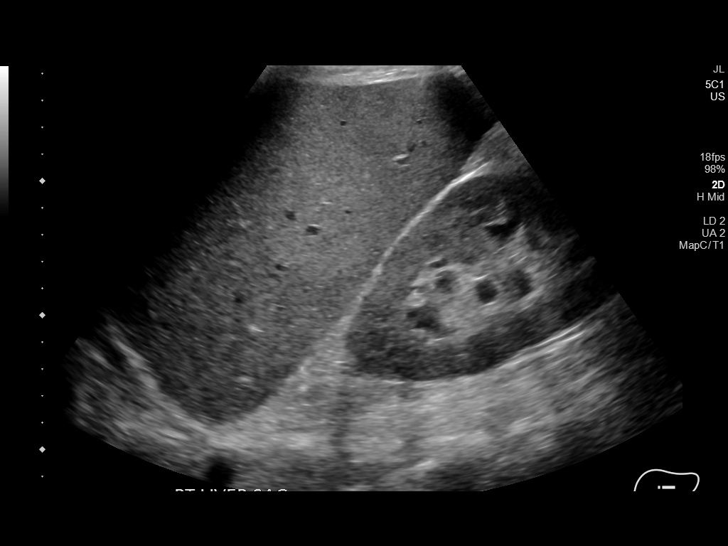
[im 62/62]
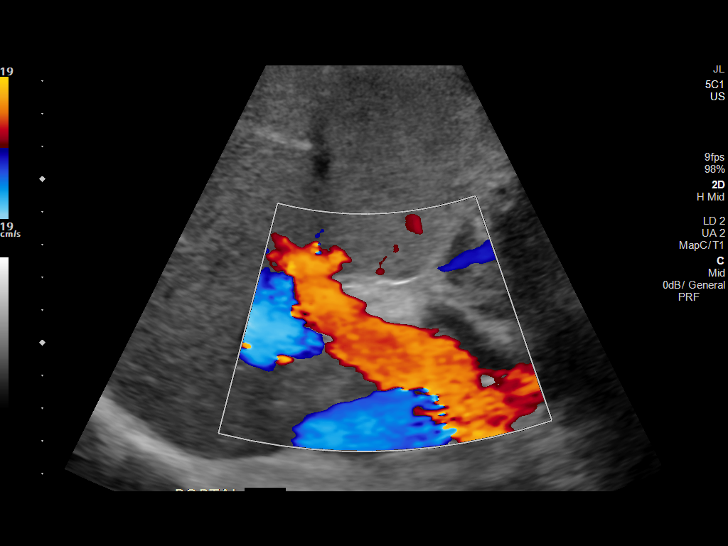

[13 of 25 positions shown; findings below may reference images not displayed]

FINDINGS: Gallbladder:

The gallbladder is contracted. Echogenic focus with comet tail
artifact along the gallbladder wall most consistent with
adenomyomatosis. No gallstone, pericholecystic fluid, or other
sonographic findings of acute cholecystitis. Negative sonographic
Murphy's sign.

Common bile duct:

Diameter: 8 mm. There is mild intrahepatic biliary ductal
dilatation.

Liver:

Heterogeneous liver echotexture. There is a 3.6 x 4.0 x 4.8 cm
hypoechoic mass in the central liver corresponding to the lesion
seen on the prior CT and concerning for malignancy. Further
evaluation with MRI without and with contrast is recommended if not
previously performed. Portal vein is patent on color Doppler imaging
with normal direction of blood flow towards the liver.

Other: Small ascites, new since the CT.
IMPRESSION: 1. Heterogeneous liver with a hypoechoic mass centrally concerning
for malignancy. Further characterization with MRI without and with
contrast if not previously performed is recommended.
2. Mild intrahepatic biliary ductal dilatation.
3. No gallstone or sonographic evidence of acute cholecystitis.
Contracted gallbladder with small adenomyomatosis.
4. Small ascites, new since the prior CT.

## 2021-09-04 IMAGING — US US BIOPSY CORE LIVER
1 series · 13 of 25 positions shown · non-contrast
Comparison: none

INDICATION: Central right liver mass with left biliary obstruction. Concern for
central cholangiocarcinoma

[Series 1: us biopsy core liver · 13 of 25 slices shown]
[im 1/25]
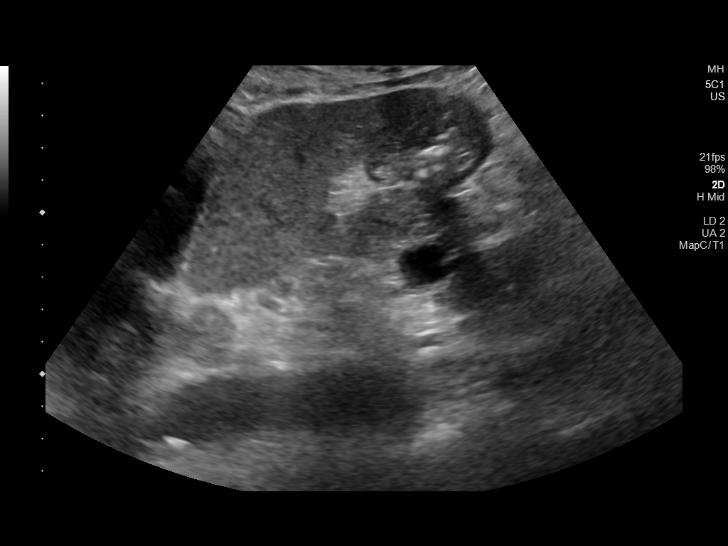
[im 3/25]
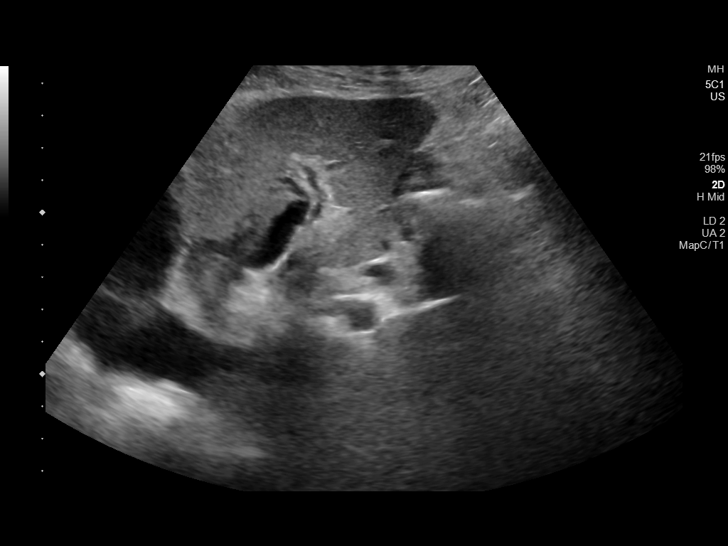
[im 5/25]
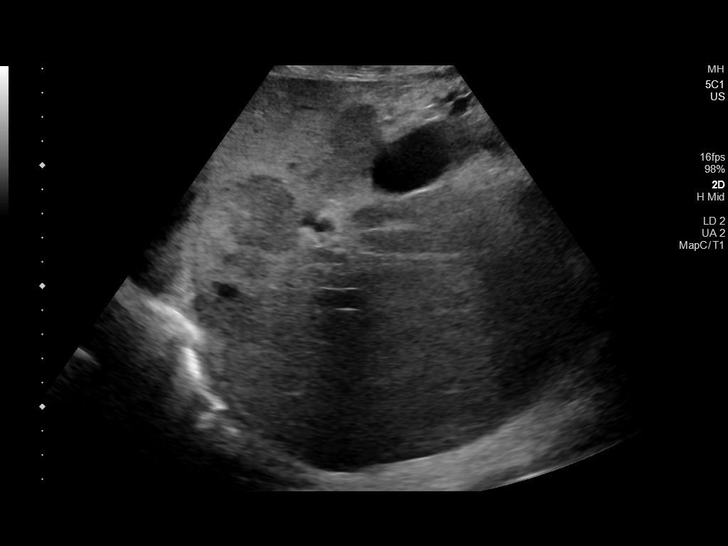
[im 7/25]
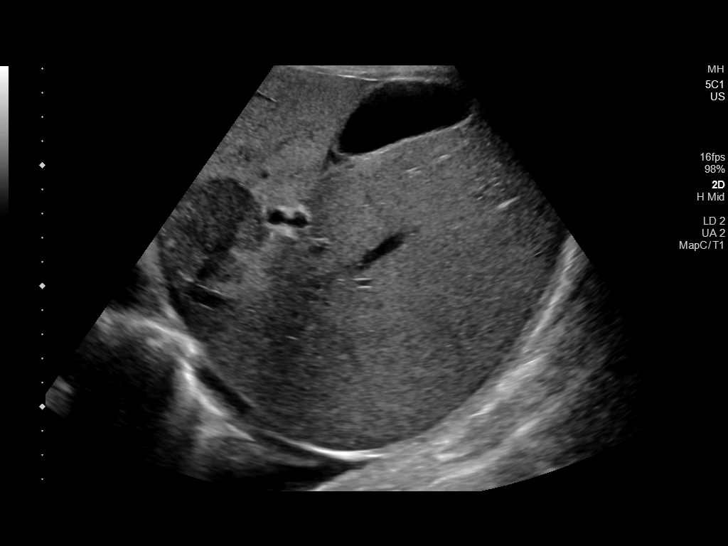
[im 9/25]
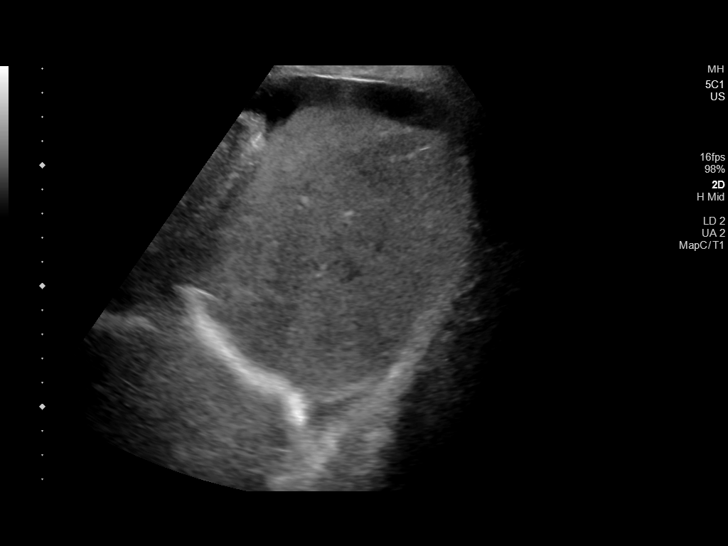
[im 11/25]
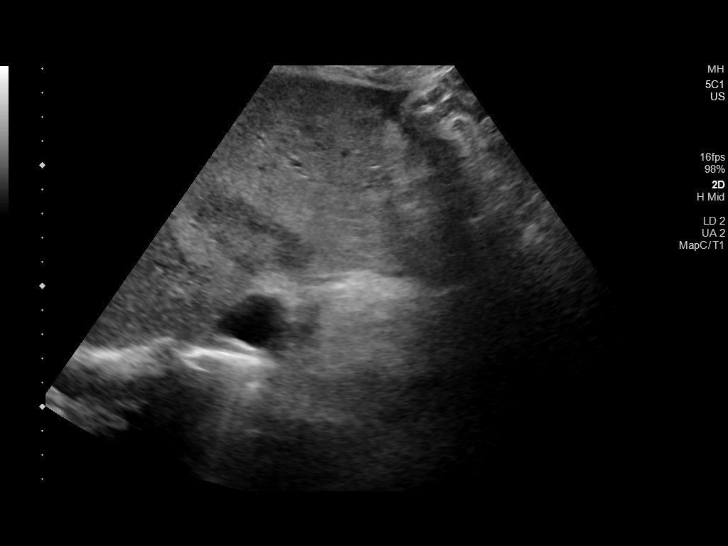
[im 13/25]
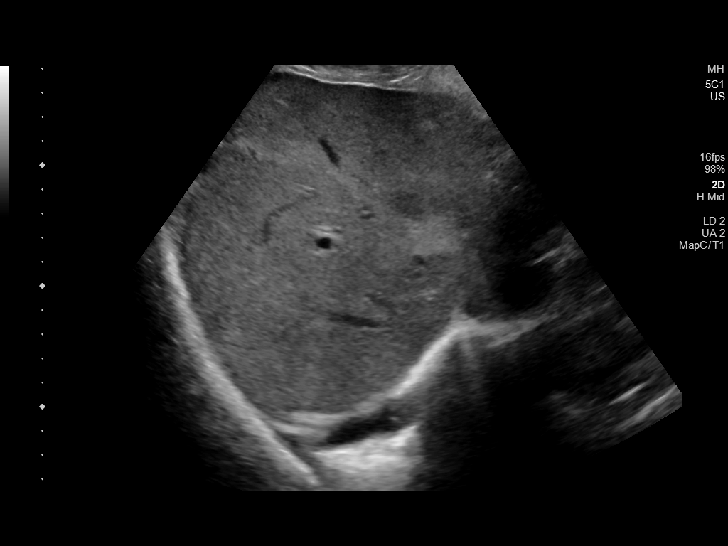
[im 15/25]
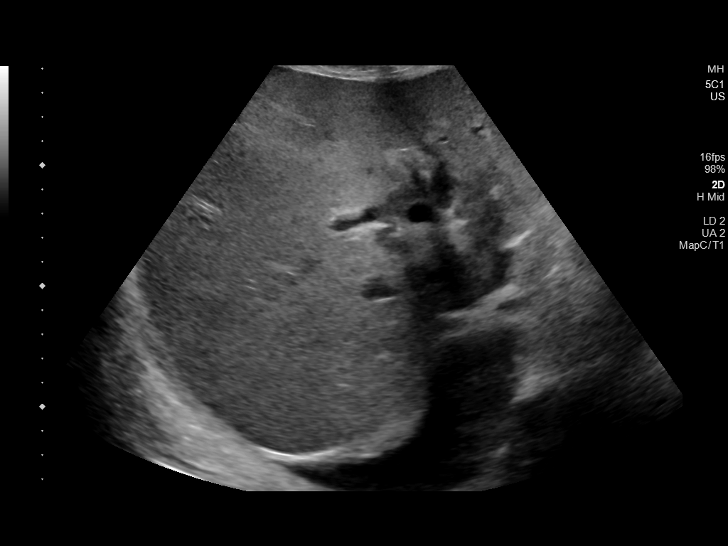
[im 17/25]
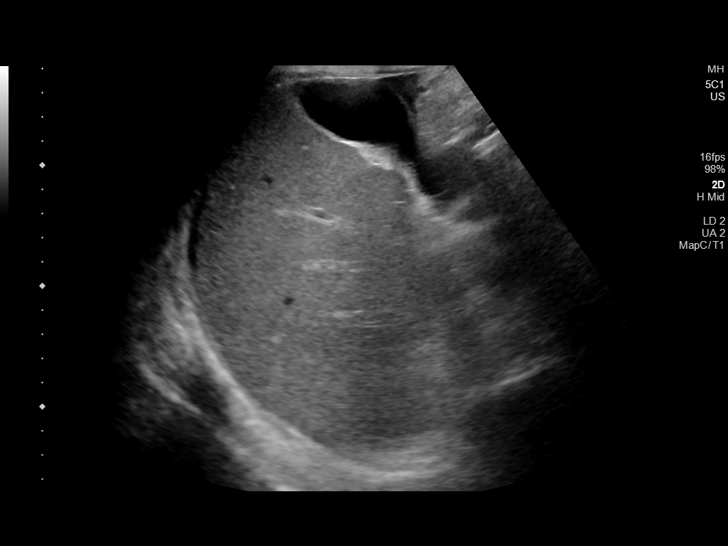
[im 19/25]
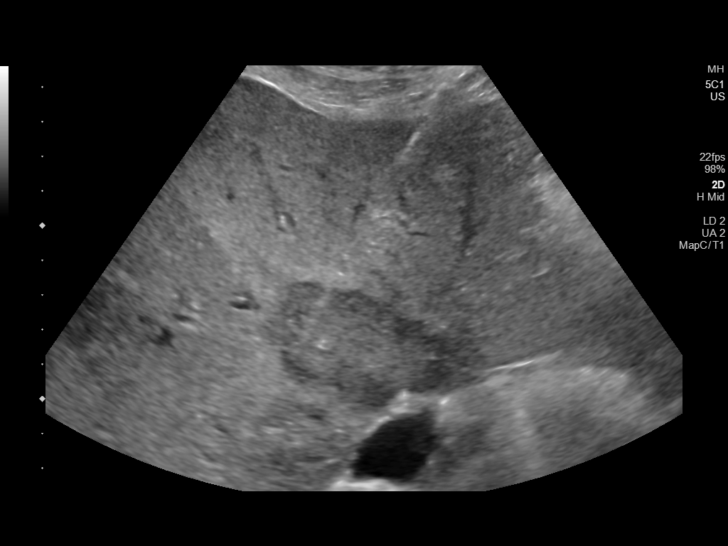
[im 21/25]
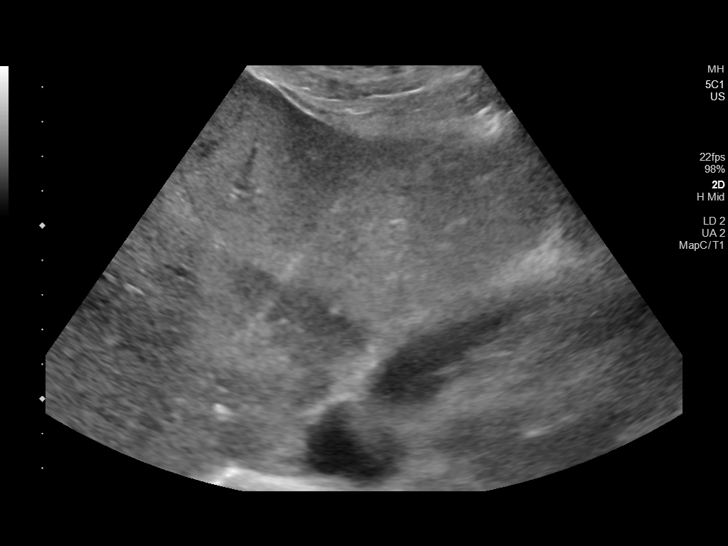
[im 23/25]
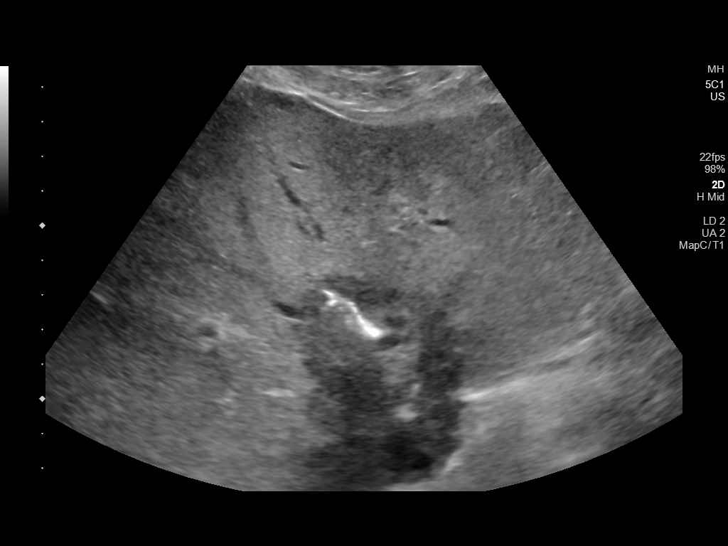
[im 25/25]
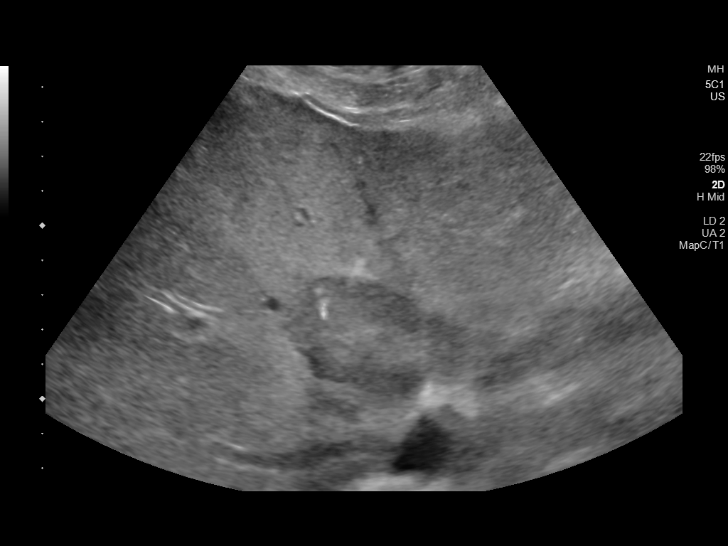

[13 of 25 positions shown; findings below may reference images not displayed]

EXAM:
ULTRASOUND GUIDED CORE BIOPSY OF CENTRAL HEPATIC MASS

MEDICATIONS:
1% LIDOCAINE LOCAL

ANESTHESIA/SEDATION:
Versed 1.5mg IV; Fentanyl 62mcg IV;

Moderate Sedation Time:  12 MINUTES

The patient was continuously monitored during the procedure by the
interventional radiology nurse under my direct supervision.

FLUOROSCOPY TIME:  Fluoroscopy Time: NONE.

COMPLICATIONS:
None immediate.

PROCEDURE:
The procedure, risks, benefits, and alternatives were explained to
the patient. Questions regarding the procedure were encouraged and
answered. The patient understands and consents to the procedure.

Previous imaging reviewed. Preliminary ultrasound performed through
the subxiphoid area. Central hypoechoic liver mass was localized and
marked for an anterior midline approach.

Under sterile conditions and local anesthesia, a 17 gauge 11.8 cm
access needle was advanced from a subxiphoid anterior oblique
approach to the central hypoechoic lesion. Needle position confirmed
with ultrasound. Images obtained for documentation. 2 18 gauge core
biopsies obtained. Samples were intact and non fragmented. These
were placed in formalin. Needle tract occluded with Gel-Foam.
Patient tolerated the procedure well. No immediate complication.
FINDINGS: Ultrasound imaging confirms needle placement in the central
hypoechoic liver mass for core biopsy.
IMPRESSION: Successful ultrasound central liver mass 18 gauge core biopsies

## 2021-10-16 IMAGING — XA IR CATHETER TUBE CHANGE
1 series · 2 of 2 positions shown · non-contrast
Comparison: Suprapubic catheter placement 08/22/2019

INDICATION: 74-year-old male with chronic urinary retention and an indwelling
percutaneous suprapubic catheter. Patient presents for exchange and
up size today.

EXAM:
IR CATHETER TUBE CHANGE

[Series 300: ir catheter tube change · 2 of 2 slices shown]
[im 1/2]
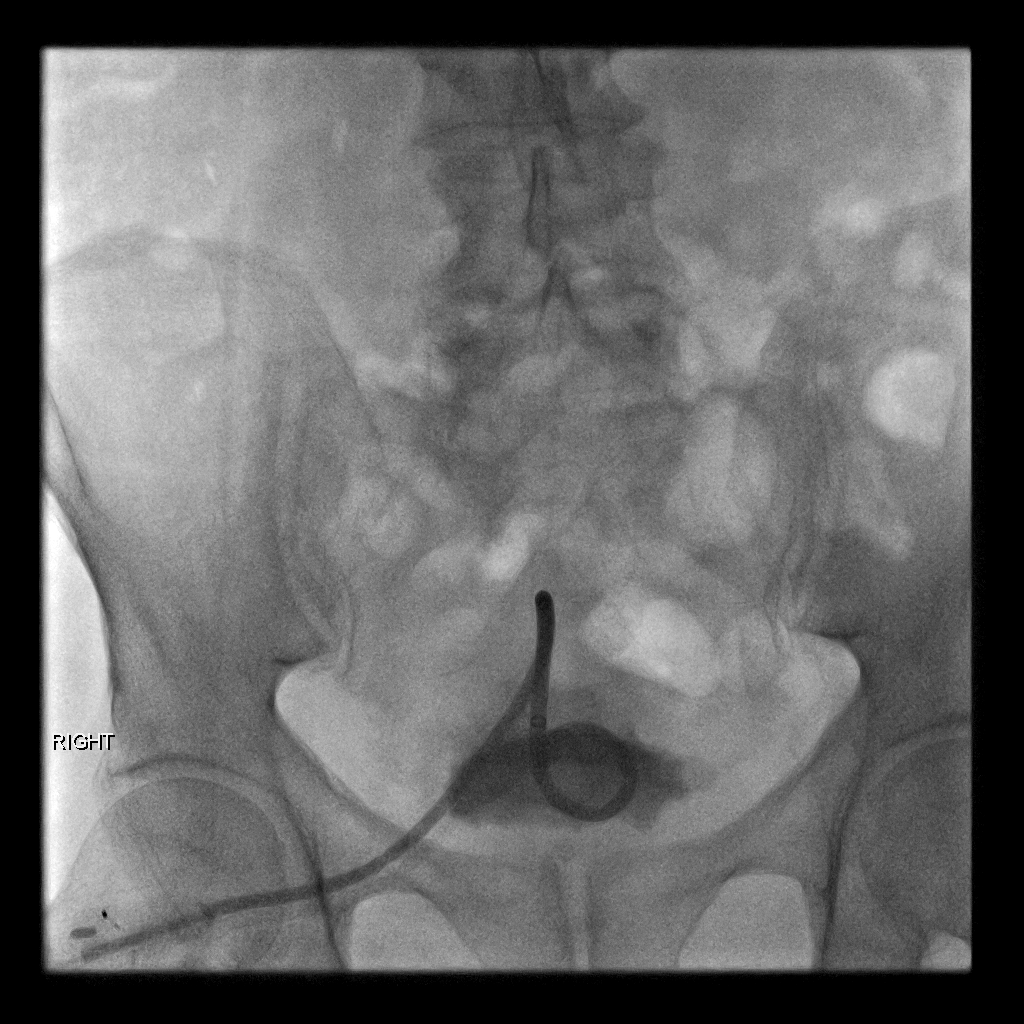
[im 2/2]
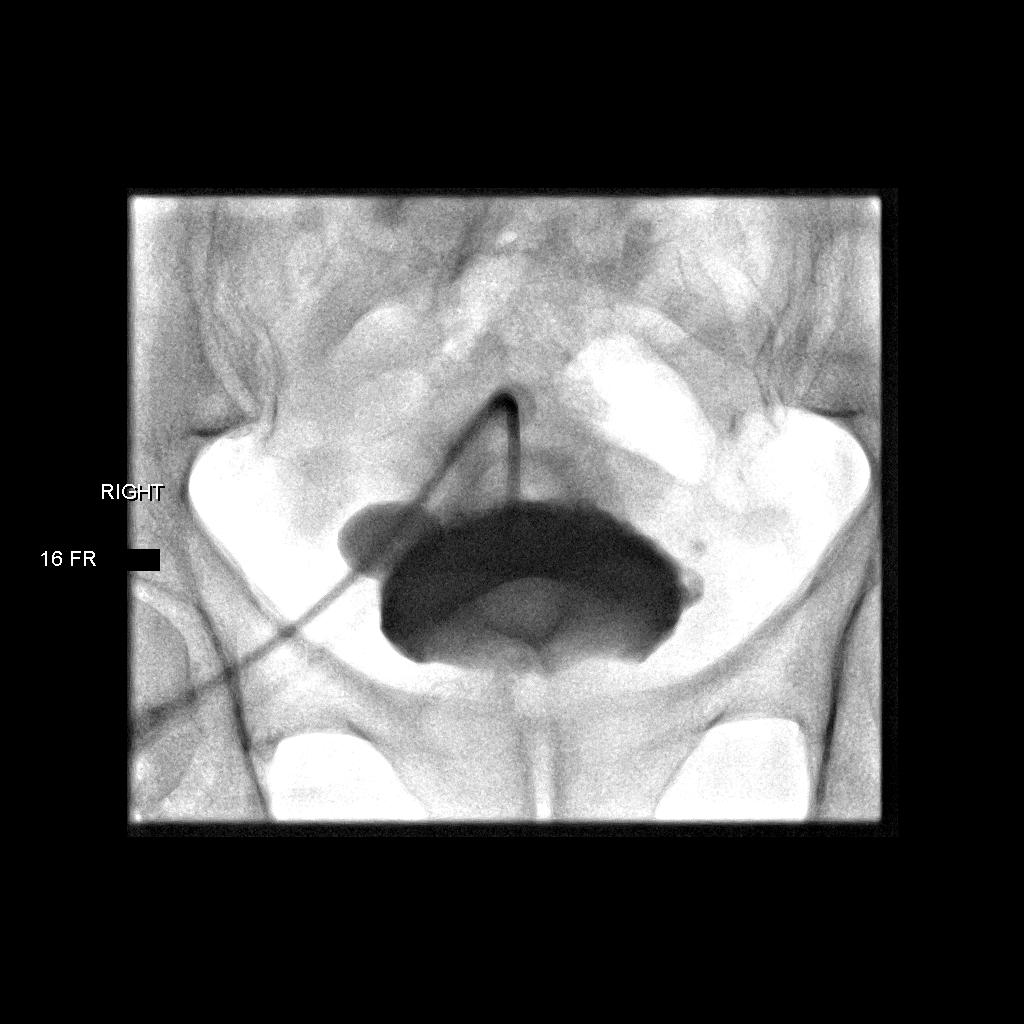

[2 of 2 positions shown; findings below may reference images not displayed]

MEDICATIONS:
None

ANESTHESIA/SEDATION:
Fentanyl 25 mcg IV; Versed 1 mg IV

Moderate Sedation Time:  6 minutes

The patient was continuously monitored during the procedure by the
interventional radiology nurse under my direct supervision.

CONTRAST:  15mL OMNIPAQUE IOHEXOL 300 MG/ML SOLN - administered into
the collecting system(s)

FLUOROSCOPY TIME:  Fluoroscopy Time:  minutes  seconds ( mGy).

COMPLICATIONS:
None immediate.

PROCEDURE:
Informed written consent was obtained from the patient after a
thorough discussion of the procedural risks, benefits and
alternatives. All questions were addressed. Maximal Sterile Barrier
Technique was utilized including caps, mask, sterile gowns, sterile
gloves, sterile drape, hand hygiene and skin antiseptic. A timeout
was performed prior to the initiation of the procedure.

A gentle hand injection of contrast material reveals that the
existing 12 French all-purpose drainage catheter is well positioned
in the bladder. The tube was cut and removed over an Amplatz wire.
The skin tract was then serially dilated to 16 French. An Entuit 16
French balloon retention percutaneous gastrostomy tube was then
advanced over the wire and into the bladder. The retention balloon
was inflated with 7 mL saline and pulled snug against the anterior
abdominal wall. The external bumper was fixed in place. Contrast
injection through the tube confirms its location remains within the
urinary bladder. The patient tolerated the procedure well. There was
no immediate complication.
IMPRESSION: Successful exchange and up size to a 16 French balloon retention
Entuit percutaneous gastrostomy tube which is now within the
bladder.

## 2021-11-01 IMAGING — US US BIOPSY CORE LIVER
1 series · 13 of 25 positions shown · non-contrast
Comparison: none

INDICATION: FIDUCIAL MARKER PLACEMENT FOR SBRT TO THE KNOWN CENTRAL HEPATIC
CHOLANGIOCARCINOMA

[Series 1: us biopsy core liver · 13 of 31 slices shown]
[im 1/31]
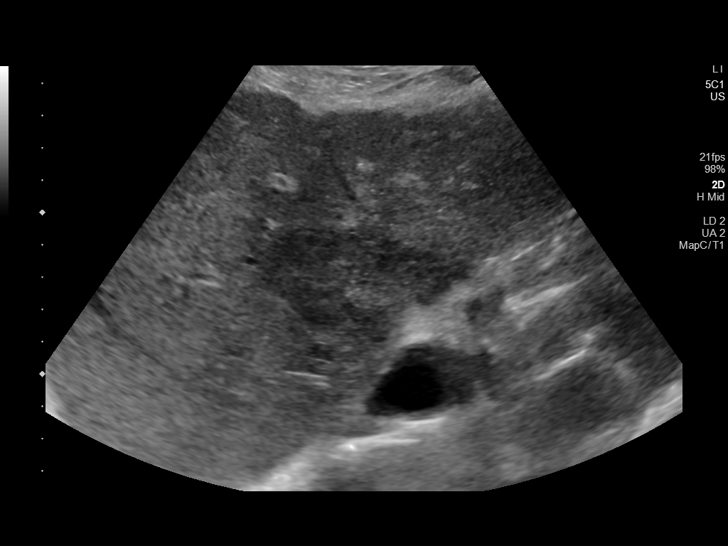
[im 3/31]
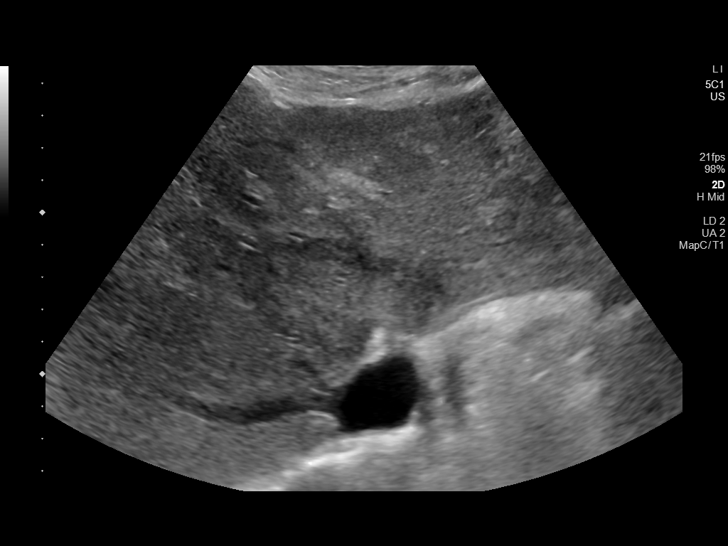
[im 6/31]
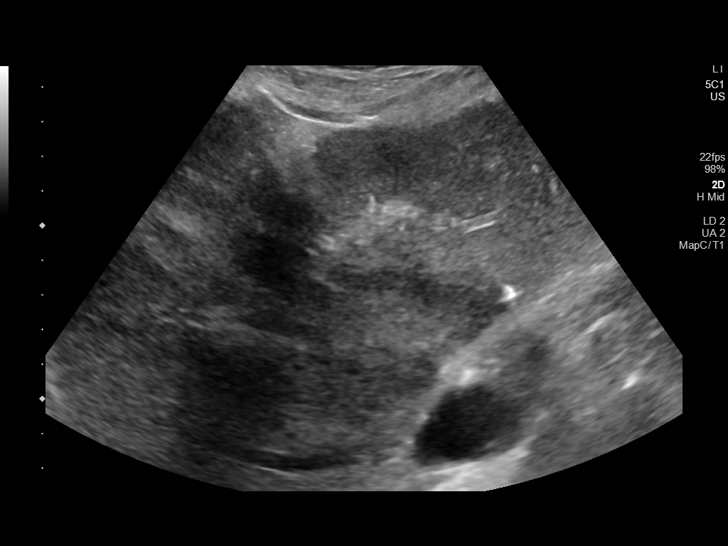
[im 8/31]
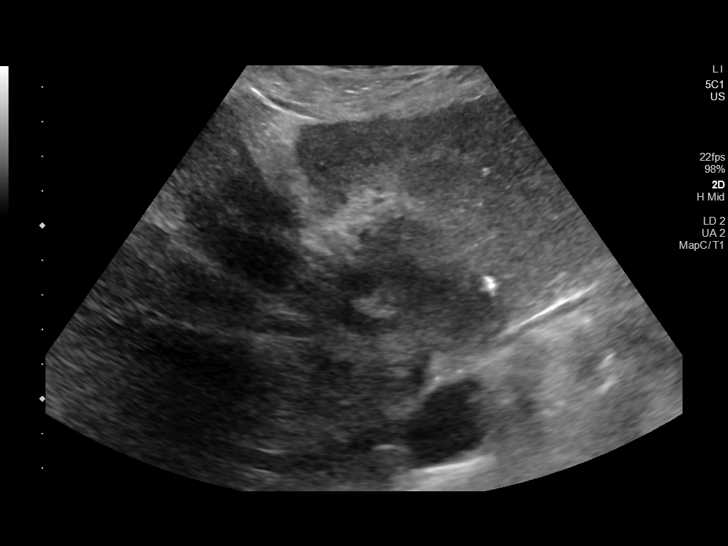
[im 11/31]
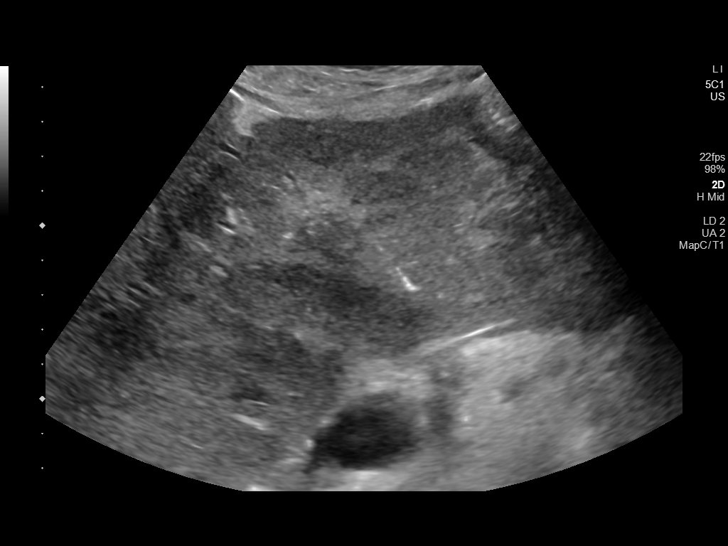
[im 13/31]
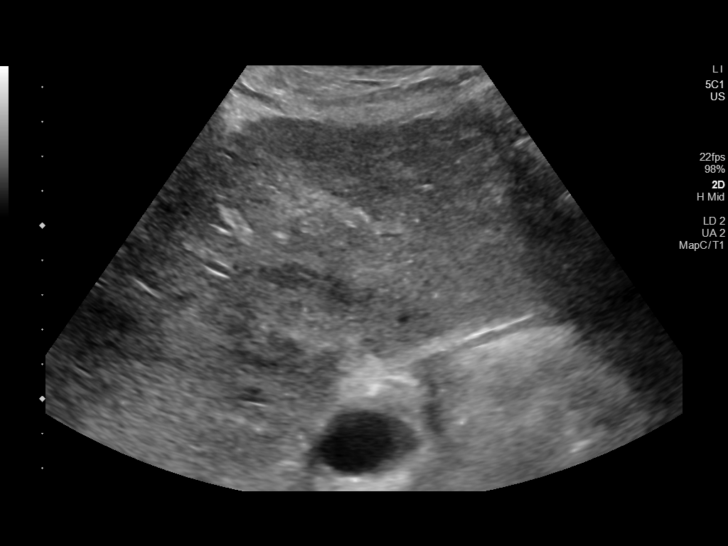
[im 16/31]
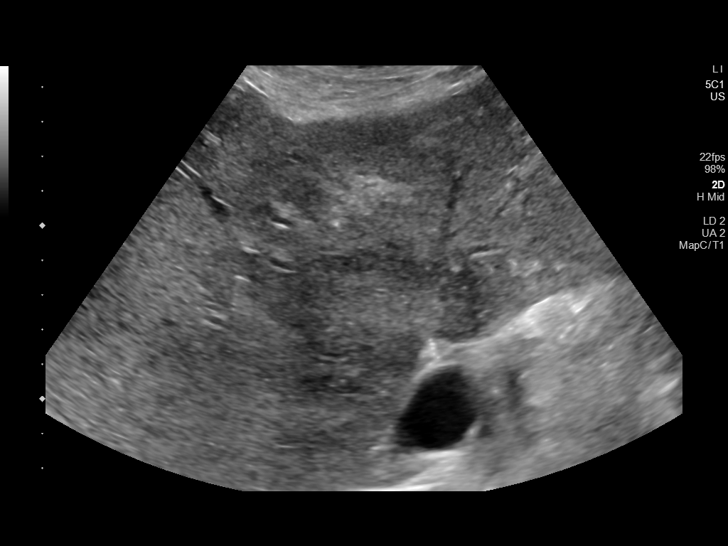
[im 18/31]
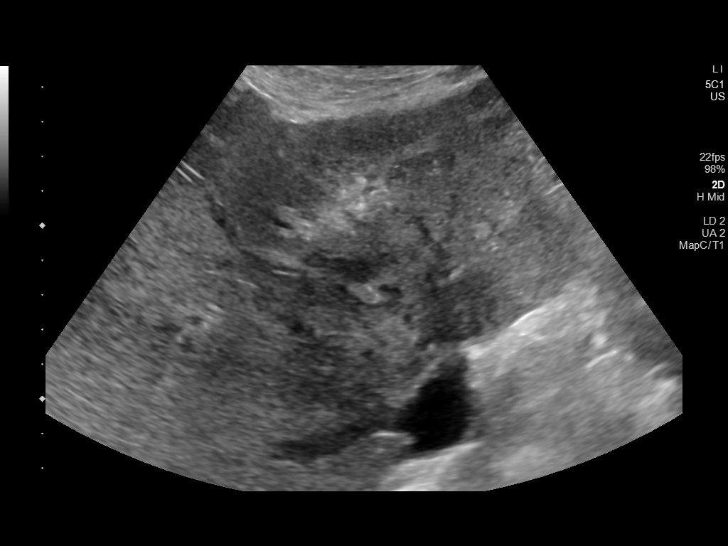
[im 21/31]
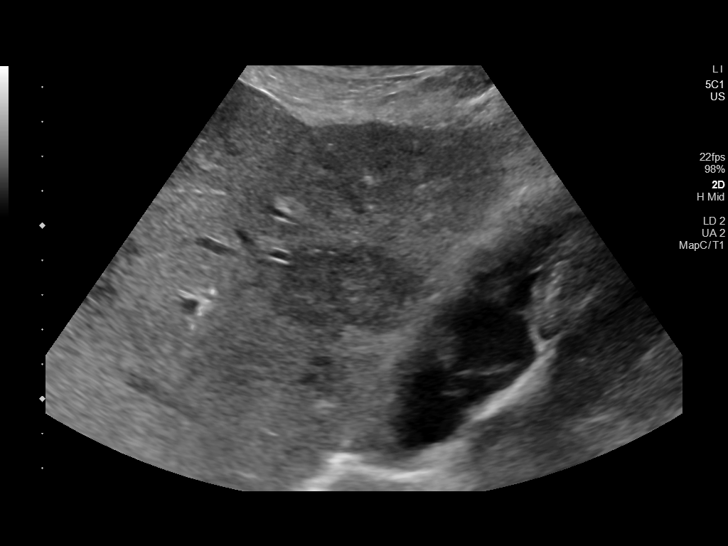
[im 23/31]
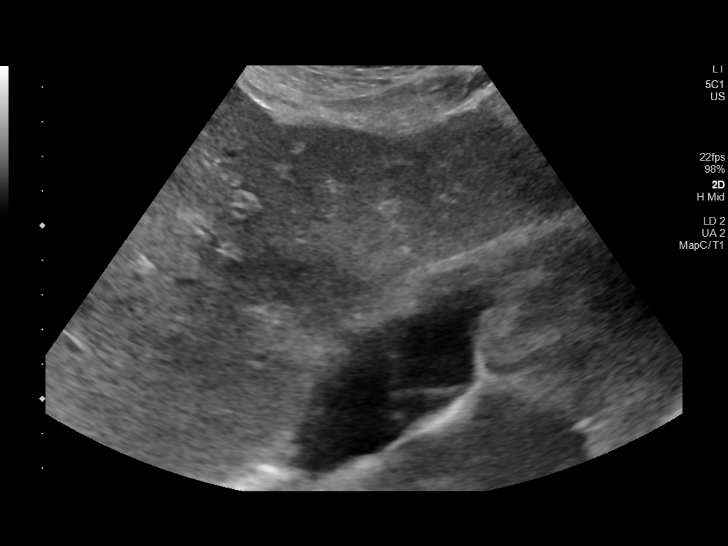
[im 26/31]
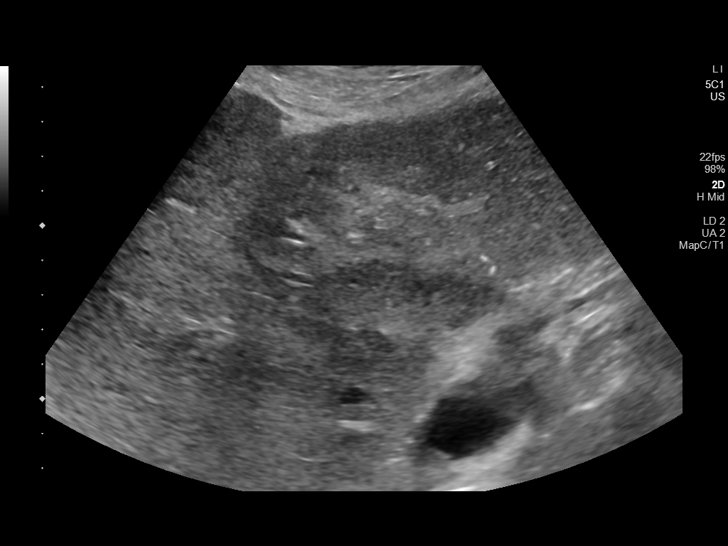
[im 28/31]
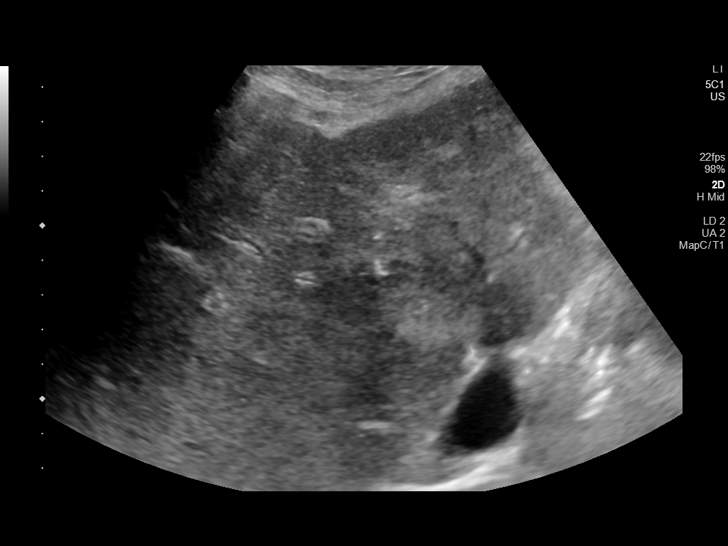
[im 31/31]
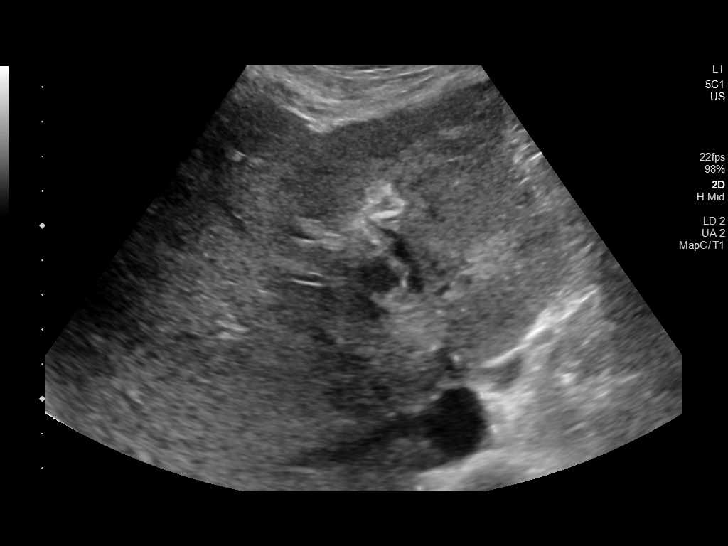

[13 of 25 positions shown; findings below may reference images not displayed]

EXAM:
ULTRASOUND GUIDED FIDUCIAL MARKER PLACEMENT FOR THE KNOWN CENTRAL
HEPATIC CHOLANGIOCARCINOMA

MEDICATIONS:
1% LIDOCAINE LOCAL

ANESTHESIA/SEDATION:
Fentanyl 100 mcg IV

Moderate Sedation Time:  14 MINUTES

The patient was continuously monitored during the procedure by the
interventional radiology nurse under my direct supervision.

PROCEDURE:
The procedure, risks, benefits, and alternatives were explained to
the patient. Questions regarding the procedure were encouraged and
answered. The patient understands and consents to the procedure.

Previous imaging reviewed. Preliminary ultrasound performed. A
subxiphoid window was localized and marked.

Under sterile conditions and local anesthesia, ultrasound guidance
was utilized to advance 3 Visicoil fiducial marker introducer
needles to the left margin, right margin, and anterior surface of
the central hypoechoic mass compatible with a biopsy-proven
cholangiocarcinoma. The fiducial markers were deployed. Images
obtained for documentation. For a posterior landmark, the lesion
does appear to extend to the hepatic IVC.

COMPLICATIONS:
None immediate.
FINDINGS: Ultrasound imaging confirms 3 fiducial markers surrounding the
central hepatic cholangiocarcinoma.
IMPRESSION: Successful ultrasound placement of 3 fiducial markers surrounding
the hepatic lesion by ultrasound. Lesion is a biopsy-proven
cholangiocarcinoma.

## 2022-03-25 ENCOUNTER — Encounter (HOSPITAL_COMMUNITY): Payer: Self-pay

## 2022-03-25 ENCOUNTER — Emergency Department (HOSPITAL_COMMUNITY): Payer: Medicare Other

## 2022-03-25 ENCOUNTER — Other Ambulatory Visit: Payer: Self-pay

## 2022-03-25 ENCOUNTER — Inpatient Hospital Stay (HOSPITAL_COMMUNITY)
Admission: EM | Admit: 2022-03-25 | Discharge: 2022-03-29 | DRG: 194 | Disposition: A | Discharge status: Home Health | Payer: Medicare Other | Attending: Internal Medicine | Admitting: Internal Medicine

## 2022-03-25 DIAGNOSIS — Z923 Personal history of irradiation: Secondary | ICD-10-CM

## 2022-03-25 DIAGNOSIS — R4189 Other symptoms and signs involving cognitive functions and awareness: Secondary | ICD-10-CM | POA: Diagnosis present

## 2022-03-25 DIAGNOSIS — N1330 Unspecified hydronephrosis: Secondary | ICD-10-CM | POA: Insufficient documentation

## 2022-03-25 DIAGNOSIS — N136 Pyonephrosis: Secondary | ICD-10-CM | POA: Diagnosis present

## 2022-03-25 DIAGNOSIS — B9620 Unspecified Escherichia coli [E. coli] as the cause of diseases classified elsewhere: Secondary | ICD-10-CM | POA: Diagnosis present

## 2022-03-25 DIAGNOSIS — Z1152 Encounter for screening for COVID-19: Secondary | ICD-10-CM

## 2022-03-25 DIAGNOSIS — N319 Neuromuscular dysfunction of bladder, unspecified: Secondary | ICD-10-CM | POA: Insufficient documentation

## 2022-03-25 DIAGNOSIS — E876 Hypokalemia: Secondary | ICD-10-CM | POA: Diagnosis not present

## 2022-03-25 DIAGNOSIS — Z79899 Other long term (current) drug therapy: Secondary | ICD-10-CM

## 2022-03-25 DIAGNOSIS — F17210 Nicotine dependence, cigarettes, uncomplicated: Secondary | ICD-10-CM | POA: Diagnosis present

## 2022-03-25 DIAGNOSIS — Z8505 Personal history of malignant neoplasm of liver: Secondary | ICD-10-CM

## 2022-03-25 DIAGNOSIS — K5900 Constipation, unspecified: Secondary | ICD-10-CM | POA: Diagnosis present

## 2022-03-25 DIAGNOSIS — J189 Pneumonia, unspecified organism: Principal | ICD-10-CM | POA: Diagnosis present

## 2022-03-25 DIAGNOSIS — F17200 Nicotine dependence, unspecified, uncomplicated: Secondary | ICD-10-CM

## 2022-03-25 DIAGNOSIS — C221 Intrahepatic bile duct carcinoma: Secondary | ICD-10-CM | POA: Diagnosis present

## 2022-03-25 DIAGNOSIS — G8929 Other chronic pain: Secondary | ICD-10-CM | POA: Diagnosis present

## 2022-03-25 DIAGNOSIS — R0602 Shortness of breath: Secondary | ICD-10-CM | POA: Diagnosis present

## 2022-03-25 DIAGNOSIS — N323 Diverticulum of bladder: Secondary | ICD-10-CM | POA: Diagnosis present

## 2022-03-25 DIAGNOSIS — G40909 Epilepsy, unspecified, not intractable, without status epilepticus: Secondary | ICD-10-CM | POA: Diagnosis present

## 2022-03-25 DIAGNOSIS — N401 Enlarged prostate with lower urinary tract symptoms: Secondary | ICD-10-CM | POA: Diagnosis present

## 2022-03-25 DIAGNOSIS — F411 Generalized anxiety disorder: Secondary | ICD-10-CM | POA: Diagnosis present

## 2022-03-25 DIAGNOSIS — R338 Other retention of urine: Secondary | ICD-10-CM | POA: Diagnosis present

## 2022-03-25 HISTORY — DX: Alcohol abuse, uncomplicated: F10.10

## 2022-03-25 HISTORY — DX: Malignant (primary) neoplasm, unspecified: C80.1

## 2022-03-25 HISTORY — DX: Benign prostatic hyperplasia without lower urinary tract symptoms: N40.0

## 2022-03-25 LAB — COMPREHENSIVE METABOLIC PANEL
ALT: 26 U/L (ref 0–44)
AST: 36 U/L (ref 15–41)
Albumin: 4 g/dL (ref 3.5–5.0)
Alkaline Phosphatase: 57 U/L (ref 38–126)
Anion gap: 11 (ref 5–15)
BUN: 17 mg/dL (ref 8–23)
CO2: 23 mmol/L (ref 22–32)
Calcium: 10 mg/dL (ref 8.9–10.3)
Chloride: 103 mmol/L (ref 98–111)
Creatinine, Ser: 1.08 mg/dL (ref 0.61–1.24)
GFR, Estimated: >60 mL/min (ref >60)
Glucose, Bld: 148 mg/dL — ABNORMAL HIGH (ref 70–99)
Potassium: 5 mmol/L (ref 3.5–5.1)
Sodium: 137 mmol/L (ref 135–145)
Total Bilirubin: 1.1 mg/dL (ref 0.3–1.2)
Total Protein: 7.5 g/dL (ref 6.5–8.1)

## 2022-03-25 LAB — CBC WITH DIFFERENTIAL/PLATELET
Abs Immature Granulocytes: 0.13 10*3/uL — ABNORMAL HIGH (ref 0.00–0.07)
Basophils Absolute: 0.1 10*3/uL (ref 0.0–0.1)
Basophils Relative: 0 %
Eosinophils Absolute: 0 10*3/uL (ref 0.0–0.5)
Eosinophils Relative: 0 %
HCT: 47.1 % (ref 39.0–52.0)
Hemoglobin: 16 g/dL (ref 13.0–17.0)
Immature Granulocytes: 1 %
Lymphocytes Relative: 4 %
Lymphs Abs: 0.9 10*3/uL (ref 0.7–4.0)
MCH: 31.9 pg (ref 26.0–34.0)
MCHC: 34 g/dL (ref 30.0–36.0)
MCV: 93.8 fL (ref 80.0–100.0)
Monocytes Absolute: 1.7 10*3/uL — ABNORMAL HIGH (ref 0.1–1.0)
Monocytes Relative: 7 %
Neutro Abs: 19.9 10*3/uL — ABNORMAL HIGH (ref 1.7–7.7)
Neutrophils Relative %: 88 %
Platelets: 356 10*3/uL (ref 150–400)
RBC: 5.02 MIL/uL (ref 4.22–5.81)
RDW: 13.8 % (ref 11.5–15.5)
WBC: 22.7 10*3/uL — ABNORMAL HIGH (ref 4.0–10.5)
nRBC: 0 % (ref 0.0–0.2)

## 2022-03-25 LAB — LACTIC ACID, PLASMA: Lactic Acid, Venous: 1.3 mmol/L (ref 0.5–1.9)

## 2022-03-25 LAB — URINALYSIS, ROUTINE W REFLEX MICROSCOPIC
Bilirubin Urine: NEGATIVE
Glucose, UA: NEGATIVE mg/dL
Ketones, ur: NEGATIVE mg/dL
Nitrite: NEGATIVE
Protein, ur: 100 mg/dL — AB
Specific Gravity, Urine: 1.014 (ref 1.005–1.030)
WBC, UA: >50 WBC/hpf — ABNORMAL HIGH (ref 0–5)
pH: 6 (ref 5.0–8.0)

## 2022-03-25 LAB — RESP PANEL BY RT-PCR (RSV, FLU A&B, COVID)  RVPGX2
Influenza A by PCR: NEGATIVE
Influenza B by PCR: NEGATIVE
Resp Syncytial Virus by PCR: NEGATIVE
SARS Coronavirus 2 by RT PCR: NEGATIVE

## 2022-03-25 LAB — STREP PNEUMONIAE URINARY ANTIGEN: Strep Pneumo Urinary Antigen: NEGATIVE

## 2022-03-25 LAB — LIPASE, BLOOD: Lipase: 35 U/L (ref 11–51)

## 2022-03-25 MED ORDER — SODIUM CHLORIDE 0.9 % IV SOLN: INTRAVENOUS | Status: DC

## 2022-03-25 MED ORDER — MORPHINE SULFATE (PF) 4 MG/ML IV SOLN
4.0000 mg | Freq: Once | INTRAVENOUS | Status: AC
  Administered 2022-03-25: 4 mg via INTRAVENOUS
  Filled 2022-03-25: qty 1

## 2022-03-25 MED ORDER — ONDANSETRON HCL 4 MG PO TABS: 4.0000 mg | ORAL_TABLET | Freq: Four times a day (QID) | ORAL | Status: DC | PRN

## 2022-03-25 MED ORDER — IOHEXOL 300 MG/ML  SOLN
100.0000 mL | Freq: Once | INTRAMUSCULAR | Status: AC | PRN
  Administered 2022-03-25: 100 mL via INTRAVENOUS

## 2022-03-25 MED ORDER — LIDOCAINE HCL URETHRAL/MUCOSAL 2 % EX GEL
1.0000 | Freq: Once | CUTANEOUS | Status: AC
  Administered 2022-03-25: 1 via URETHRAL
  Filled 2022-03-25: qty 11

## 2022-03-25 MED ORDER — ACETAMINOPHEN 325 MG PO TABS
650.0000 mg | ORAL_TABLET | Freq: Four times a day (QID) | ORAL | Status: DC | PRN
  Administered 2022-03-26: 650 mg via ORAL
  Filled 2022-03-25: qty 2

## 2022-03-25 MED ORDER — SODIUM CHLORIDE 0.9 % IV SOLN
500.0000 mg | Freq: Once | INTRAVENOUS | Status: AC
  Administered 2022-03-25: 500 mg via INTRAVENOUS
  Filled 2022-03-25: qty 5

## 2022-03-25 MED ORDER — SODIUM CHLORIDE 0.9 % IV SOLN
2.0000 g | Freq: Once | INTRAVENOUS | Status: AC
  Administered 2022-03-25: 2 g via INTRAVENOUS
  Filled 2022-03-25: qty 20

## 2022-03-25 MED ORDER — ONDANSETRON HCL 4 MG/2ML IJ SOLN: 4.0000 mg | Freq: Four times a day (QID) | INTRAMUSCULAR | Status: DC | PRN

## 2022-03-25 MED ORDER — HYDROMORPHONE HCL 1 MG/ML IJ SOLN
1.0000 mg | Freq: Once | INTRAMUSCULAR | Status: AC
  Administered 2022-03-25: 1 mg via INTRAVENOUS
  Filled 2022-03-25: qty 1

## 2022-03-25 MED ORDER — OXYCODONE-ACETAMINOPHEN 5-325 MG PO TABS
1.0000 | ORAL_TABLET | Freq: Once | ORAL | Status: AC
  Administered 2022-03-25: 1 via ORAL
  Filled 2022-03-25: qty 1

## 2022-03-25 MED ORDER — SODIUM CHLORIDE 0.9 % IV SOLN
500.0000 mg | INTRAVENOUS | Status: DC
  Filled 2022-03-25: qty 5

## 2022-03-25 MED ORDER — SODIUM CHLORIDE (PF) 0.9 % IJ SOLN
INTRAMUSCULAR | Status: AC
  Filled 2022-03-25: qty 50

## 2022-03-25 MED ORDER — SODIUM CHLORIDE 0.9 % IV SOLN
2.0000 g | INTRAVENOUS | Status: DC
  Administered 2022-03-26: 2 g via INTRAVENOUS
  Filled 2022-03-25: qty 20

## 2022-03-25 MED ORDER — ACETAMINOPHEN 650 MG RE SUPP: 650.0000 mg | Freq: Four times a day (QID) | RECTAL | Status: DC | PRN

## 2022-03-25 MED ORDER — SODIUM CHLORIDE 0.9 % IV BOLUS
1000.0000 mL | Freq: Once | INTRAVENOUS | Status: AC
  Administered 2022-03-25: 1000 mL via INTRAVENOUS

## 2022-03-25 MED ORDER — ENOXAPARIN SODIUM 40 MG/0.4ML IJ SOSY
40.0000 mg | PREFILLED_SYRINGE | INTRAMUSCULAR | Status: DC
  Administered 2022-03-25 – 2022-03-28 (×4): 40 mg via SUBCUTANEOUS
  Filled 2022-03-25 (×4): qty 0.4

## 2022-03-25 MED ORDER — HYDROCODONE-ACETAMINOPHEN 5-325 MG PO TABS
1.0000 | ORAL_TABLET | Freq: Four times a day (QID) | ORAL | Status: DC | PRN
  Administered 2022-03-26 – 2022-03-27 (×3): 1 via ORAL
  Filled 2022-03-25 (×3): qty 1

## 2022-03-25 NOTE — ED Provider Notes (Signed)
[facility] DEPT Provider Note   CSN: [id number] Arrival date & time: 03/25/22  6629     History  Chief Complaint  Patient presents with   Urinary Retention   Abdominal Pain    Mason Blackburn is a 77 y.o. male.  77 yo M with a cc of right flank pain.  Going on for a couple days but worsening quite a bit today.  Has had a little bit of cough but has a chronic cough at baseline not drastically different per the family.  No fevers.  He feels like he has been urinating normally.  Has chronic abdominal pain that is not significantly changed.  Denies trauma.   Abdominal Pain      Home Medications Prior to Admission medications   Medication Sig Start Date End Date Taking? Authorizing Provider  OXcarbazepine (TRILEPTAL) 300 MG/5ML suspension Take 5-10 mLs (300-600 mg total) by mouth 2 (two) times daily for 5 days. Take 300 mg (5 ml) in the morning and Take 600 mg (10 ml) in the evening 08/21/19 10/19/19  Nita Sells, MD  tamsulosin (FLOMAX) 0.4 MG CAPS capsule Take 0.4 mg by mouth every evening.  07/17/19   [provider]      Allergies    Patient has no known allergies.    Review of Systems   Review of Systems  Gastrointestinal:  Positive for abdominal pain.    Physical Exam Updated Vital Signs BP (!) 128/96   Pulse (!) 102   Temp 98.8 F (37.1 C)   Resp 20   Ht '5\' 8"'$  (1.727 m)   Wt 61.2 kg   SpO2 94%   BMI 20.53 kg/m  Physical Exam Vitals and nursing note reviewed.  Constitutional:      Appearance: He is well-developed.  HENT:     Head: Normocephalic and atraumatic.  Eyes:     Pupils: Pupils are equal, round, and reactive to light.  Neck:     Vascular: No JVD.  Cardiovascular:     Rate and Rhythm: Normal rate and regular rhythm.     Heart sounds: No murmur heard.    No friction rub. No gallop.  Pulmonary:     Effort: No respiratory distress.     Breath sounds: No wheezing.  Abdominal:     General: There is no  distension.     Tenderness: There is no abdominal tenderness. There is no guarding or rebound.     Comments: Diffuse abdominal discomfort  Musculoskeletal:        General: Normal range of motion.     Cervical back: Normal range of motion and neck supple.  Skin:    Coloration: Skin is not pale.     Findings: No rash.  Neurological:     Mental Status: He is alert and oriented to person, place, and time.  Psychiatric:        Behavior: Behavior normal.     ED Results / Procedures / Treatments   Labs (all labs ordered are listed, but only abnormal results are displayed) Labs Reviewed  CBC WITH DIFFERENTIAL/PLATELET - Abnormal; Notable for the following components:      Result Value   WBC 22.7 (*)    Neutro Abs 19.9 (*)    Monocytes Absolute 1.7 (*)    Abs Immature Granulocytes 0.13 (*)    All other components within normal limits  COMPREHENSIVE METABOLIC PANEL - Abnormal; Notable for the following components:   Glucose, Bld 148 (*)  All other components within normal limits  URINALYSIS, ROUTINE W REFLEX MICROSCOPIC - Abnormal; Notable for the following components:   APPearance CLOUDY (*)    Hgb urine dipstick SMALL (*)    Protein, ur 100 (*)    Leukocytes,Ua LARGE (*)    WBC, UA >50 (*)    Bacteria, UA MANY (*)    All other components within normal limits  CULTURE, BLOOD (ROUTINE X 2)  CULTURE, BLOOD (ROUTINE X 2)  RESP PANEL BY RT-PCR (RSV, FLU A&B, COVID)  RVPGX2  LIPASE, BLOOD  LACTIC ACID, PLASMA  LACTIC ACID, PLASMA    EKG None  Radiology CT ABDOMEN PELVIS W CONTRAST  Result Date: 03/25/2022 CLINICAL DATA:  RUQ pain EXAM: CT ABDOMEN AND PELVIS WITH CONTRAST TECHNIQUE: Multidetector CT imaging of the abdomen and pelvis was performed using the standard protocol following bolus administration of intravenous contrast. RADIATION DOSE REDUCTION: This exam was performed according to the departmental dose-optimization program which includes automated exposure control,  adjustment of the mA and/or kV according to patient size and/or use of iterative reconstruction technique. CONTRAST:  100 mL OMNIPAQUE IOHEXOL 300 MG/ML  SOLN COMPARISON:  04/22/2020 FINDINGS: Lower chest: Alveolar consolidation right lower lobe is consistent with pneumonia. Bibasilar linear subsegmental atelectasis or scarring. Left lower lobe nodules measuring 0.7 cm, 0.6 cm, and 0.6 cm. One of the nodules appears to be new compared to 2022. Small hiatal hernia noted. Hepatobiliary: Postop changes porta hepatis. Gallbladder is intact. No intrahepatic ductal dilatation identified. There is an area of decreased attenuation measuring 4.1 x 3.3 cm represent a stable finding compared to the prior study. MRI of the liver may be helpful for follow up of this finding. Pancreas: Unremarkable. No pancreatic ductal dilatation or surrounding inflammatory changes. Spleen: Normal in size without focal abnormality. Adrenals/Urinary Tract: No adrenal lesions. Severe bilateral hydronephrosis and hydroureter. On the left there is a UVJ stone measuring 1.3 cm. On the right the urethral orifice is within a bladder diverticulum. Urinary bladder wall thickening and irregularity noted. Inflammatory or neoplastic processes are possible and could be assessed more definitively cystoscopically. Stomach/Bowel: Stomach is within normal limits. Appendix appears normal. No evidence of bowel wall thickening, distention, or inflammatory changes. Vascular/Lymphatic: Aortic atherosclerosis. No enlarged abdominal or pelvic lymph nodes. Reproductive: Enlarged prostate noted Other: There is evidence of a urachal remnant. No abdominal wall hernia or abnormality. No abdominopelvic ascites. Musculoskeletal: Thoracolumbosacral degenerative changes. IMPRESSION: 1. Right lower lobe alveolar consolidation, likely pneumonia. 2. Small hiatal hernia. 3. Portahepatis mass, approximately 4 cm without definite interval change. 4. Severe bilateral hydronephrosis  and hydroureter with a 1.3 cm stone left UVJ and a right-sided bladder diverticulum. 5. Urinary bladder wall thickening consistent with hypertrophy or inflammation. Neoplasm cannot be excluded. 6. Enlarged prostate. Electronically Signed   By: Sammie Bench M.D.   On: 03/25/2022 14:11    Procedures Procedures    Medications Ordered in ED Medications  azithromycin (ZITHROMAX) 500 mg in sodium chloride 0.9 % 250 mL IVPB (has no administration in time range)  lidocaine (XYLOCAINE) 2 % jelly 1 Application (has no administration in time range)  HYDROmorphone (DILAUDID) injection 1 mg (has no administration in time range)  oxyCODONE-acetaminophen (PERCOCET/ROXICET) 5-325 MG per tablet 1 tablet (1 tablet Oral Given 03/25/22 1324)  sodium chloride (PF) 0.9 % injection (  Given by Other 03/25/22 1622)  iohexol (OMNIPAQUE) 300 MG/ML solution 100 mL (100 mLs Intravenous Contrast Given 03/25/22 1343)  cefTRIAXone (ROCEPHIN) 2 g in sodium chloride 0.9 % 100  mL IVPB (2 g Intravenous New Bag/Given 03/25/22 1646)  morphine (PF) 4 MG/ML injection 4 mg (4 mg Intravenous Given 03/25/22 1620)  sodium chloride 0.9 % bolus 1,000 mL (1,000 mLs Intravenous New Bag/Given 03/25/22 1620)  HYDROmorphone (DILAUDID) injection 1 mg (1 mg Intravenous Given 03/25/22 1645)    ED Course/ Medical Decision Making/ A&P                           Medical Decision Making Amount and/or Complexity of Data Reviewed Labs: ordered. Radiology: ordered.  Risk Prescription drug management. Decision regarding hospitalization.   77 yo M with a significant past medical history of cholangiocarcinoma urinary tension status post suprapubic catheter placements and removal.  Patient is here with right flank pain.  He had a CT scan ordered in the triage process it is concerning for possible right lower lobe pneumonia.  He has been coughing a little bit more ileus per him.  Not drastically different per family.  No obvious adventitious lung sounds  for me on exam.  Very uncomfortable.  He also has a left UVJ stone.  He has no left flank pain.  His renal function appears to be at baseline.  Significant leukocytosis of 22,000.  Will start on antibiotics for possible pneumonia.  Will discuss with urology.  I discussed case with Dr. Milford Cage, urology recommended placing a Foley catheter starting him on antibiotics and they would see him in the hospital setting.  Will discuss with medicine.  The patients results and plan were reviewed and discussed.   Any x-rays performed were independently reviewed by myself.   Differential diagnosis were considered with the presenting HPI.  Medications  azithromycin (ZITHROMAX) 500 mg in sodium chloride 0.9 % 250 mL IVPB (has no administration in time range)  lidocaine (XYLOCAINE) 2 % jelly 1 Application (has no administration in time range)  HYDROmorphone (DILAUDID) injection 1 mg (has no administration in time range)  oxyCODONE-acetaminophen (PERCOCET/ROXICET) 5-325 MG per tablet 1 tablet (1 tablet Oral Given 03/25/22 1324)  sodium chloride (PF) 0.9 % injection (  Given by Other 03/25/22 1622)  iohexol (OMNIPAQUE) 300 MG/ML solution 100 mL (100 mLs Intravenous Contrast Given 03/25/22 1343)  cefTRIAXone (ROCEPHIN) 2 g in sodium chloride 0.9 % 100 mL IVPB (2 g Intravenous New Bag/Given 03/25/22 1646)  morphine (PF) 4 MG/ML injection 4 mg (4 mg Intravenous Given 03/25/22 1620)  sodium chloride 0.9 % bolus 1,000 mL (1,000 mLs Intravenous New Bag/Given 03/25/22 1620)  HYDROmorphone (DILAUDID) injection 1 mg (1 mg Intravenous Given 03/25/22 1645)    Vitals:   03/25/22 1206 03/25/22 1409 03/25/22 1622 03/25/22 1630  BP: (!) 136/106 (!) 130/104 (!) 132/104 (!) 128/96  Pulse: (!) 110 (!) 108 (!) 117 (!) 102  Resp: '20 20 20 20  '$ Temp: 98.6 F (37 C) 98.8 F (37.1 C)    TempSrc: Oral     SpO2: 95% 90% 93% 94%  Weight:      Height:        Final diagnoses:  Pneumonia of right lower lobe due to infectious organism     Admission/ observation were discussed with the admitting physician, patient and/or family and they are comfortable with the plan.          Final Clinical Impression(s) / ED Diagnoses Final diagnoses:  Pneumonia of right lower lobe due to infectious organism    Rx / DC Orders ED Discharge Orders     None  Deno Etienne, DO 03/25/22 1718

## 2022-03-25 NOTE — H&P (Signed)
History and Physical    Mason Blackburn UXN:[id number] DOB: [birth year] DOA: 03/25/2022  PCP: Patient, No Pcp Per   Patient coming from: Home Chief Complaint  Patient presents with   Urinary Retention   Abdominal Pain   HPI: Mason Blackburn is a 77 y.o. 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 Burr Medico, this has been stable on last visit in February/03/2020, lung nodules, urine retention/prostatic enlargement/neurogenic bladder previously with suprapubic catheter which he removed himself and has been doing intermittent self-catheterization, seizure disorder/cognitive difficulties presented with right upper quadrant abdominal pain and having small voids x 1 week no BM in over a week.  He does have chronic abdominal pain not significantly changed.  ED Course: Mildly tachycardic up to 117 vitals stable, SpO2 stable on room air.  Labs showed leukocytosis 22.7 K, abnormal UA cloudy, WBC more than 50, LE large nitrate negative. Underwent CT abdomen pelvis "Labs: wbc 22k, bmp w/ k 5.0. UA: wbc>50, LE Large UR:KYHCW lower lobe alveolar consolidation, likely pneumonia. Portahepatis mass, approximately 4 cm without definite interval change. Severe bilateral hydronephrosis and hydroureter with a 1.3 cm stone left UVJ and a right-sided bladder diverticulum. Urinary bladder wall thickening consistent with hypertrophy or inflammation. Neoplasm cannot be excluded. Enlarged prostate" Urology was consulted advised Foley catheter placement and outpatient follow-up. Patient was appearing uncomfortable having right lower chest pain, due to concern for PE due to patient's tachycardia VQ scan ordered.  Placed on IV antibiotics and admission requested   Assessment/Plan  RLL pneumonia with leukocytosis: Continue empiric antibiotics follow-up urine antigens, blood culture, sputum culture.  Patient with mild tachycardia could be in the setting of patient's pneumonia, follow-up VQ  scan for completeness  Urine retention/prostatic enlargement/neurogenic bladder previously with suprapubic catheter which he removed himself and has been doing intermittent self-catheterization: Urology has been consulted advised Foley catheter placement.  Follow-up culture data continue empiric antibiotics as above. After foley is ins he is already feeling better.  Cholangiocarcinoma history S/P radiation followed by oncology as outpatient. CT showed "Postop changes porta hepatis. Gallbladder is intact. No intrahepatic ductal dilatation identified. There is an area of decreased attenuation measuring 4.1 x 3.3 cm represent a stable finding compared to the prior study"  Anxiety disorder: Mood is stable resume home meds Seizure disorder: Resume home meds Cognitive impairment: Supportive care  Body mass index is 20.53 kg/m.  Severity of Illness: The appropriate patient status for this patient is OBSERVATION. Observation status is judged to be reasonable and necessary in order to provide the required intensity of service to ensure the patient's safety. The patient's presenting symptoms, physical exam findings, and initial radiographic and laboratory data in the context of their medical condition is felt to place them at decreased risk for further clinical deterioration. Furthermore, it is anticipated that the patient will be medically stable for discharge from the hospital within 2 midnights of admission.    DVT prophylaxis: enoxaparin (LOVENOX) injection 40 mg Start: 03/25/22 2200 Code Status:   Code Status: Full Code  Family Communication: Admission, patients condition and plan of care including tests being ordered have been discussed with the patient and daughter in law  who indicate understanding and agree with the plan and Code Status.  Consults called:  Urology DrFeng  Review of Systems: All systems were reviewed and were negative except as mentioned in HPI above. Negative for  fever Negative for chest pain Negative for shortness of breath  Past Medical History:  Diagnosis Date  Alcohol abuse    Cancer ([place])    Enlarged prostate    Seizures [facility])     Past Surgical History:  Procedure Laterality Date   IR CATHETER TUBE CHANGE  10/03/2019     reports that he has been smoking cigarettes. He has a 100.00 pack-year smoking history. He has never used smokeless tobacco. He reports current alcohol use. He reports that he does not use drugs.  No Known Allergies  History reviewed. No pertinent family history.   Prior to Admission medications   Medication Sig Start Date End Date Taking? Authorizing Provider  OXcarbazepine (TRILEPTAL) 300 MG/5ML suspension Take 5-10 mLs (300-600 mg total) by mouth 2 (two) times daily for 5 days. Take 300 mg (5 ml) in the morning and Take 600 mg (10 ml) in the evening 08/21/19 10/19/19  Nita Sells, MD  tamsulosin (FLOMAX) 0.4 MG CAPS capsule Take 0.4 mg by mouth every evening.  07/17/19   [provider]    Physical Exam: Vitals:   03/25/22 1206 03/25/22 1409 03/25/22 1622 03/25/22 1630  BP: (!) 136/106 (!) 130/104 (!) 132/104 (!) 128/96  Pulse: (!) 110 (!) 108 (!) 117 (!) 102  Resp: '20 20 20 20  '$ Temp: 98.6 F (37 C) 98.8 F (37.1 C)    TempSrc: Oral     SpO2: 95% 90% 93% 94%  Weight:      Height:       General exam: AAOx3  NAD, weak appearing. HEENT:Oral mucosa moist, Ear/Nose WNL grossly, dentition normal. Respiratory system: bilaterally clear, tender on rt lower and rt abdomen  Cardiovascular system: S1 & S2 +, No JVD,. Gastrointestinal system: Abdomen soft, NT,ND, BS+ Nervous System:Alert, awake, moving extremities and grossly nonfocal Extremities: No edema, distal peripheral pulses palpable.  Skin: No rashes,no icterus. MSK: Normal muscle bulk,tone, power  Foley Catheter: just placed now.  Labs on Admission: I have personally reviewed following labs and imaging studies  CBC: Recent Labs  Lab  03/25/22 1104  WBC 22.7*  NEUTROABS 19.9*  HGB 16.0  HCT 47.1  MCV 93.8  PLT 025   Basic Metabolic Panel: Recent Labs  Lab 03/25/22 1104  NA 137  K 5.0  CL 103  CO2 23  GLUCOSE 148*  BUN 17  CREATININE 1.08  CALCIUM 10.0   GFR: Estimated Creatinine Clearance: 51.9 mL/min (by C-G formula based on SCr of 1.08 mg/dL). Liver Function Tests: Recent Labs  Lab 03/25/22 1104  AST 36  ALT 26  ALKPHOS 57  BILITOT 1.1  PROT 7.5  ALBUMIN 4.0   Recent Labs  Lab 03/25/22 1104  LIPASE 35  Urine analysis:    Component Value Date/Time   COLORURINE YELLOW 03/25/2022 1045   APPEARANCEUR CLOUDY (A) 03/25/2022 1045   LABSPEC 1.014 03/25/2022 1045   PHURINE 6.0 03/25/2022 1045   GLUCOSEU NEGATIVE 03/25/2022 1045   HGBUR SMALL (A) 03/25/2022 1045   BILIRUBINUR NEGATIVE 03/25/2022 [place] 03/25/2022 1045   PROTEINUR 100 (A) 03/25/2022 1045   NITRITE NEGATIVE 03/25/2022 1045   LEUKOCYTESUR LARGE (A) 03/25/2022 1045    Radiological Exams on Admission: CT ABDOMEN PELVIS W CONTRAST  Result Date: 03/25/2022 CLINICAL DATA:  RUQ pain EXAM: CT ABDOMEN AND PELVIS WITH CONTRAST TECHNIQUE: Multidetector CT imaging of the abdomen and pelvis was performed using the standard protocol following bolus administration of intravenous contrast. RADIATION DOSE REDUCTION: This exam was performed according to the departmental dose-optimization program which includes automated exposure control, adjustment of the mA and/or kV  according to patient size and/or use of iterative reconstruction technique. CONTRAST:  100 mL OMNIPAQUE IOHEXOL 300 MG/ML  SOLN COMPARISON:  04/22/2020 FINDINGS: Lower chest: Alveolar consolidation right lower lobe is consistent with pneumonia. Bibasilar linear subsegmental atelectasis or scarring. Left lower lobe nodules measuring 0.7 cm, 0.6 cm, and 0.6 cm. One of the nodules appears to be new compared to 2022. Small hiatal hernia noted. Hepatobiliary: Postop changes  porta hepatis. Gallbladder is intact. No intrahepatic ductal dilatation identified. There is an area of decreased attenuation measuring 4.1 x 3.3 cm represent a stable finding compared to the prior study. MRI of the liver may be helpful for follow up of this finding. Pancreas: Unremarkable. No pancreatic ductal dilatation or surrounding inflammatory changes. Spleen: Normal in size without focal abnormality. Adrenals/Urinary Tract: No adrenal lesions. Severe bilateral hydronephrosis and hydroureter. On the left there is a UVJ stone measuring 1.3 cm. On the right the urethral orifice is within a bladder diverticulum. Urinary bladder wall thickening and irregularity noted. Inflammatory or neoplastic processes are possible and could be assessed more definitively cystoscopically. Stomach/Bowel: Stomach is within normal limits. Appendix appears normal. No evidence of bowel wall thickening, distention, or inflammatory changes. Vascular/Lymphatic: Aortic atherosclerosis. No enlarged abdominal or pelvic lymph nodes. Reproductive: Enlarged prostate noted Other: There is evidence of a urachal remnant. No abdominal wall hernia or abnormality. No abdominopelvic ascites. Musculoskeletal: Thoracolumbosacral degenerative changes. IMPRESSION: 1. Right lower lobe alveolar consolidation, likely pneumonia. 2. Small hiatal hernia. 3. Portahepatis mass, approximately 4 cm without definite interval change. 4. Severe bilateral hydronephrosis and hydroureter with a 1.3 cm stone left UVJ and a right-sided bladder diverticulum. 5. Urinary bladder wall thickening consistent with hypertrophy or inflammation. Neoplasm cannot be excluded. 6. Enlarged prostate. Electronically Signed   By: Sammie Bench M.D.   On: 03/25/2022 14:11    Antonieta Pert MD [facility]  If 7PM-7AM, please contact night-coverage [email or web address]  03/25/2022, 5:46 PM

## 2022-03-25 NOTE — ED Triage Notes (Signed)
Patient reports that he began having RUQ pain this AM. Patient states he has been voiding small amounts x 1 week and has not had a BM in over a week.

## 2022-03-25 NOTE — ED Notes (Signed)
Unable to get second set of blood cultures.

## 2022-03-25 NOTE — ED Provider Triage Note (Signed)
Emergency Medicine Provider Triage Evaluation Note  Mason Blackburn , a 77 y.o. male  was evaluated in triage.  Pt complains of right upper quadrant abdominal pain in last 2 days.  Pain is intermittent, nonradiating, has become constant since yesterday.  Patient reports very minimal urination.  Patient reports last bowel movement was 2 days ago.  Denies blood in stool or urine.  No fever, nausea, vomiting.  Review of Systems  Positive: As above Negative: As above  Physical Exam  BP (!) 140/117 (BP Location: Left Arm)   Pulse 95   Temp 98.4 F (36.9 C) (Oral)   Resp 20   Ht '5\' 8"'$  (1.727 m)   Wt 61.2 kg   SpO2 98%   BMI 20.53 kg/m  Gen:   Awake, no distress   Resp:  Normal effort  MSK:   Moves extremities without difficulty  Other:    Medical Decision Making  Medically screening exam initiated at 10:45 AM.  Appropriate orders placed.  Mason Blackburn was informed that the remainder of the evaluation will be completed by another provider, this initial triage assessment does not replace that evaluation, and the importance of remaining in the ED until their evaluation is complete.     Mason Blackburn, [place] 03/25/22 Mason Blackburn

## 2022-03-25 NOTE — Consult Note (Signed)
Urology Consult   Physician requesting consult: Dr Lupita Leash  Reason for consult: Urinary retention possible ureteral stone  History of Present Illness: Mason Blackburn is a 77 y.o. white male who has history of probable neurogenic bladder with associated urinary retention.  Previously followed by Dr. Louis Meckel and myself at [facility].  Had been on clean intermittent catheterization but did not tolerate and eventually switch to suprapubic catheter which was subsequently discontinued.  Patient has seizure disorders and some cognitive issues which makes compliance difficult.  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  Presented today to the emergency room with right-sided lower abdominal discomfort.  During evaluation has CT scan which shows some right lower lobe consolidation likely pneumonia, severe bilateral hydronephrosis and distended urinary bladder.  The reading on CT also was a 1.3 cm left UVJ stone but review of the CT may show a Hutch diverticulum with stone with the entrance of the left ureter into the diverticulum.  Patient is having no left-sided pain.  Patient has significant leukocytosis creatinine is normal.  Urology asked to evaluate for his possible ureteral stone and retention. The catheter has been placed and greater than the 1000 cc obtained.  Urinalysis looks infected culture has been sent  He denies a history of voiding or storage urinary symptoms, hematuria, UTIs, STDs, urolithiasis, GU malignancy/trauma/surgery.  Past Medical History:  Diagnosis Date   Alcohol abuse    Cancer ([place])    Enlarged prostate    Seizures [facility])     Past Surgical History:  Procedure Laterality Date   IR CATHETER TUBE CHANGE  10/03/2019    Current Hospital Medications:  Home Meds:  No current facility-administered medications on file prior to encounter.   Current Outpatient Medications on File Prior to Encounter  Medication Sig Dispense  Refill   OXcarbazepine (TRILEPTAL) 300 MG/5ML suspension Take 5-10 mLs (300-600 mg total) by mouth 2 (two) times daily for 5 days. Take 300 mg (5 ml) in the morning and Take 600 mg (10 ml) in the evening 250 mL 12   tamsulosin (FLOMAX) 0.4 MG CAPS capsule Take 0.4 mg by mouth every evening.        Scheduled Meds:  enoxaparin (LOVENOX) injection  40 mg Subcutaneous Q24H   lidocaine  1 Application Urethral Once   Continuous Infusions:  sodium chloride     azithromycin (ZITHROMAX) 500 mg in sodium chloride 0.9 % 250 mL IVPB     [START ON 03/26/2022] azithromycin     [START ON 03/26/2022] cefTRIAXone (ROCEPHIN)  IV     PRN Meds:.acetaminophen **OR** acetaminophen, HYDROcodone-acetaminophen, ondansetron **OR** ondansetron (ZOFRAN) IV  Allergies: No Known Allergies  History reviewed. No pertinent family history.  Social History:  reports that he has been smoking cigarettes. He has a 100.00 pack-year smoking history. He has never used smokeless tobacco. He reports current alcohol use. He reports that he does not use drugs.  ROS: A complete review of systems was performed.  All systems are negative except for pertinent findings as noted.  Physical Exam:  Vital signs in last 24 hours: Temp:  [98.4 F (36.9 C)-98.8 F (37.1 C)] 98.8 F (37.1 C) (01/04 1409) Pulse Rate:  [95-117] 102 (01/04 1630) Resp:  [20] 20 (01/04 1630) BP: (128-140)/(96-117) 128/96 (01/04 1630) SpO2:  [90 %-98 %] 94 % (01/04 1630) Weight:  [61.2 kg] 61.2 kg (01/04 1024)   GI: Abdomen, soft patient has tenderness particular in the right lower quadrant without rebound tenderness.  GU: No CVA tenderness Lymphatic: No lymphadenopathy NPsychiatric: Patient sedated with narcotics  Laboratory Data:  Recent Labs    03/25/22 1104  WBC 22.7*  HGB 16.0  HCT 47.1  PLT 356    Recent Labs    03/25/22 1104  NA 137  K 5.0  CL 103  GLUCOSE 148*  BUN 17  CALCIUM 10.0  CREATININE 1.08     Results for orders  placed or performed during the hospital encounter of 03/25/22 (from the past 24 hour(s))  Urinalysis, Routine w reflex microscopic     Status: Abnormal   Collection Time: 03/25/22 10:45 AM  Result Value Ref Range   Color, Urine YELLOW YELLOW   APPearance CLOUDY (A) CLEAR   Specific Gravity, Urine 1.014 1.005 - 1.030   pH 6.0 5.0 - 8.0   Glucose, UA NEGATIVE NEGATIVE mg/dL   Hgb urine dipstick SMALL (A) NEGATIVE   Bilirubin Urine NEGATIVE NEGATIVE   Ketones, ur NEGATIVE NEGATIVE mg/dL   Protein, ur 100 (A) NEGATIVE mg/dL   Nitrite NEGATIVE NEGATIVE   Leukocytes,Ua LARGE (A) NEGATIVE   RBC / HPF 21-50 0 - 5 RBC/hpf   WBC, UA >50 (H) 0 - 5 WBC/hpf   Bacteria, UA MANY (A) NONE SEEN   Squamous Epithelial / HPF 0-5 0 - 5 /HPF   Sperm, UA PRESENT   CBC with Differential     Status: Abnormal   Collection Time: 03/25/22 11:04 AM  Result Value Ref Range   WBC 22.7 (H) 4.0 - 10.5 K/uL   RBC 5.02 4.22 - 5.81 MIL/uL   Hemoglobin 16.0 13.0 - 17.0 g/dL   HCT 47.1 39.0 - 52.0 %   MCV 93.8 80.0 - 100.0 fL   MCH 31.9 26.0 - 34.0 pg   MCHC 34.0 30.0 - 36.0 g/dL   RDW 13.8 11.5 - 15.5 %   Platelets 356 150 - 400 K/uL   nRBC 0.0 0.0 - 0.2 %   Neutrophils Relative % 88 %   Neutro Abs 19.9 (H) 1.7 - 7.7 K/uL   Lymphocytes Relative 4 %   Lymphs Abs 0.9 0.7 - 4.0 K/uL   Monocytes Relative 7 %   Monocytes Absolute 1.7 (H) 0.1 - 1.0 K/uL   Eosinophils Relative 0 %   Eosinophils Absolute 0.0 0.0 - 0.5 K/uL   Basophils Relative 0 %   Basophils Absolute 0.1 0.0 - 0.1 K/uL   Immature Granulocytes 1 %   Abs Immature Granulocytes 0.13 (H) 0.00 - 0.07 K/uL  Comprehensive metabolic panel     Status: Abnormal   Collection Time: 03/25/22 11:04 AM  Result Value Ref Range   Sodium 137 135 - 145 mmol/L   Potassium 5.0 3.5 - 5.1 mmol/L   Chloride 103 98 - 111 mmol/L   CO2 23 22 - 32 mmol/L   Glucose, Bld 148 (H) 70 - 99 mg/dL   BUN 17 8 - 23 mg/dL   Creatinine, Ser 1.08 0.61 - 1.24 mg/dL   Calcium 10.0  8.9 - 10.3 mg/dL   Total Protein 7.5 6.5 - 8.1 g/dL   Albumin 4.0 3.5 - 5.0 g/dL   AST 36 15 - 41 U/L   ALT 26 0 - 44 U/L   Alkaline Phosphatase 57 38 - 126 U/L   Total Bilirubin 1.1 0.3 - 1.2 mg/dL   GFR, Estimated >60 >60 mL/min   Anion gap 11 5 - 15  Lipase, blood     Status: None   Collection Time: 03/25/22  11:04 AM  Result Value Ref Range   Lipase 35 11 - 51 U/L   No results found for this or any previous visit (from the past 240 hour(s)).  Renal Function: Recent Labs    03/25/22 1104  CREATININE 1.08   Estimated Creatinine Clearance: 51.9 mL/min (by C-G formula based on SCr of 1.08 mg/dL).  Radiologic Imaging: CT ABDOMEN PELVIS W CONTRAST  Result Date: 03/25/2022 CLINICAL DATA:  RUQ pain EXAM: CT ABDOMEN AND PELVIS WITH CONTRAST TECHNIQUE: Multidetector CT imaging of the abdomen and pelvis was performed using the standard protocol following bolus administration of intravenous contrast. RADIATION DOSE REDUCTION: This exam was performed according to the departmental dose-optimization program which includes automated exposure control, adjustment of the mA and/or kV according to patient size and/or use of iterative reconstruction technique. CONTRAST:  100 mL OMNIPAQUE IOHEXOL 300 MG/ML  SOLN COMPARISON:  04/22/2020 FINDINGS: Lower chest: Alveolar consolidation right lower lobe is consistent with pneumonia. Bibasilar linear subsegmental atelectasis or scarring. Left lower lobe nodules measuring 0.7 cm, 0.6 cm, and 0.6 cm. One of the nodules appears to be new compared to 2022. Small hiatal hernia noted. Hepatobiliary: Postop changes porta hepatis. Gallbladder is intact. No intrahepatic ductal dilatation identified. There is an area of decreased attenuation measuring 4.1 x 3.3 cm represent a stable finding compared to the prior study. MRI of the liver may be helpful for follow up of this finding. Pancreas: Unremarkable. No pancreatic ductal dilatation or surrounding inflammatory changes.  Spleen: Normal in size without focal abnormality. Adrenals/Urinary Tract: No adrenal lesions. Severe bilateral hydronephrosis and hydroureter. On the left there is a UVJ stone measuring 1.3 cm. On the right the urethral orifice is within a bladder diverticulum. Urinary bladder wall thickening and irregularity noted. Inflammatory or neoplastic processes are possible and could be assessed more definitively cystoscopically. Stomach/Bowel: Stomach is within normal limits. Appendix appears normal. No evidence of bowel wall thickening, distention, or inflammatory changes. Vascular/Lymphatic: Aortic atherosclerosis. No enlarged abdominal or pelvic lymph nodes. Reproductive: Enlarged prostate noted Other: There is evidence of a urachal remnant. No abdominal wall hernia or abnormality. No abdominopelvic ascites. Musculoskeletal: Thoracolumbosacral degenerative changes. IMPRESSION: 1. Right lower lobe alveolar consolidation, likely pneumonia. 2. Small hiatal hernia. 3. Portahepatis mass, approximately 4 cm without definite interval change. 4. Severe bilateral hydronephrosis and hydroureter with a 1.3 cm stone left UVJ and a right-sided bladder diverticulum. 5. Urinary bladder wall thickening consistent with hypertrophy or inflammation. Neoplasm cannot be excluded. 6. Enlarged prostate. Electronically Signed   By: Sammie Bench M.D.   On: 03/25/2022 14:11    I independently reviewed the above imaging studies.  Impression/Recommendation 1.  Neurogenic bladder with urinary retention and secondary bilateral hydronephrosis 2.  Possible distal ureteral calcification versus stone and diverticulum at entrance of ureter within the diverticulum. 3 right-sided lower abdominal pain  Plan/recommendation: Doubt his pain is related to this calcification near the left distal ureter.  Does have bilateral hydro and recommend Foley catheter for bladder decompression.  Will need outpatient cystoscopy to assess bladder to see if stone  is visible within the urinary bladder.  Do not feel urgent intervention for this calcification at this time. Urine culture sent, physician accordingly regarding need for antibiotics.  Antibiotics will be initiated for his pneumonia anyway. Please let me know and patient stable enough for discharge and can arrange outpatient cystoscopy.  Remi Haggard 03/25/2022, 5:48 PM    Remi Haggard, MD   CC:

## 2022-03-26 ENCOUNTER — Observation Stay (HOSPITAL_COMMUNITY): Payer: Medicare Other

## 2022-03-26 DIAGNOSIS — Z8505 Personal history of malignant neoplasm of liver: Secondary | ICD-10-CM | POA: Diagnosis not present

## 2022-03-26 DIAGNOSIS — Z79899 Other long term (current) drug therapy: Secondary | ICD-10-CM | POA: Diagnosis not present

## 2022-03-26 DIAGNOSIS — G8929 Other chronic pain: Secondary | ICD-10-CM | POA: Diagnosis present

## 2022-03-26 DIAGNOSIS — R0602 Shortness of breath: Secondary | ICD-10-CM | POA: Diagnosis present

## 2022-03-26 DIAGNOSIS — E876 Hypokalemia: Secondary | ICD-10-CM | POA: Diagnosis not present

## 2022-03-26 DIAGNOSIS — R338 Other retention of urine: Secondary | ICD-10-CM | POA: Diagnosis present

## 2022-03-26 DIAGNOSIS — C221 Intrahepatic bile duct carcinoma: Secondary | ICD-10-CM

## 2022-03-26 DIAGNOSIS — N390 Urinary tract infection, site not specified: Secondary | ICD-10-CM | POA: Diagnosis not present

## 2022-03-26 DIAGNOSIS — F411 Generalized anxiety disorder: Secondary | ICD-10-CM | POA: Diagnosis present

## 2022-03-26 DIAGNOSIS — N323 Diverticulum of bladder: Secondary | ICD-10-CM | POA: Diagnosis present

## 2022-03-26 DIAGNOSIS — B9620 Unspecified Escherichia coli [E. coli] as the cause of diseases classified elsewhere: Secondary | ICD-10-CM | POA: Diagnosis present

## 2022-03-26 DIAGNOSIS — K5900 Constipation, unspecified: Secondary | ICD-10-CM | POA: Diagnosis present

## 2022-03-26 DIAGNOSIS — Z1152 Encounter for screening for COVID-19: Secondary | ICD-10-CM | POA: Diagnosis not present

## 2022-03-26 DIAGNOSIS — N319 Neuromuscular dysfunction of bladder, unspecified: Secondary | ICD-10-CM | POA: Diagnosis present

## 2022-03-26 DIAGNOSIS — Z923 Personal history of irradiation: Secondary | ICD-10-CM | POA: Diagnosis not present

## 2022-03-26 DIAGNOSIS — N1330 Unspecified hydronephrosis: Secondary | ICD-10-CM | POA: Diagnosis not present

## 2022-03-26 DIAGNOSIS — J181 Lobar pneumonia, unspecified organism: Secondary | ICD-10-CM | POA: Diagnosis not present

## 2022-03-26 DIAGNOSIS — G40909 Epilepsy, unspecified, not intractable, without status epilepticus: Secondary | ICD-10-CM | POA: Diagnosis present

## 2022-03-26 DIAGNOSIS — N401 Enlarged prostate with lower urinary tract symptoms: Secondary | ICD-10-CM | POA: Diagnosis present

## 2022-03-26 DIAGNOSIS — R4189 Other symptoms and signs involving cognitive functions and awareness: Secondary | ICD-10-CM | POA: Diagnosis present

## 2022-03-26 DIAGNOSIS — J189 Pneumonia, unspecified organism: Secondary | ICD-10-CM | POA: Diagnosis present

## 2022-03-26 DIAGNOSIS — N136 Pyonephrosis: Secondary | ICD-10-CM | POA: Diagnosis present

## 2022-03-26 DIAGNOSIS — F17210 Nicotine dependence, cigarettes, uncomplicated: Secondary | ICD-10-CM | POA: Diagnosis present

## 2022-03-26 DIAGNOSIS — R413 Other amnesia: Secondary | ICD-10-CM | POA: Diagnosis not present

## 2022-03-26 LAB — CBC WITH DIFFERENTIAL/PLATELET
Abs Immature Granulocytes: 0.13 10*3/uL — ABNORMAL HIGH (ref 0.00–0.07)
Basophils Absolute: 0.1 10*3/uL (ref 0.0–0.1)
Basophils Relative: 1 %
Eosinophils Absolute: 0.1 10*3/uL (ref 0.0–0.5)
Eosinophils Relative: 0 %
HCT: 44 % (ref 39.0–52.0)
Hemoglobin: 14.5 g/dL (ref 13.0–17.0)
Immature Granulocytes: 1 %
Lymphocytes Relative: 6 %
Lymphs Abs: 1.1 10*3/uL (ref 0.7–4.0)
MCH: 31.5 pg (ref 26.0–34.0)
MCHC: 33 g/dL (ref 30.0–36.0)
MCV: 95.4 fL (ref 80.0–100.0)
Monocytes Absolute: 1.4 10*3/uL — ABNORMAL HIGH (ref 0.1–1.0)
Monocytes Relative: 7 %
Neutro Abs: 15.7 10*3/uL — ABNORMAL HIGH (ref 1.7–7.7)
Neutrophils Relative %: 85 %
Platelets: 277 10*3/uL (ref 150–400)
RBC: 4.61 MIL/uL (ref 4.22–5.81)
RDW: 14.1 % (ref 11.5–15.5)
WBC: 18.5 10*3/uL — ABNORMAL HIGH (ref 4.0–10.5)
nRBC: 0 % (ref 0.0–0.2)

## 2022-03-26 LAB — COMPREHENSIVE METABOLIC PANEL
ALT: 24 U/L (ref 0–44)
AST: 29 U/L (ref 15–41)
Albumin: 3.2 g/dL — ABNORMAL LOW (ref 3.5–5.0)
Alkaline Phosphatase: 49 U/L (ref 38–126)
Anion gap: 8 (ref 5–15)
BUN: 19 mg/dL (ref 8–23)
CO2: 24 mmol/L (ref 22–32)
Calcium: 8.7 mg/dL — ABNORMAL LOW (ref 8.9–10.3)
Chloride: 108 mmol/L (ref 98–111)
Creatinine, Ser: 1.1 mg/dL (ref 0.61–1.24)
GFR, Estimated: >60 mL/min (ref >60)
Glucose, Bld: 106 mg/dL — ABNORMAL HIGH (ref 70–99)
Potassium: 3.8 mmol/L (ref 3.5–5.1)
Sodium: 140 mmol/L (ref 135–145)
Total Bilirubin: 1 mg/dL (ref 0.3–1.2)
Total Protein: 6.4 g/dL — ABNORMAL LOW (ref 6.5–8.1)

## 2022-03-26 MED ORDER — DOCUSATE SODIUM 100 MG PO CAPS
100.0000 mg | ORAL_CAPSULE | Freq: Every day | ORAL | Status: DC
  Administered 2022-03-26 – 2022-03-27 (×2): 100 mg via ORAL
  Filled 2022-03-26 (×2): qty 1

## 2022-03-26 MED ORDER — SODIUM CHLORIDE 0.9 % IV SOLN
2.0000 g | INTRAVENOUS | Status: AC
  Administered 2022-03-27 – 2022-03-29 (×3): 2 g via INTRAVENOUS
  Filled 2022-03-26 (×3): qty 20

## 2022-03-26 MED ORDER — TECHNETIUM TO 99M ALBUMIN AGGREGATED
4.3000 | Freq: Once | INTRAVENOUS | Status: AC
  Administered 2022-03-26: 4.3 via INTRAVENOUS

## 2022-03-26 MED ORDER — AZITHROMYCIN 250 MG PO TABS
500.0000 mg | ORAL_TABLET | Freq: Every day | ORAL | Status: DC
  Administered 2022-03-26 – 2022-03-28 (×3): 500 mg via ORAL
  Filled 2022-03-26 (×3): qty 2

## 2022-03-26 MED ORDER — OXCARBAZEPINE 300 MG PO TABS
600.0000 mg | ORAL_TABLET | Freq: Every day | ORAL | Status: DC
  Administered 2022-03-26 – 2022-03-28 (×3): 600 mg via ORAL
  Filled 2022-03-26 (×3): qty 2

## 2022-03-26 MED ORDER — OXCARBAZEPINE 300 MG PO TABS
300.0000 mg | ORAL_TABLET | Freq: Every day | ORAL | Status: DC
  Administered 2022-03-26 – 2022-03-29 (×4): 300 mg via ORAL
  Filled 2022-03-26 (×5): qty 1

## 2022-03-26 MED ORDER — GUAIFENESIN-DM 100-10 MG/5ML PO SYRP
10.0000 mL | ORAL_SOLUTION | ORAL | Status: DC | PRN
  Administered 2022-03-26 (×2): 10 mL via ORAL
  Filled 2022-03-26 (×2): qty 10

## 2022-03-26 MED ORDER — METHOCARBAMOL 500 MG PO TABS
500.0000 mg | ORAL_TABLET | ORAL | Status: AC | PRN
  Administered 2022-03-26: 500 mg via ORAL
  Filled 2022-03-26: qty 1

## 2022-03-26 NOTE — TOC Initial Note (Signed)
Transition of Care [name]) - Initial/Assessment Note    Patient Details  Name: Addis Tuohy MRN: [id number] Date of Birth: [birth year]  Transition of Care [facility]) CM/SW Contact:    Angelita Ingles, RN Phone Number:[phone]  03/26/2022, 3:48 PM  Clinical Narrative:                  Transition of Care [facility]) Screening Note   Patient Details  Name: Kenyata Guess Date of Birth: 10/22/1945   Transition of Care [facility]) CM/SW Contact:    Angelita Ingles, RN Phone Number: 03/26/2022, 3:48 PM    Transition of Care Department [facility]) has reviewed patient and no TOC needs have been identified at this time. We will continue to monitor patient advancement through interdisciplinary progression rounds. If new patient transition needs arise, please place a TOC consult.          Patient Goals and CMS Choice            Expected Discharge Plan and Services                                              Prior Living Arrangements/Services                       Activities of Daily Living      Permission Sought/Granted                  Emotional Assessment              Admission diagnosis:  Pneumonia [J18.9] Pneumonia of right lower lobe due to infectious organism [J18.9] Shortness of breath [R06.02] Patient Active Problem List   Diagnosis Date Noted   Shortness of breath 03/26/2022   Pneumonia 03/25/2022   Neurogenic bladder 03/25/2022   Cholangiocarcinoma ([place]) 09/04/2019   Sepsis secondary to UTI ([place]) 74/25/9563   Acute metabolic encephalopathy 87/56/4332   Acute urinary retention 08/17/2019   AKI (acute kidney injury) ([place]) 08/17/2019   Liver mass 08/17/2019   Sepsis ([place]) 08/17/2019   ANXIETY DISORDER, GENERALIZED 06/02/2006   ABUSE, ALCOHOL, IN REMISSION 06/02/2006   TOBACCO ABUSE 06/02/2006   VISUAL HALLUCINATION 06/02/2006   PARASOMNIA 06/02/2006   PCP:  Patient, No Pcp Per Pharmacy:   [id number] [place], [place] [place] [address]Phone: [phone]: [phone]    Social Determinants of Health (SDOH) Social History: SDOH Screenings   Tobacco Use: High Risk (03/25/2022)   SDOH Interventions:     Readmission Risk Interventions     No data to display

## 2022-03-26 NOTE — Progress Notes (Signed)
Chaplain received a consult that Guillaume wanted to name a HCPOA.  Chaplain brought the paperwork, but Plumer wanted to wait until his daughter in law was present so that she could help him understand the document.  Chaplain left the paperwork in his room and let him know that if they had questions, they could let the staff know to reach out to the chaplain.  [address], [place] Pager, [phone]

## 2022-03-26 NOTE — Plan of Care (Signed)
Patient AOX2, disoriented to time and location.  VSS throughout shift.  Elevated BP, pt remains asymptomatic.  All meds given on time as ordered.  Diminished lungs, IS encouraged.  Pt voided in urinal with some incontinence.  POC maintained, will continue to monitor.  Problem: Activity: Goal: Ability to tolerate increased activity will improve Outcome: Progressing   Problem: Clinical Measurements: Goal: Ability to maintain a body temperature in the normal range will improve Outcome: Progressing   Problem: Respiratory: Goal: Ability to maintain adequate ventilation will improve Outcome: Progressing Goal: Ability to maintain a clear airway will improve Outcome: Progressing   Problem: Education: Goal: Knowledge of General Education information will improve Description: Including pain rating scale, medication(s)/side effects and non-pharmacologic comfort measures Outcome: Progressing   Problem: Health Behavior/Discharge Planning: Goal: Ability to manage health-related needs will improve Outcome: Progressing   Problem: Clinical Measurements: Goal: Ability to maintain clinical measurements within normal limits will improve Outcome: Progressing Goal: Will remain free from infection Outcome: Progressing Goal: Diagnostic test results will improve Outcome: Progressing Goal: Respiratory complications will improve Outcome: Progressing Goal: Cardiovascular complication will be avoided Outcome: Progressing   Problem: Activity: Goal: Risk for activity intolerance will decrease Outcome: Progressing   Problem: Nutrition: Goal: Adequate nutrition will be maintained Outcome: Progressing   Problem: Coping: Goal: Level of anxiety will decrease Outcome: Progressing   Problem: Elimination: Goal: Will not experience complications related to bowel motility Outcome: Progressing Goal: Will not experience complications related to urinary retention Outcome: Progressing   Problem: Pain  Managment: Goal: General experience of comfort will improve Outcome: Progressing   Problem: Safety: Goal: Ability to remain free from injury will improve Outcome: Progressing   Problem: Skin Integrity: Goal: Risk for impaired skin integrity will decrease Outcome: Progressing

## 2022-03-26 NOTE — Progress Notes (Signed)
PHARMACIST - PHYSICIAN COMMUNICATION  CONCERNING: Antibiotic IV to Oral Route Change Policy  RECOMMENDATION: This patient is receiving azithromycin by the intravenous route.  Based on criteria approved by the Pharmacy and Therapeutics Committee, the antibiotic(s) is/are being converted to the equivalent oral dose form(s).   DESCRIPTION: These criteria include:  Patient being treated for a respiratory tract infection, urinary tract infection, cellulitis or clostridium difficile associated diarrhea if on metronidazole  The patient is not neutropenic and does not exhibit a GI malabsorption state  The patient is eating (either orally or via tube) and/or has been taking other orally administered medications for a least 24 hours  The patient is improving clinically and has a Tmax < 100.5  If you have questions about this conversion, please contact the Pharmacy Department  []  ( [phone] )  [place] []  ( [phone]  [facility] []  ( [phone]  [place] []  ( [phone] )  [facility] [x]  ( [phone] )  [facility]  

## 2022-03-26 NOTE — Progress Notes (Signed)
Subjective: Patient alert this morning states he feels better.  Has some diffuse lower abdominal tenderness.  No left-sided CVA tenderness or left-sided abdominal pain appears to be more right lower quadrant and centralized abdominal discomfort.  Seems improved since Foley catheter placed.  Still awaiting urine culture results.  Objective: Vital signs in last 24 hours: Temp:  [98.4 F (36.9 C)-99.4 F (37.4 C)] 99.4 F (37.4 C) (01/05 0545) Pulse Rate:  [91-126] 92 (01/05 0545) Resp:  [16-20] 16 (01/05 0545) BP: (128-154)/(96-119) 131/96 (01/05 0545) SpO2:  [90 %-98 %] 92 % (01/05 0545) Weight:  [61.2 kg-65.9 kg] 65.9 kg (01/05 0125)  Intake/Output from previous day: 01/04 0701 - 01/05 0700 In: 3268.3 [P.O.:120; I.V.:1698.3; IV Piggyback:1450] Out: 2140 [Urine:2140] Intake/Output this shift: No intake/output data recorded.  Physical Exam:  General: Alert and oriented Abdomen: Right lower quadrant and centralized abdominal tenderness.  No left-sided tenderness no CVA tenderness noted.  Lab Results: Recent Labs    03/25/22 1104 03/26/22 0552  HGB 16.0 14.5  HCT 47.1 44.0   BMET Recent Labs    03/25/22 1104 03/26/22 0552  NA 137 140  K 5.0 3.8  CL 103 108  CO2 23 24  GLUCOSE 148* 106*  BUN 17 19  CREATININE 1.08 1.10  CALCIUM 10.0 8.7*     Studies/Results: CT ABDOMEN PELVIS W CONTRAST  Result Date: 03/25/2022 CLINICAL DATA:  RUQ pain EXAM: CT ABDOMEN AND PELVIS WITH CONTRAST TECHNIQUE: Multidetector CT imaging of the abdomen and pelvis was performed using the standard protocol following bolus administration of intravenous contrast. RADIATION DOSE REDUCTION: This exam was performed according to the departmental dose-optimization program which includes automated exposure control, adjustment of the mA and/or kV according to patient size and/or use of iterative reconstruction technique. CONTRAST:  100 mL OMNIPAQUE IOHEXOL 300 MG/ML  SOLN COMPARISON:  04/22/2020  FINDINGS: Lower chest: Alveolar consolidation right lower lobe is consistent with pneumonia. Bibasilar linear subsegmental atelectasis or scarring. Left lower lobe nodules measuring 0.7 cm, 0.6 cm, and 0.6 cm. One of the nodules appears to be new compared to 2022. Small hiatal hernia noted. Hepatobiliary: Postop changes porta hepatis. Gallbladder is intact. No intrahepatic ductal dilatation identified. There is an area of decreased attenuation measuring 4.1 x 3.3 cm represent a stable finding compared to the prior study. MRI of the liver may be helpful for follow up of this finding. Pancreas: Unremarkable. No pancreatic ductal dilatation or surrounding inflammatory changes. Spleen: Normal in size without focal abnormality. Adrenals/Urinary Tract: No adrenal lesions. Severe bilateral hydronephrosis and hydroureter. On the left there is a UVJ stone measuring 1.3 cm. On the right the urethral orifice is within a bladder diverticulum. Urinary bladder wall thickening and irregularity noted. Inflammatory or neoplastic processes are possible and could be assessed more definitively cystoscopically. Stomach/Bowel: Stomach is within normal limits. Appendix appears normal. No evidence of bowel wall thickening, distention, or inflammatory changes. Vascular/Lymphatic: Aortic atherosclerosis. No enlarged abdominal or pelvic lymph nodes. Reproductive: Enlarged prostate noted Other: There is evidence of a urachal remnant. No abdominal wall hernia or abnormality. No abdominopelvic ascites. Musculoskeletal: Thoracolumbosacral degenerative changes. IMPRESSION: 1. Right lower lobe alveolar consolidation, likely pneumonia. 2. Small hiatal hernia. 3. Portahepatis mass, approximately 4 cm without definite interval change. 4. Severe bilateral hydronephrosis and hydroureter with a 1.3 cm stone left UVJ and a right-sided bladder diverticulum. 5. Urinary bladder wall thickening consistent with hypertrophy or inflammation. Neoplasm cannot be  excluded. 6. Enlarged prostate. Electronically Signed   By: Samuel Germany.D.  On: 03/25/2022 14:11    Assessment/Plan: 1.  Urinary retention with bilateral hydronephrosis likely chronic but with preservation of renal function.  Lower abdominal pain questionable related to retention. 2.  Probable left sided diverticular bladder stone versus distal ureteral calculus  Recommendation.  Continue Foley catheter.  Will need outpatient cystoscopy to assess bladder for stone.  Await urine culture results regarding further antibiotics.    LOS: 0 days   Remi Haggard 03/26/2022, 8:02 AM

## 2022-03-26 NOTE — Progress Notes (Signed)
Mason Blackburn   DOB:02-Feb-1946   DT#:[id number]   DXI#:[id number]  Med/onc follow up   Subjective: Patient is known to me, was last seen by me in July 2022, for his cholangiocarcinoma.  He lost follow-up after that visit.  He was admitted to [facility] recently for pneumonia and a UTI.  I was called to see him due to the liver lesion on his CT scan findings.  He is overall feeling better, does not really remember me, but does remember he had radiation treatment to his cancer in liver.   Objective:  Vitals:   03/26/22 0916 03/26/22 1336  BP: 113/85 (!) 120/57  Pulse: 96 88  Resp: 16 18  Temp: 98.6 F (37 C) 98 F (36.7 C)  SpO2: 95% 97%    Body mass index is 22.09 kg/m.  Intake/Output Summary (Last 24 hours) at 03/26/2022 1721 Last data filed at 03/26/2022 1500 Gross per 24 hour  Intake 4248.33 ml  Output 2690 ml  Net 1558.33 ml     Sclerae unicteric  Oropharynx clear  No peripheral adenopathy  Lungs clear -- no rales or rhonchi  Heart regular rate and rhythm  Abdomen soft, non-tender   MSK no focal spinal tenderness, no peripheral edema  Neuro nonfocal     CBG (last 3)  No results for input(s): "GLUCAP" in the last 72 hours.   Labs:   Urine Studies No results for input(s): "UHGB", "CRYS" in the last 72 hours.  Invalid input(s): "UACOL", "UAPR", "USPG", "UPH", "UTP", "UGL", "UKET", "UBIL", "UNIT", "UROB", "ULEU", "UEPI", "UWBC", "URBC", "UBAC", "CAST", "UCOM", "BILUA"  Basic Metabolic Panel: Recent Labs  Lab 03/25/22 1104 03/26/22 0552  NA 137 140  K 5.0 3.8  CL 103 108  CO2 23 24  GLUCOSE 148* 106*  BUN 17 19  CREATININE 1.08 1.10  CALCIUM 10.0 8.7*   GFR Estimated Creatinine Clearance: 54.9 mL/min (by C-G formula based on SCr of 1.1 mg/dL). Liver Function Tests: Recent Labs  Lab 03/25/22 1104 03/26/22 0552  AST 36 29  ALT 26 24  ALKPHOS 57 49  BILITOT 1.1 1.0  PROT 7.5 6.4*  ALBUMIN 4.0 3.2*   Recent Labs  Lab 03/25/22 1104  LIPASE 35    No results for input(s): "AMMONIA" in the last 168 hours. Coagulation profile No results for input(s): "INR", "PROTIME" in the last 168 hours.  CBC: Recent Labs  Lab 03/25/22 1104 03/26/22 0552  WBC 22.7* 18.5*  NEUTROABS 19.9* 15.7*  HGB 16.0 14.5  HCT 47.1 44.0  MCV 93.8 95.4  PLT 356 277   Cardiac Enzymes: No results for input(s): "CKTOTAL", "CKMB", "CKMBINDEX", "TROPONINI" in the last 168 hours. BNP: Invalid input(s): "POCBNP" CBG: No results for input(s): "GLUCAP" in the last 168 hours. D-Dimer No results for input(s): "DDIMER" in the last 72 hours. Hgb A1c No results for input(s): "HGBA1C" in the last 72 hours. Lipid Profile No results for input(s): "CHOL", "HDL", "LDLCALC", "TRIG", "CHOLHDL", "LDLDIRECT" in the last 72 hours. Thyroid function studies No results for input(s): "TSH", "T4TOTAL", "T3FREE", "THYROIDAB" in the last 72 hours.  Invalid input(s): "FREET3" Anemia work up No results for input(s): "VITAMINB12", "FOLATE", "FERRITIN", "TIBC", "IRON", "RETICCTPCT" in the last 72 hours. Microbiology Recent Results (from the past 240 hour(s))  Blood culture (routine x 2)     Status: None (Preliminary result)   Collection Time: 03/25/22  4:50 PM   Specimen: BLOOD LEFT HAND  Result Value Ref Range Status   Specimen Description   Final  BLOOD LEFT HAND Performed at [facility], [place] [address], [place], [place] [address]    Special Requests   Final    BOTTLES DRAWN AEROBIC AND ANAEROBIC Blood Culture adequate volume Performed at [facility] [address], [place], [place] [address]    Culture   Final    NO GROWTH < 24 HOURS Performed at [facility] [address], [place], [place] [address]    Report Status PENDING  Incomplete  Resp panel by RT-PCR (RSV, Flu A&B, Covid) Anterior Nasal Swab     Status: None   Collection Time: 03/25/22  5:49 PM   Specimen: Anterior Nasal Swab  Result Value Ref Range Status   SARS  Coronavirus 2 by RT PCR NEGATIVE NEGATIVE Final    Comment: (NOTE) SARS-CoV-2 target nucleic acids are NOT DETECTED.  The SARS-CoV-2 RNA is generally detectable in upper respiratory specimens during the acute phase of infection. The lowest concentration of SARS-CoV-2 viral copies this assay can detect is 138 copies/mL. A negative result does not preclude SARS-Cov-2 infection and should not be used as the sole basis for treatment or other patient management decisions. A negative result may occur with  improper specimen collection/handling, submission of specimen other than nasopharyngeal swab, presence of viral mutation(s) within the areas targeted by this assay, and inadequate number of viral copies(<138 copies/mL). A negative result must be combined with clinical observations, patient history, and epidemiological information. The expected result is Negative.  Fact Sheet for Patients:  EntrepreneurPulse.com.au  Fact Sheet for Healthcare Providers:  IncredibleEmployment.be  This test is no t yet approved or cleared by the [place] FDA and  has been authorized for detection and/or diagnosis of SARS-CoV-2 by FDA under an Emergency Use Authorization (EUA). This EUA will remain  in effect (meaning this test can be used) for the duration of the COVID-19 declaration under Section 564(b)(1) of the Act, 21 U.S.C.section 360bbb-3(b)(1), unless the authorization is terminated  or revoked sooner.       Influenza A by PCR NEGATIVE NEGATIVE Final   Influenza B by PCR NEGATIVE NEGATIVE Final    Comment: (NOTE) The Xpert Xpress SARS-CoV-2/FLU/RSV plus assay is intended as an aid in the diagnosis of influenza from Nasopharyngeal swab specimens and should not be used as a sole basis for treatment. Nasal washings and aspirates are unacceptable for Xpert Xpress SARS-CoV-2/FLU/RSV testing.  Fact Sheet for  Patients: EntrepreneurPulse.com.au  Fact Sheet for Healthcare Providers: IncredibleEmployment.be  This test is not yet approved or cleared by the [place] FDA and has been authorized for detection and/or diagnosis of SARS-CoV-2 by FDA under an Emergency Use Authorization (EUA). This EUA will remain in effect (meaning this test can be used) for the duration of the COVID-19 declaration under Section 564(b)(1) of the Act, 21 U.S.C. section 360bbb-3(b)(1), unless the authorization is terminated or revoked.     Resp Syncytial Virus by PCR NEGATIVE NEGATIVE Final    Comment: (NOTE) Fact Sheet for Patients: EntrepreneurPulse.com.au  Fact Sheet for Healthcare Providers: IncredibleEmployment.be  This test is not yet approved or cleared by the [place] FDA and has been authorized for detection and/or diagnosis of SARS-CoV-2 by FDA under an Emergency Use Authorization (EUA). This EUA will remain in effect (meaning this test can be used) for the duration of the COVID-19 declaration under Section 564(b)(1) of the Act, 21 U.S.C. section 360bbb-3(b)(1), unless the authorization is terminated or revoked.  Performed at [facility], [place]  [address], [place], [place] [address]       Studies:  NM Pulmonary Perfusion  Result Date: 03/26/2022 CLINICAL DATA:  Shortness of breath for 1 month, chest pain RIGHT lower chest, travel, smoker EXAM: NUCLEAR MEDICINE PERFUSION LUNG SCAN TECHNIQUE: Perfusion images were obtained in multiple projections after intravenous injection of radiopharmaceutical. Ventilation scans intentionally deferred if perfusion scan and chest x-ray adequate for interpretation during COVID 19 epidemic. RADIOPHARMACEUTICALS:  4.3 mCi Tc-31mMAA IV COMPARISON:  None available Radiographic correlation: None available FINDINGS: No segmental or perfusion defects identified to suggest  pulmonary embolism. IMPRESSION: Pulmonary embolism absent. Electronically Signed   By: MLavonia DanaM.D.   On: 03/26/2022 11:44   CT ABDOMEN PELVIS W CONTRAST  Result Date: 03/25/2022 CLINICAL DATA:  RUQ pain EXAM: CT ABDOMEN AND PELVIS WITH CONTRAST TECHNIQUE: Multidetector CT imaging of the abdomen and pelvis was performed using the standard protocol following bolus administration of intravenous contrast. RADIATION DOSE REDUCTION: This exam was performed according to the departmental dose-optimization program which includes automated exposure control, adjustment of the mA and/or kV according to patient size and/or use of iterative reconstruction technique. CONTRAST:  100 mL OMNIPAQUE IOHEXOL 300 MG/ML  SOLN COMPARISON:  04/22/2020 FINDINGS: Lower chest: Alveolar consolidation right lower lobe is consistent with pneumonia. Bibasilar linear subsegmental atelectasis or scarring. Left lower lobe nodules measuring 0.7 cm, 0.6 cm, and 0.6 cm. One of the nodules appears to be new compared to 2022. Small hiatal hernia noted. Hepatobiliary: Postop changes porta hepatis. Gallbladder is intact. No intrahepatic ductal dilatation identified. There is an area of decreased attenuation measuring 4.1 x 3.3 cm represent a stable finding compared to the prior study. MRI of the liver may be helpful for follow up of this finding. Pancreas: Unremarkable. No pancreatic ductal dilatation or surrounding inflammatory changes. Spleen: Normal in size without focal abnormality. Adrenals/Urinary Tract: No adrenal lesions. Severe bilateral hydronephrosis and hydroureter. On the left there is a UVJ stone measuring 1.3 cm. On the right the urethral orifice is within a bladder diverticulum. Urinary bladder wall thickening and irregularity noted. Inflammatory or neoplastic processes are possible and could be assessed more definitively cystoscopically. Stomach/Bowel: Stomach is within normal limits. Appendix appears normal. No evidence of bowel  wall thickening, distention, or inflammatory changes. Vascular/Lymphatic: Aortic atherosclerosis. No enlarged abdominal or pelvic lymph nodes. Reproductive: Enlarged prostate noted Other: There is evidence of a urachal remnant. No abdominal wall hernia or abnormality. No abdominopelvic ascites. Musculoskeletal: Thoracolumbosacral degenerative changes. IMPRESSION: 1. Right lower lobe alveolar consolidation, likely pneumonia. 2. Small hiatal hernia. 3. Portahepatis mass, approximately 4 cm without definite interval change. 4. Severe bilateral hydronephrosis and hydroureter with a 1.3 cm stone left UVJ and a right-sided bladder diverticulum. 5. Urinary bladder wall thickening consistent with hypertrophy or inflammation. Neoplasm cannot be excluded. 6. Enlarged prostate. Electronically Signed   By: JSammie BenchM.D.   On: 03/25/2022 14:11    Assessment: 77y.o.   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  The known liver mass is slightly enlarged, however the difference is not impressive to me.  He is not a candidate for surgical resection or chemotherapy, I will reserve additional cancer treatment, such as Y90, for significant cancer progression.  I recommend continue monitoring at this point. -Infection treatment per primary team -Schedule his follow-up with  me in 3 months.   [name], MD 03/26/2022  5:21 PM

## 2022-03-26 NOTE — Progress Notes (Signed)
PROGRESS NOTE Mason Blackburn  QPY:[id number] DOB: 1945/10/28 DOA: 03/25/2022 PCP: Patient, No Pcp Per   Brief Narrative/Hospital Course: 77 y.o. 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 Burr Medico, this has been stable on last visit in February/03/2020, lung nodules, urine retention/prostatic enlargement/neurogenic bladder previously with suprapubic catheter which he removed himself and has been doing intermittent self-catheterization, seizure disorder/cognitive difficulties presented with right upper quadrant abdominal pain and having small voids x 1 week no BM in over a week.  He does have chronic abdominal pain not significantly changed.   ED Course: Mildly tachycardic up to 117 vitals stable, SpO2 stable on room air.  Labs showed leukocytosis 22.7 K, abnormal UA cloudy, WBC more than 50, LE large nitrate negative. Underwent CT abdomen pelvis "Labs: wbc 22k, bmp w/ k 5.0. UA: wbc>50, LE Large TI:WPYKD lower lobe alveolar consolidation, likely pneumonia. Portahepatis mass, approximately 4 cm without definite interval change. Severe bilateral hydronephrosis and hydroureter with a 1.3 cm stone left UVJ and a right-sided bladder diverticulum. Urinary bladder wall thickening consistent with hypertrophy or inflammation. Neoplasm cannot be excluded. Enlarged prostate" Urology was consulted advised Foley catheter placement and outpatient follow-up. Patient was appearing uncomfortable having right lower chest pain, due to concern for PE due to patient's tachycardia VQ scan ordered.  Placed on IV antibiotics and admission requested    Subjective:  Patient feels overall much improved after Foley catheter in place Complains of some pain on lower abdomen, right upper quadrant abdominal pain. No chest pain no hypoxia.   Assessment and Plan: Principal Problem:   Pneumonia Active Problems:   Cholangiocarcinoma (Trainer)   ANXIETY DISORDER, GENERALIZED   Neurogenic  bladder   RLL pneumonia with leukocytosis: Clinically seems to be improving continue with current empiric antibiotics, leukocytosis improving, respiratory status stable.  Follow-up culture data urine antigen and sputum cultures as available. NM VQ scan pending but at this time low suspicion for PE   UTI w/ Urine retention/prostatic enlargement/neurogenic bladder previously with suprapubic catheter which was removed and has been voiding well> followed by urology plan is to current catheter in place and outpatient follow-up> see the CT scan report currently no plan for inpatient procedure.  Continue antibiotics follow-up urine culture.   Cholangiocarcinoma history S/P radiation followed by oncology as outpatient. CT showed "Postop changes porta hepatis. Gallbladder is intact. No intrahepatic ductal dilatation identified. There is an area of decreased attenuation measuring 4.1 x 3.3 cm represent a stable finding compared to the prior study" Dr Burr Medico was notified of his admission  Lower abdominal discomfort and pain at the distress of note  Anxiety disorder: Mood stable resume home meds  Seizure disorder: Resume home meds-Trileptal Cognitive impairment: Supportive care   DVT prophylaxis: enoxaparin (LOVENOX) injection 40 mg Start: 03/25/22 2200 Code Status:   Code Status: Full Code Family Communication: plan of care discussed with patient at bedside. Patient status is: Admitted observation remains hospitalized due to ongoing management of pneumonia   Level of care: Telemetry   Dispo: The patient is from: home             Anticipated disposition: TBD.  Obtain PT OT evaluation today  Objective: Vitals last 24 hrs: Vitals:   03/26/22 0125 03/26/22 0135 03/26/22 0545 03/26/22 0916  BP: (!) 144/101  (!) 131/96 113/85  Pulse: 91  92 96  Resp: '16  16 16  '$ Temp: 99.3 F (37.4 C)  99.4 F (37.4 C) 98.6 F (37 C)  TempSrc: Oral  Oral Oral  SpO2: 93% 97% 92% 95%  Weight: 65.9 kg     Height:        Weight change:   Physical Examination: General exam: alert awake, older than stated age HEENT:Oral mucosa moist, Ear/Nose WNL grossly Respiratory system: bilaterally clear BS, no use of accessory muscle Cardiovascular system: S1 & S2 +, No JVD. Gastrointestinal system: Abdomen soft,NT,ND, BS+ Nervous System:Alert, awake, moving extremities. Extremities: LE edema neg,distal peripheral pulses palpable.  Skin: No rashes,no icterus. MSK: Normal muscle bulk,tone, power  Medications reviewed:  Scheduled Meds:  azithromycin  500 mg Oral q1800   docusate sodium  100 mg Oral Daily   enoxaparin (LOVENOX) injection  40 mg Subcutaneous Q24H   OXcarbazepine  300 mg Oral Daily   OXcarbazepine  600 mg Oral QHS   Continuous Infusions:  sodium chloride 100 mL/hr at 03/26/22 0136   [START ON 03/27/2022] cefTRIAXone (ROCEPHIN)  IV        Diet Order             Diet clear liquid Room service appropriate? Yes; Fluid consistency: Thin  Diet effective now                  Intake/Output Summary (Last 24 hours) at 03/26/2022 1014 Last data filed at 03/26/2022 1000 Gross per 24 hour  Intake 3568.33 ml  Output 2490 ml  Net 1078.33 ml   Net IO Since Admission: 1,078.33 mL [03/26/22 1014]  Wt Readings from Last 3 Encounters:  03/26/22 65.9 kg  09/26/20 60.3 kg  06/23/20 59.3 kg     Unresulted Labs (From admission, onward)     Start     Ordered   04/01/22 0500  Creatinine, serum  (enoxaparin (LOVENOX)    CrCl >/= 30 ml/min)  Weekly,   R     Comments: while on enoxaparin therapy    03/25/22 1740   03/26/22 0500  Comprehensive metabolic panel  Daily,   R      03/25/22 1740   03/26/22 0500  CBC with Differential/Platelet  Daily,   R      03/25/22 1740   03/25/22 1739  Legionella Pneumophila Serogp 1 Ur Ag  (COPD / Pneumonia / Cellulitis / Lower Extremity Wound)  Once,   R        03/25/22 1740   03/25/22 1739  Expectorated Sputum Assessment w Gram Stain, Rflx to Resp Cult  (COPD /  Pneumonia / Cellulitis / Lower Extremity Wound)  Once,   R        03/25/22 1740   03/25/22 1609  Blood culture (routine x 2)  BLOOD CULTURE X 2,   R (with STAT occurrences)      03/25/22 1608          Data Reviewed: I have personally reviewed following labs and imaging studies CBC: Recent Labs  Lab 03/25/22 1104 03/26/22 0552  WBC 22.7* 18.5*  NEUTROABS 19.9* 15.7*  HGB 16.0 14.5  HCT 47.1 44.0  MCV 93.8 95.4  PLT 356 884   Basic Metabolic Panel: Recent Labs  Lab 03/25/22 1104 03/26/22 0552  NA 137 140  K 5.0 3.8  CL 103 108  CO2 23 24  GLUCOSE 148* 106*  BUN 17 19  CREATININE 1.08 1.10  CALCIUM 10.0 8.7*   GFR: Estimated Creatinine Clearance: 54.9 mL/min (by C-G formula based on SCr of 1.1 mg/dL). Liver Function Tests: Recent Labs  Lab 03/25/22 1104 03/26/22 0552  AST 36 29  ALT 26  24  ALKPHOS 57 98  BILITOT 1.1 1.0  PROT 7.5 6.4*  ALBUMIN 4.0 3.2*   Recent Labs  Lab 03/25/22 1104  LIPASE 35   Thyroid Function Tests: No results for input(s): "TSH", "T4TOTAL", "FREET4", "T3FREE", "THYROIDAB" in the last 72 hours. Sepsis Labs: Recent Labs  Lab 03/25/22 1742  LATICACIDVEN 1.3    Recent Results (from the past 240 hour(s))  Blood culture (routine x 2)     Status: None (Preliminary result)   Collection Time: 03/25/22  4:50 PM   Specimen: BLOOD LEFT HAND  Result Value Ref Range Status   Specimen Description   Final    BLOOD LEFT HAND Performed at [facility], [place] [address], [place], [place] [address]    Special Requests   Final    BOTTLES DRAWN AEROBIC AND ANAEROBIC Blood Culture adequate volume Performed at [facility] [address] [place], [place] [address]    Culture PENDING  Incomplete   Report Status PENDING  Incomplete  Resp panel by RT-PCR (RSV, Flu A&B, Covid) Anterior Nasal Swab     Status: None   Collection Time: 03/25/22  5:49 PM   Specimen: Anterior Nasal Swab  Result Value Ref Range Status   SARS  Coronavirus 2 by RT PCR NEGATIVE NEGATIVE Final    Comment: (NOTE) SARS-CoV-2 target nucleic acids are NOT DETECTED.  The SARS-CoV-2 RNA is generally detectable in upper respiratory specimens during the acute phase of infection. The lowest concentration of SARS-CoV-2 viral copies this assay can detect is 138 copies/mL. A negative result does not preclude SARS-Cov-2 infection and should not be used as the sole basis for treatment or other patient management decisions. A negative result may occur with  improper specimen collection/handling, submission of specimen other than nasopharyngeal swab, presence of viral mutation(s) within the areas targeted by this assay, and inadequate number of viral copies(<138 copies/mL). A negative result must be combined with clinical observations, patient history, and epidemiological information. The expected result is Negative.  Fact Sheet for Patients:  EntrepreneurPulse.com.au  Fact Sheet for Healthcare Providers:  IncredibleEmployment.be  This test is no t yet approved or cleared by the [place] FDA and  has been authorized for detection and/or diagnosis of SARS-CoV-2 by FDA under an Emergency Use Authorization (EUA). This EUA will remain  in effect (meaning this test can be used) for the duration of the COVID-19 declaration under Section 564(b)(1) of the Act, 21 U.S.C.section 360bbb-3(b)(1), unless the authorization is terminated  or revoked sooner.       Influenza A by PCR NEGATIVE NEGATIVE Final   Influenza B by PCR NEGATIVE NEGATIVE Final    Comment: (NOTE) The Xpert Xpress SARS-CoV-2/FLU/RSV plus assay is intended as an aid in the diagnosis of influenza from Nasopharyngeal swab specimens and should not be used as a sole basis for treatment. Nasal washings and aspirates are unacceptable for Xpert Xpress SARS-CoV-2/FLU/RSV testing.  Fact Sheet for  Patients: EntrepreneurPulse.com.au  Fact Sheet for Healthcare Providers: IncredibleEmployment.be  This test is not yet approved or cleared by the [place] FDA and has been authorized for detection and/or diagnosis of SARS-CoV-2 by FDA under an Emergency Use Authorization (EUA). This EUA will remain in effect (meaning this test can be used) for the duration of the COVID-19 declaration under Section 564(b)(1) of the Act, 21 U.S.C. section 360bbb-3(b)(1), unless the authorization is terminated or revoked.     Resp Syncytial Virus by PCR NEGATIVE NEGATIVE Final    Comment: (  NOTE) Fact Sheet for Patients: EntrepreneurPulse.com.au  Fact Sheet for Healthcare Providers: IncredibleEmployment.be  This test is not yet approved or cleared by the [place] FDA and has been authorized for detection and/or diagnosis of SARS-CoV-2 by FDA under an Emergency Use Authorization (EUA). This EUA will remain in effect (meaning this test can be used) for the duration of the COVID-19 declaration under Section 564(b)(1) of the Act, 21 U.S.C. section 360bbb-3(b)(1), unless the authorization is terminated or revoked.  Performed at [facility], [place] [address], [place], [place] [address]     Antimicrobials: Anti-infectives (From admission, onward)    Start     Dose/Rate Route Frequency Ordered Stop   03/27/22 1000  cefTRIAXone (ROCEPHIN) 2 g in sodium chloride 0.9 % 100 mL IVPB        2 g 200 mL/hr over 30 Minutes Intravenous Every 24 hours 03/26/22 1005 03/30/22 0959   03/26/22 1800  azithromycin (ZITHROMAX) tablet 500 mg        500 mg Oral Daily-1800 03/26/22 1006 03/30/22 1759   03/26/22 1000  cefTRIAXone (ROCEPHIN) 2 g in sodium chloride 0.9 % 100 mL IVPB  Status:  Discontinued        2 g 200 mL/hr over 30 Minutes Intravenous Every 24 hours 03/25/22 1740 03/26/22 1005   03/26/22 1000  azithromycin  (ZITHROMAX) 500 mg in sodium chloride 0.9 % 250 mL IVPB  Status:  Discontinued        500 mg 250 mL/hr over 60 Minutes Intravenous Every 24 hours 03/25/22 1740 03/26/22 1006   03/25/22 1615  cefTRIAXone (ROCEPHIN) 2 g in sodium chloride 0.9 % 100 mL IVPB        2 g 200 mL/hr over 30 Minutes Intravenous  Once 03/25/22 1608 03/25/22 1716   03/25/22 1615  azithromycin (ZITHROMAX) 500 mg in sodium chloride 0.9 % 250 mL IVPB        500 mg 250 mL/hr over 60 Minutes Intravenous  Once 03/25/22 1608 03/25/22 1900      Culture/Microbiology    Component Value Date/Time   SDES  03/25/2022 1650    BLOOD LEFT HAND Performed at [facility], [place] [address] [place], [place] [address]    SPECREQUEST  03/25/2022 1650    BOTTLES DRAWN AEROBIC AND ANAEROBIC Blood Culture adequate volume Performed at [facility], [place] [address] [place], [place] [address]    CULT PENDING 03/25/2022 1650   REPTSTATUS PENDING 03/25/2022 1650    Other culture-see note  Radiology Studies: CT ABDOMEN PELVIS W CONTRAST  Result Date: 03/25/2022 CLINICAL DATA:  RUQ pain EXAM: CT ABDOMEN AND PELVIS WITH CONTRAST TECHNIQUE: Multidetector CT imaging of the abdomen and pelvis was performed using the standard protocol following bolus administration of intravenous contrast. RADIATION DOSE REDUCTION: This exam was performed according to the departmental dose-optimization program which includes automated exposure control, adjustment of the mA and/or kV according to patient size and/or use of iterative reconstruction technique. CONTRAST:  100 mL OMNIPAQUE IOHEXOL 300 MG/ML  SOLN COMPARISON:  04/22/2020 FINDINGS: Lower chest: Alveolar consolidation right lower lobe is consistent with pneumonia. Bibasilar linear subsegmental atelectasis or scarring. Left lower lobe nodules measuring 0.7 cm, 0.6 cm, and 0.6 cm. One of the nodules appears to be new compared to 2022. Small hiatal hernia noted. Hepatobiliary: Postop  changes porta hepatis. Gallbladder is intact. No intrahepatic ductal dilatation identified. There is an area of decreased attenuation measuring 4.1 x 3.3 cm represent a stable finding compared  to the prior study. MRI of the liver may be helpful for follow up of this finding. Pancreas: Unremarkable. No pancreatic ductal dilatation or surrounding inflammatory changes. Spleen: Normal in size without focal abnormality. Adrenals/Urinary Tract: No adrenal lesions. Severe bilateral hydronephrosis and hydroureter. On the left there is a UVJ stone measuring 1.3 cm. On the right the urethral orifice is within a bladder diverticulum. Urinary bladder wall thickening and irregularity noted. Inflammatory or neoplastic processes are possible and could be assessed more definitively cystoscopically. Stomach/Bowel: Stomach is within normal limits. Appendix appears normal. No evidence of bowel wall thickening, distention, or inflammatory changes. Vascular/Lymphatic: Aortic atherosclerosis. No enlarged abdominal or pelvic lymph nodes. Reproductive: Enlarged prostate noted Other: There is evidence of a urachal remnant. No abdominal wall hernia or abnormality. No abdominopelvic ascites. Musculoskeletal: Thoracolumbosacral degenerative changes. IMPRESSION: 1. Right lower lobe alveolar consolidation, likely pneumonia. 2. Small hiatal hernia. 3. Portahepatis mass, approximately 4 cm without definite interval change. 4. Severe bilateral hydronephrosis and hydroureter with a 1.3 cm stone left UVJ and a right-sided bladder diverticulum. 5. Urinary bladder wall thickening consistent with hypertrophy or inflammation. Neoplasm cannot be excluded. 6. Enlarged prostate. Electronically Signed   By: Sammie Bench M.D.   On: 03/25/2022 14:11     LOS: 0 days   Antonieta Pert, MD [facility]  03/26/2022, 10:14 AM

## 2022-03-27 DIAGNOSIS — R413 Other amnesia: Secondary | ICD-10-CM | POA: Diagnosis not present

## 2022-03-27 DIAGNOSIS — R338 Other retention of urine: Secondary | ICD-10-CM

## 2022-03-27 DIAGNOSIS — N1330 Unspecified hydronephrosis: Secondary | ICD-10-CM | POA: Diagnosis not present

## 2022-03-27 DIAGNOSIS — J181 Lobar pneumonia, unspecified organism: Secondary | ICD-10-CM | POA: Diagnosis not present

## 2022-03-27 DIAGNOSIS — E876 Hypokalemia: Secondary | ICD-10-CM | POA: Insufficient documentation

## 2022-03-27 LAB — CBC WITH DIFFERENTIAL/PLATELET
Abs Immature Granulocytes: 0.05 10*3/uL (ref 0.00–0.07)
Basophils Absolute: 0.1 10*3/uL (ref 0.0–0.1)
Basophils Relative: 1 %
Eosinophils Absolute: 0.5 10*3/uL (ref 0.0–0.5)
Eosinophils Relative: 3 %
HCT: 41.1 % (ref 39.0–52.0)
Hemoglobin: 13.5 g/dL (ref 13.0–17.0)
Immature Granulocytes: 0 %
Lymphocytes Relative: 9 %
Lymphs Abs: 1.3 10*3/uL (ref 0.7–4.0)
MCH: 32.1 pg (ref 26.0–34.0)
MCHC: 32.8 g/dL (ref 30.0–36.0)
MCV: 97.9 fL (ref 80.0–100.0)
Monocytes Absolute: 1 10*3/uL (ref 0.1–1.0)
Monocytes Relative: 7 %
Neutro Abs: 12 10*3/uL — ABNORMAL HIGH (ref 1.7–7.7)
Neutrophils Relative %: 80 %
Platelets: 231 10*3/uL (ref 150–400)
RBC: 4.2 MIL/uL — ABNORMAL LOW (ref 4.22–5.81)
RDW: 14 % (ref 11.5–15.5)
WBC: 14.9 10*3/uL — ABNORMAL HIGH (ref 4.0–10.5)
nRBC: 0 % (ref 0.0–0.2)

## 2022-03-27 LAB — BASIC METABOLIC PANEL
Anion gap: 8 (ref 5–15)
BUN: 18 mg/dL (ref 8–23)
CO2: 23 mmol/L (ref 22–32)
Calcium: 8.2 mg/dL — ABNORMAL LOW (ref 8.9–10.3)
Chloride: 108 mmol/L (ref 98–111)
Creatinine, Ser: 1.02 mg/dL (ref 0.61–1.24)
GFR, Estimated: >60 mL/min (ref >60)
Glucose, Bld: 89 mg/dL (ref 70–99)
Potassium: 3.2 mmol/L — ABNORMAL LOW (ref 3.5–5.1)
Sodium: 139 mmol/L (ref 135–145)

## 2022-03-27 MED ORDER — SENNOSIDES-DOCUSATE SODIUM 8.6-50 MG PO TABS
1.0000 | ORAL_TABLET | Freq: Two times a day (BID) | ORAL | Status: DC
  Administered 2022-03-27: 1 via ORAL
  Filled 2022-03-27 (×2): qty 1

## 2022-03-27 MED ORDER — ORAL CARE MOUTH RINSE: 15.0000 mL | OROMUCOSAL | Status: DC | PRN

## 2022-03-27 MED ORDER — GUAIFENESIN ER 600 MG PO TB12
600.0000 mg | ORAL_TABLET | Freq: Two times a day (BID) | ORAL | Status: DC
  Administered 2022-03-27 – 2022-03-29 (×5): 600 mg via ORAL
  Filled 2022-03-27 (×5): qty 1

## 2022-03-27 MED ORDER — CHLORHEXIDINE GLUCONATE CLOTH 2 % EX PADS
6.0000 | MEDICATED_PAD | Freq: Every day | CUTANEOUS | Status: DC
  Administered 2022-03-27 – 2022-03-29 (×2): 6 via TOPICAL

## 2022-03-27 MED ORDER — POTASSIUM CHLORIDE 20 MEQ PO PACK
40.0000 meq | PACK | Freq: Once | ORAL | Status: AC
  Administered 2022-03-27: 40 meq via ORAL
  Filled 2022-03-27: qty 2

## 2022-03-27 MED ORDER — POLYETHYLENE GLYCOL 3350 17 G PO PACK
17.0000 g | PACK | Freq: Every day | ORAL | Status: DC
  Administered 2022-03-27 – 2022-03-28 (×2): 17 g via ORAL
  Filled 2022-03-27 (×2): qty 1

## 2022-03-27 NOTE — Evaluation (Signed)
Physical Therapy Evaluation Patient Details Name: Mason Blackburn MRN: [id number] DOB: 16-Jan-1946 Today's Date: 03/27/2022  History of Present Illness  Patient is a 77 year old male who presented with upper right quadrant abdominal pain and over 1 week no bowel movement. Patient was admitted with right lower lobe pneumonia, UTI with urine retention. PMH: neurogenic bladder, cholangiocarcinoma s/p radiation, anxiety, seizures, cognitive impairment.  Clinical Impression  Pt admitted with above diagnosis.  Pt currently with functional limitations due to the deficits listed below (see PT Problem List). Pt will benefit from skilled PT to increase their independence and safety with mobility to allow discharge to the venue listed below.  Pain in the R LQ was patient's limiting factor with functional mobility. Recommend RW for home use.        Recommendations for follow up therapy are one component of a multi-disciplinary discharge planning process, led by the attending physician.  Recommendations may be updated based on patient status, additional functional criteria and insurance authorization.  Follow Up Recommendations No PT follow up      Assistance Recommended at Discharge Set up Supervision/Assistance  Patient can return home with the following  A little help with walking and/or transfers;A little help with bathing/dressing/bathroom;Assistance with cooking/housework;Assist for transportation    Equipment Recommendations Rolling walker (2 wheels)  Recommendations for Other Services       Functional Status Assessment Patient has had a recent decline in their functional status and demonstrates the ability to make significant improvements in function in a reasonable and predictable amount of time.     Precautions / Restrictions Precautions Precautions: Fall Restrictions Weight Bearing Restrictions: No      Mobility  Bed Mobility Overal bed mobility: Needs Assistance Bed Mobility: Supine  to Sit     Supine to sit: Min guard     General bed mobility comments: R LQ pain with bed mobility.  Attempted to have him do log rolling, but he declined.    Transfers Overall transfer level: Needs assistance Equipment used: Rolling walker (2 wheels) Transfers: Sit to/from Stand Sit to Stand: Min assist           General transfer comment: attempted standing without RW, but unable to stand without UE support.  Grabbing pain with transfer    Ambulation/Gait Ambulation/Gait assistance: Min guard Gait Distance (Feet): 250 Feet Assistive device: Rolling walker (2 wheels) Gait Pattern/deviations: Decreased step length - right, Decreased step length - left, Knee flexed in stance - left, Knee flexed in stance - right       General Gait Details: cues for positioning of RW.  If he took a deep breath, he would stop and it would look like a slight buckle of his hips/knees, but he denied LE weakness, just that the pain would all of a sudden hit him.  Stairs            Wheelchair Mobility    Modified Rankin (Stroke Patients Only)       Balance Overall balance assessment: Mild deficits observed, not formally tested                                           Pertinent Vitals/Pain Pain Assessment Pain Assessment: Faces Faces Pain Scale: Hurts whole lot Pain Location: R LQ with deep breaths and certain movements Pain Descriptors / Indicators: Grimacing, Moaning (grabbing) Pain Intervention(s): Limited activity within patient's tolerance,  Monitored during session, Patient requesting pain meds-RN notified    Home Living Family/patient expects to be discharged to:: Private residence Living Arrangements: Alone Available Help at Discharge: Family;Available PRN/intermittently Type of Home: Apartment Home Access: Level entry       Home Layout: One level Home Equipment: None      Prior Function Prior Level of Function : Independent/Modified  Independent               ADLs Comments: does not drive, daughter in law and girlfriend will assist with driving tasks as needed     Hand Dominance   Dominant Hand: Right    Extremity/Trunk Assessment   Upper Extremity Assessment Upper Extremity Assessment: Defer to OT evaluation    Lower Extremity Assessment Lower Extremity Assessment: Overall WFL for tasks assessed    Cervical / Trunk Assessment Cervical / Trunk Assessment: Normal  Communication   Communication: No difficulties  Cognition Arousal/Alertness: Awake/alert Behavior During Therapy: WFL for tasks assessed/performed Overall Cognitive Status: No family/caregiver present to determine baseline cognitive functioning                                 General Comments: Able to follow all commands, but at times seemed a little bit confused.        General Comments      Exercises     Assessment/Plan    PT Assessment Patient needs continued PT services  PT Problem List Pain;Decreased mobility;Decreased knowledge of use of DME;Decreased balance       PT Treatment Interventions Functional mobility training;Therapeutic activities;Therapeutic exercise;Gait training;Balance training    PT Goals (Current goals can be found in the Care Plan section)  Acute Rehab PT Goals Patient Stated Goal: Stop the pain PT Goal Formulation: With patient Time For Goal Achievement: 04/10/22 Potential to Achieve Goals: Good    Frequency Min 3X/week     Co-evaluation               AM-PAC PT "6 Clicks" Mobility  Outcome Measure Help needed turning from your back to your side while in a flat bed without using bedrails?: A Little Help needed moving from lying on your back to sitting on the side of a flat bed without using bedrails?: A Little Help needed moving to and from a bed to a chair (including a wheelchair)?: A Little Help needed standing up from a chair using your arms (e.g., wheelchair or bedside  chair)?: A Little Help needed to walk in hospital room?: A Little Help needed climbing 3-5 steps with a railing? : A Little 6 Click Score: 18    End of Session Equipment Utilized During Treatment: Gait belt Activity Tolerance: Patient tolerated treatment well;Patient limited by pain Patient left: in bed;with call bell/phone within reach;with bed alarm set;with nursing/sitter in room Nurse Communication: Mobility status;Patient requests pain meds PT Visit Diagnosis: Unsteadiness on feet (R26.81);Difficulty in walking, not elsewhere classified (R26.2);Pain Pain - Right/Left: Right    Time: 2536-6440 PT Time Calculation (min) (ACUTE ONLY): 16 min   Charges:   PT Evaluation $PT Eval Moderate Complexity: 1 Mod          Lydia Guiles., PT Office [phone] Acute Rehab 03/27/2022   Galen Manila 03/27/2022, 4:10 PM

## 2022-03-27 NOTE — Evaluation (Signed)
Occupational Therapy Evaluation Patient Details Name: Mason Blackburn MRN: [id number] DOB: 14-May-1945 Today's Date: 03/27/2022   History of Present Illness Patient is a 77 year old male who presented with upper right quadrant abdominal pain and over 1 week no bowel movement. Patient was admitted with right lower lobe pneumonia, UTI with urine retention. PMH: neurogenic bladder, cholangiocarcinoma s/p radiation, anxiety, seizures, cognitive impairment.   Clinical Impression   Patient is a 77 year old male who was admitted for above. Patient reported living at home alone with PRN support from girlfriend and daughter in law. Patient was noted to have increased pain in R flank, decreased functional activity tolerance, decreased standing balance, increased confusion and decreased safety awareness impacting participation in ADLs. Patient was noted to maintain O2 from 92% to 98% on RA during session. Nurse made aware patient left on RA at end of session. Patient would continue to benefit from skilled OT services at this time while admitted and after d/c to address noted deficits in order to improve overall safety and independence in ADLs.       Recommendations for follow up therapy are one component of a multi-disciplinary discharge planning process, led by the attending physician.  Recommendations may be updated based on patient status, additional functional criteria and insurance authorization.   Follow Up Recommendations  Home health OT (pending level of support at home)     Assistance Recommended at Discharge Frequent or constant Supervision/Assistance  Patient can return home with the following A little help with bathing/dressing/bathroom;Direct supervision/assist for financial management;Help with stairs or ramp for entrance;Assist for transportation;Direct supervision/assist for medications management;Assistance with cooking/housework    Functional Status Assessment  Patient has had a recent  decline in their functional status and demonstrates the ability to make significant improvements in function in a reasonable and predictable amount of time.  Equipment Recommendations  Other (comment) Management consultant)    Recommendations for Other Services       Precautions / Restrictions Precautions Precaution Comments: monitor O2      Mobility Bed Mobility Overal bed mobility: Needs Assistance Bed Mobility: Supine to Sit     Supine to sit: Min guard     General bed mobility comments: wiht increased time and safety cues       Balance Overall balance assessment: Mild deficits observed, not formally tested           ADL either performed or assessed with clinical judgement   ADL Overall ADL's : Needs assistance/impaired Eating/Feeding: Set up;Sitting   Grooming: Brushing hair;Wash/dry face;Wash/dry hands;Set up;Sitting Grooming Details (indicate cue type and reason): in recliner increased pain in side effecting ability to stand for long periods of time. Upper Body Bathing: Set up;Sitting   Lower Body Bathing: Min guard;Sitting/lateral leans;Sit to/from stand;Minimal assistance   Upper Body Dressing : Set up;Sitting   Lower Body Dressing: Min guard;Minimal assistance;Sitting/lateral leans;Sit to/from stand Lower Body Dressing Details (indicate cue type and reason): donning shoes explianing that socks needed to have non skid bottoms to just wear socks. patient demonstrated undertstanding donning shoes. Toilet Transfer: Min guard;Ambulation Toilet Transfer Details (indicate cue type and reason): no AD noted to have increased pain in flank with movement. patient reaching out ot hold onto footboard of bed and door frame. does not endorse having dizziness with movements. Toileting- Clothing Manipulation and Hygiene: Minimal assistance;Sit to/from stand               Vision   Vision Assessment?: No apparent visual deficits  Pertinent Vitals/Pain Pain  Assessment Pain Assessment: No/denies pain     Hand Dominance Right   Extremity/Trunk Assessment Upper Extremity Assessment Upper Extremity Assessment: Overall WFL for tasks assessed   Lower Extremity Assessment Lower Extremity Assessment: Defer to PT evaluation   Cervical / Trunk Assessment Cervical / Trunk Assessment: Normal   Communication Communication Communication: No difficulties   Cognition Arousal/Alertness: Awake/alert Behavior During Therapy: WFL for tasks assessed/performed Overall Cognitive Status: Difficult to assess             General Comments: did not know date, knew he was in the hospital, able to follow one step directions. had confusion over functions of catheter.                Home Living Family/patient expects to be discharged to:: Private residence Living Arrangements: Alone Available Help at Discharge: Family;Available PRN/intermittently Type of Home: Apartment Home Access: Level entry     Home Layout: One level     Bathroom Shower/Tub: Tub/shower unit         Home Equipment: None          Prior Functioning/Environment Prior Level of Function : Independent/Modified Independent               ADLs Comments: does not drive, daughter in law and girlfriend will assist with driving tasks as needed        OT Problem List: Decreased activity tolerance;Impaired balance (sitting and/or standing);Decreased coordination;Pain;Decreased knowledge of use of DME or AE;Decreased knowledge of precautions      OT Treatment/Interventions: Self-care/ADL training;Energy conservation;Therapeutic exercise;DME and/or AE instruction;Patient/family education;Therapeutic activities;Balance training    OT Goals(Current goals can be found in the care plan section) Acute Rehab OT Goals Patient Stated Goal: to get better OT Goal Formulation: Patient unable to participate in goal setting Time For Goal Achievement: 04/10/22 Potential to Achieve  Goals: Fair  OT Frequency: Min 2X/week       AM-PAC OT "6 Clicks" Daily Activity     Outcome Measure Help from another person eating meals?: None Help from another person taking care of personal grooming?: A Little Help from another person toileting, which includes using toliet, bedpan, or urinal?: A Little Help from another person bathing (including washing, rinsing, drying)?: A Little Help from another person to put on and taking off regular upper body clothing?: A Little Help from another person to put on and taking off regular lower body clothing?: A Little 6 Click Score: 19   End of Session Nurse Communication: Other (comment) (ok to participate in session, updated on patients o2 saturation.)  Activity Tolerance: Patient tolerated treatment well Patient left: in chair;with call bell/phone within reach;with chair alarm set  OT Visit Diagnosis: Unsteadiness on feet (R26.81);Other abnormalities of gait and mobility (R26.89);Muscle weakness (generalized) (M62.81);Pain Pain - Right/Left: Right                Time: 1601-0932 OT Time Calculation (min): 21 min Charges:  OT General Charges $OT Visit: 1 Visit OT Evaluation $OT Eval Moderate Complexity: 1 Mod  Ewa Hipp OTR/L, MS Acute Rehabilitation Department Office# [phone]   Willa Rough 03/27/2022, 9:01 AM

## 2022-03-27 NOTE — Progress Notes (Signed)
PROGRESS NOTE    Mason Blackburn  BSW:[id number] DOB: July 06, 1945 DOA: 03/25/2022 PCP: Patient, No Pcp Per     Brief Narrative:   cholangiocarcinoma he S/P radiation followed by Dr. Burr Medico, this has been stable on last visit in February/03/2020, lung nodules, urine retention/prostatic enlargement/neurogenic bladder previously with suprapubic catheter which he removed himself and has been doing intermittent self-catheterization, seizure disorder/cognitive difficulties presented with right upper quadrant abdominal pain and having small voids x 1 week no BM in over a week.  He does have chronic abdominal pain not significantly changed.   GY:KZLDJ lower lobe alveolar consolidation, likely pneumonia. Portahepatis mass, approximately 4 cm without definite interval change. Severe bilateral hydronephrosis and hydroureter with a 1.3 cm stone left UVJ and a right-sided bladder diverticulum. Urinary bladder wall thickening consistent with hypertrophy or inflammation. Neoplasm cannot be excluded. Enlarged prostate" Urology was consulted advised Foley catheter placement and outpatient follow-up.   Subjective:  WBC coming down, urine culture in process, he is on room air, no fever He is sitting up in chair, reports right side pain when taking deep breath, some dry cough, on 2liter oxygen , denies sob, no edema Reports no bm for several days  Clear urine in Foley He is not oriented to time, he knows he is in the hospital but not able to provide the name  Assessment & Plan:  Principal Problem:   Pneumonia Active Problems:   Cholangiocarcinoma ([place])   ANXIETY DISORDER, GENERALIZED   Neurogenic bladder   Shortness of breath    Assessment and Plan:  RLL pneumonia with leukocytosis: Clinically seems to be improving continue with current empiric antibiotics rocephin and zithro, leukocytosis improving, respiratory status stable. Blood culture no growth, cough does not appear to be productive, sputum was not  collected, urine strep pneumo negative, urine Legionella antigen pending  NM VQ scan pending but at this time low suspicion for PE  -suspect right side flank pain /RUQ pain is from PNA, nontender, does reports pain with deep breath   UTI w/ Urine retention/prostatic enlargement/neurogenic bladder previously with suprapubic catheter which was removed and has been voiding well> followed by urology plan is to current catheter in place and outpatient follow-up> see the CT scan report currently no plan for inpatient procedure.   Continue antibiotics follow-up urine culture.    Hypokalemia, replace K, check mag  Cholangiocarcinoma history S/P radiation followed by oncology as outpatient. CT showed "Postop changes porta hepatis. Gallbladder is intact. No intrahepatic ductal dilatation identified. There is an area of decreased attenuation measuring 4.1 x 3.3 cm represent a stable finding compared to the prior study"  Seen by oncologist Dr Burr Medico  who recommended outpatient follow up in three months   Constipation Start Senokot and MiraLAX   Anxiety disorder: Mood stable resume home meds  Seizure disorder: Resume home meds-Trileptal Cognitive impairment: Supportive care, lives by himself, reports has good family supports      I have Reviewed nursing notes, Vitals, pain scores, I/o's, Lab results and  imaging results since pt's last encounter, details please see discussion above  I ordered the following labs:  Unresulted Labs (From admission, onward)     Start     Ordered   04/01/22 0500  Creatinine, serum  (enoxaparin (LOVENOX)    CrCl >/= 30 ml/min)  Weekly,   R     Comments: while on enoxaparin therapy    03/25/22 1740   03/28/22 5701  Basic metabolic panel  Tomorrow morning,   R  Question:  Specimen collection method  Answer:  Lab=Lab collect   03/27/22 0843   03/28/22 0500  Magnesium  Tomorrow morning,   R       Question:  Specimen collection method  Answer:  Lab=Lab collect    03/27/22 0843   03/28/22 0500  Phosphorus  Tomorrow morning,   R       Question:  Specimen collection method  Answer:  Lab=Lab collect   03/27/22 0843   03/26/22 1019  Urine Culture  (Urine Culture)  Add-on,   AD       Question:  Indication  Answer:  Dysuria   03/26/22 1018   03/26/22 0500  CBC with Differential/Platelet  Daily,   R      03/25/22 1740   03/25/22 1739  Legionella Pneumophila Serogp 1 Ur Ag  (COPD / Pneumonia / Cellulitis / Lower Extremity Wound)  Once,   R        03/25/22 1740   03/25/22 1739  Expectorated Sputum Assessment w Gram Stain, Rflx to Resp Cult  (COPD / Pneumonia / Cellulitis / Lower Extremity Wound)  Once,   R        03/25/22 1740             DVT prophylaxis: enoxaparin (LOVENOX) injection 40 mg Start: 03/25/22 2200   Code Status:   Code Status: Full Code  Family Communication: none at bedside  Disposition:   Dispo: The patient is from: home, reports lives by himself, reports moved from [place] to [place] two years ago              Anticipated d/c is to: Likely will need home health              Anticipated d/c date is: Awaiting for urine culture  Antimicrobials:   Anti-infectives (From admission, onward)    Start     Dose/Rate Route Frequency Ordered Stop   03/27/22 1000  cefTRIAXone (ROCEPHIN) 2 g in sodium chloride 0.9 % 100 mL IVPB        2 g 200 mL/hr over 30 Minutes Intravenous Every 24 hours 03/26/22 1005 03/30/22 0959   03/26/22 1800  azithromycin (ZITHROMAX) tablet 500 mg        500 mg Oral Daily-1800 03/26/22 1006 03/30/22 1759   03/26/22 1000  cefTRIAXone (ROCEPHIN) 2 g in sodium chloride 0.9 % 100 mL IVPB  Status:  Discontinued        2 g 200 mL/hr over 30 Minutes Intravenous Every 24 hours 03/25/22 1740 03/26/22 1005   03/26/22 1000  azithromycin (ZITHROMAX) 500 mg in sodium chloride 0.9 % 250 mL IVPB  Status:  Discontinued        500 mg 250 mL/hr over 60 Minutes Intravenous Every 24 hours 03/25/22 1740 03/26/22 1006    03/25/22 1615  cefTRIAXone (ROCEPHIN) 2 g in sodium chloride 0.9 % 100 mL IVPB        2 g 200 mL/hr over 30 Minutes Intravenous  Once 03/25/22 1608 03/25/22 1716   03/25/22 1615  azithromycin (ZITHROMAX) 500 mg in sodium chloride 0.9 % 250 mL IVPB        500 mg 250 mL/hr over 60 Minutes Intravenous  Once 03/25/22 1608 03/25/22 1900           Objective: Vitals:   03/26/22 0916 03/26/22 1336 03/26/22 2122 03/27/22 0532  BP: 113/85 (!) 120/57 (!) 143/86 126/78  Pulse: 96 88 81 75  Resp: '16 18 14 '$ 14  Temp: 98.6 F (37 C) 98 F (36.7 C) 98.2 F (36.8 C) 98.9 F (37.2 C)  TempSrc: Oral Oral Oral Oral  SpO2: 95% 97% 94% 95%  Weight:      Height:        Intake/Output Summary (Last 24 hours) at 03/27/2022 1149 Last data filed at 03/27/2022 0919 Gross per 24 hour  Intake 1961.09 ml  Output 1500 ml  Net 461.09 ml   Filed Weights   03/25/22 1024 03/26/22 0125  Weight: 61.2 kg 65.9 kg    Examination:  General exam: frail elderly sitting up in chair, appear weak but NAD Respiratory system: Clear to auscultation. Respiratory effort normal. Cardiovascular system:  RRR.  Gastrointestinal system: Abdomen is nondistended, soft and nontender.  Normal bowel sounds heard. Central nervous system: Alert and oriented to person, know he is in the hospital, poor memory  Extremities:  no edema Skin: No rashes, lesions or ulcers Psychiatry: calm and cooperative    Data Reviewed: I have personally reviewed  labs and visualized  imaging studies since the last encounter and formulate the plan        Scheduled Meds:  azithromycin  500 mg Oral q1800   Chlorhexidine Gluconate Cloth  6 each Topical Daily   enoxaparin (LOVENOX) injection  40 mg Subcutaneous Q24H   guaiFENesin  600 mg Oral BID   OXcarbazepine  300 mg Oral Daily   OXcarbazepine  600 mg Oral QHS   polyethylene glycol  17 g Oral Daily   senna-docusate  1 tablet Oral BID   Continuous Infusions:  sodium chloride 100 mL/hr  at 03/27/22 0144   cefTRIAXone (ROCEPHIN)  IV 2 g (03/27/22 1012)     LOS: 1 day   Time spent: 53mns  FFlorencia Reasons MD PhD FACP [facility]  Available via Epic secure chat 7am-7pm for nonurgent issues Please page for urgent issues To page the attending provider between 7A-7P or the covering provider during after hours 7P-7A, please log into the web site [email or web address] and access using universal [place] password for that web site. If you do not have the password, please call the hospital operator.    03/27/2022, 11:49 AM

## 2022-03-28 DIAGNOSIS — N1330 Unspecified hydronephrosis: Secondary | ICD-10-CM | POA: Diagnosis not present

## 2022-03-28 DIAGNOSIS — R413 Other amnesia: Secondary | ICD-10-CM | POA: Diagnosis not present

## 2022-03-28 DIAGNOSIS — J181 Lobar pneumonia, unspecified organism: Secondary | ICD-10-CM | POA: Diagnosis not present

## 2022-03-28 DIAGNOSIS — R338 Other retention of urine: Secondary | ICD-10-CM | POA: Diagnosis not present

## 2022-03-28 LAB — CBC WITH DIFFERENTIAL/PLATELET
Abs Immature Granulocytes: 0.07 10*3/uL (ref 0.00–0.07)
Basophils Absolute: 0.1 10*3/uL (ref 0.0–0.1)
Basophils Relative: 1 %
Eosinophils Absolute: 0.7 10*3/uL — ABNORMAL HIGH (ref 0.0–0.5)
Eosinophils Relative: 6 %
HCT: 40.1 % (ref 39.0–52.0)
Hemoglobin: 13.3 g/dL (ref 13.0–17.0)
Immature Granulocytes: 1 %
Lymphocytes Relative: 13 %
Lymphs Abs: 1.4 10*3/uL (ref 0.7–4.0)
MCH: 31.7 pg (ref 26.0–34.0)
MCHC: 33.2 g/dL (ref 30.0–36.0)
MCV: 95.7 fL (ref 80.0–100.0)
Monocytes Absolute: 0.9 10*3/uL (ref 0.1–1.0)
Monocytes Relative: 8 %
Neutro Abs: 8 10*3/uL — ABNORMAL HIGH (ref 1.7–7.7)
Neutrophils Relative %: 71 %
Platelets: 245 10*3/uL (ref 150–400)
RBC: 4.19 MIL/uL — ABNORMAL LOW (ref 4.22–5.81)
RDW: 13.5 % (ref 11.5–15.5)
WBC: 11.1 10*3/uL — ABNORMAL HIGH (ref 4.0–10.5)
nRBC: 0 % (ref 0.0–0.2)

## 2022-03-28 LAB — URINE CULTURE: Culture: >=100000 — AB

## 2022-03-28 LAB — BASIC METABOLIC PANEL
Anion gap: 9 (ref 5–15)
BUN: 19 mg/dL (ref 8–23)
CO2: 21 mmol/L — ABNORMAL LOW (ref 22–32)
Calcium: 8.2 mg/dL — ABNORMAL LOW (ref 8.9–10.3)
Chloride: 107 mmol/L (ref 98–111)
Creatinine, Ser: 0.91 mg/dL (ref 0.61–1.24)
GFR, Estimated: >60 mL/min (ref >60)
Glucose, Bld: 91 mg/dL (ref 70–99)
Potassium: 3.1 mmol/L — ABNORMAL LOW (ref 3.5–5.1)
Sodium: 137 mmol/L (ref 135–145)

## 2022-03-28 LAB — PHOSPHORUS: Phosphorus: 2.4 mg/dL — ABNORMAL LOW (ref 2.5–4.6)

## 2022-03-28 LAB — MAGNESIUM: Magnesium: 1.7 mg/dL (ref 1.7–2.4)

## 2022-03-28 MED ORDER — K PHOS MONO-SOD PHOS DI & MONO 155-852-130 MG PO TABS
250.0000 mg | ORAL_TABLET | Freq: Two times a day (BID) | ORAL | Status: AC
  Administered 2022-03-28 (×2): 250 mg via ORAL
  Filled 2022-03-28 (×2): qty 1

## 2022-03-28 MED ORDER — SENNOSIDES-DOCUSATE SODIUM 8.6-50 MG PO TABS: 1.0000 | ORAL_TABLET | Freq: Every day | ORAL | Status: DC

## 2022-03-28 MED ORDER — MAGNESIUM SULFATE 2 GM/50ML IV SOLN
2.0000 g | Freq: Once | INTRAVENOUS | Status: AC
  Administered 2022-03-28: 2 g via INTRAVENOUS
  Filled 2022-03-28: qty 50

## 2022-03-28 MED ORDER — POTASSIUM CHLORIDE CRYS ER 20 MEQ PO TBCR
40.0000 meq | EXTENDED_RELEASE_TABLET | Freq: Every day | ORAL | Status: AC
  Administered 2022-03-28 – 2022-03-29 (×2): 40 meq via ORAL
  Filled 2022-03-28 (×2): qty 2

## 2022-03-28 MED ORDER — GERHARDT'S BUTT CREAM
TOPICAL_CREAM | Freq: Two times a day (BID) | CUTANEOUS | Status: AC
  Filled 2022-03-28 (×2): qty 1

## 2022-03-28 NOTE — Progress Notes (Addendum)
PROGRESS NOTE    Mason Blackburn  WUJ:[id number] DOB: April 20, 1945 DOA: 03/25/2022 PCP: Patient, No Pcp Per     Brief Narrative:   cholangiocarcinoma he S/P radiation followed by Dr. Burr Medico, this has been stable on last visit in February/03/2020, lung nodules, urine retention/prostatic enlargement/neurogenic bladder previously with suprapubic catheter which he removed himself and has been doing intermittent self-catheterization, seizure disorder/cognitive difficulties presented with right upper quadrant abdominal pain and having small voids x 1 week no BM in over a week.  He does have chronic abdominal pain not significantly changed.   NF:AOZHY lower lobe alveolar consolidation, likely pneumonia. Portahepatis mass, approximately 4 cm without definite interval change. Severe bilateral hydronephrosis and hydroureter with a 1.3 cm stone left UVJ and a right-sided bladder diverticulum. Urinary bladder wall thickening consistent with hypertrophy or inflammation. Neoplasm cannot be excluded. Enlarged prostate" Urology was consulted advised Foley catheter placement and outpatient follow-up.   Subjective:  WBC coming down, urine culture in process, he is on room air, no fever Having bm, reports right side pain has resolved   Clear urine in Foley He appears is less confused, he knows it is  January 2024 today , he knows he is in the hospital but not able to provide the name  Assessment & Plan:  Principal Problem:   Pneumonia Active Problems:   Cholangiocarcinoma ([place])   ANXIETY DISORDER, GENERALIZED   Neurogenic bladder   Shortness of breath   Bilateral hydronephrosis   Hypokalemia    Assessment and Plan:  RLL pneumonia with leukocytosis: Clinically seems to be improving continue with current empiric antibiotics rocephin and zithro, leukocytosis improving, respiratory status stable. Blood culture no growth, cough does not appear to be productive, sputum was not collected, urine strep pneumo  negative, urine Legionella antigen pending  NM VQ scan pending but at this time low suspicion for PE  -suspect right side flank pain /RUQ pain is from PNA, nontender, does reports pain with deep breath   UTI w/ Urine retention/prostatic enlargement/neurogenic bladder previously with suprapubic catheter which was removed and has been voiding well> followed by urology plan is to current catheter in place and outpatient follow-up> see the CT scan report currently no plan for inpatient procedure.   urine culture + ecoli, final result pending    Hypokalemia/hypophosphatemia/hypomagnesemia, replace, recheck in the morning  Cholangiocarcinoma history S/P radiation followed by oncology as outpatient. CT showed "Postop changes porta hepatis. Gallbladder is intact. No intrahepatic ductal dilatation identified. There is an area of decreased attenuation measuring 4.1 x 3.3 cm represent a stable finding compared to the prior study"  Seen by oncologist Dr Burr Medico  who recommended outpatient follow up in three months   Constipation Start Senokot and MiraLAX, had BM   Anxiety disorder: Mood stable resume home meds  Seizure disorder: Resume home meds-Trileptal, f/u with [facility] neurologist Cognitive impairment: Supportive care, lives by himself, reports has good family supports      I have Reviewed nursing notes, Vitals, pain scores, I/o's, Lab results and  imaging results since pt's last encounter, details please see discussion above  I ordered the following labs:  Unresulted Labs (From admission, onward)     Start     Ordered   04/01/22 0500  Creatinine, serum  (enoxaparin (LOVENOX)    CrCl >/= 30 ml/min)  Weekly,   R     Comments: while on enoxaparin therapy    03/25/22 1740   03/29/22 8657  Basic metabolic panel  Tomorrow morning,   R  Question:  Specimen collection method  Answer:  Lab=Lab collect   03/28/22 0900   03/29/22 0500  Magnesium  Tomorrow morning,   R       Question:   Specimen collection method  Answer:  Lab=Lab collect   03/28/22 0900   03/29/22 0500  Phosphorus  Tomorrow morning,   R       Question:  Specimen collection method  Answer:  Lab=Lab collect   03/28/22 0900   03/26/22 0500  CBC with Differential/Platelet  Daily,   R      03/25/22 1740   03/25/22 1739  Legionella Pneumophila Serogp 1 Ur Ag  (COPD / Pneumonia / Cellulitis / Lower Extremity Wound)  Once,   R        03/25/22 1740   03/25/22 1739  Expectorated Sputum Assessment w Gram Stain, Rflx to Resp Cult  (COPD / Pneumonia / Cellulitis / Lower Extremity Wound)  Once,   R        03/25/22 1740             DVT prophylaxis: enoxaparin (LOVENOX) injection 40 mg Start: 03/25/22 2200   Code Status:   Code Status: Full Code  Family Communication: son at bedside  Disposition:   Dispo: The patient is from: home, reports lives by himself, reports moved from [place] to [place] two years ago              Anticipated d/c is to: did well with PT, OT recommended home health, with cognitive impairment, lives by himself, I think patient will benefit from home health, order placed, son reports he lives by himself but with good family spport              Anticipated d/c date is: Awaiting for urine culture, likely d/c on 1/8  Antimicrobials:   Anti-infectives (From admission, onward)    Start     Dose/Rate Route Frequency Ordered Stop   03/27/22 1000  cefTRIAXone (ROCEPHIN) 2 g in sodium chloride 0.9 % 100 mL IVPB        2 g 200 mL/hr over 30 Minutes Intravenous Every 24 hours 03/26/22 1005 03/30/22 0959   03/26/22 1800  azithromycin (ZITHROMAX) tablet 500 mg        500 mg Oral Daily-1800 03/26/22 1006 03/30/22 1759   03/26/22 1000  cefTRIAXone (ROCEPHIN) 2 g in sodium chloride 0.9 % 100 mL IVPB  Status:  Discontinued        2 g 200 mL/hr over 30 Minutes Intravenous Every 24 hours 03/25/22 1740 03/26/22 1005   03/26/22 1000  azithromycin (ZITHROMAX) 500 mg in sodium chloride 0.9 % 250 mL  IVPB  Status:  Discontinued        500 mg 250 mL/hr over 60 Minutes Intravenous Every 24 hours 03/25/22 1740 03/26/22 1006   03/25/22 1615  cefTRIAXone (ROCEPHIN) 2 g in sodium chloride 0.9 % 100 mL IVPB        2 g 200 mL/hr over 30 Minutes Intravenous  Once 03/25/22 1608 03/25/22 1716   03/25/22 1615  azithromycin (ZITHROMAX) 500 mg in sodium chloride 0.9 % 250 mL IVPB        500 mg 250 mL/hr over 60 Minutes Intravenous  Once 03/25/22 1608 03/25/22 1900           Objective: Vitals:   03/26/22 2122 03/27/22 0532 03/27/22 1423 03/27/22 2031  BP: (!) 143/86 126/78 130/78 129/77  Pulse: 81 75 73 78  Resp: 14 14 14  16  Temp: 98.2 F (36.8 C) 98.9 F (37.2 C) 98 F (36.7 C) 98.3 F (36.8 C)  TempSrc: Oral Oral Oral Oral  SpO2: 94% 95% 95% 96%  Weight:      Height:        Intake/Output Summary (Last 24 hours) at 03/28/2022 0901 Last data filed at 03/28/2022 0840 Gross per 24 hour  Intake 380.58 ml  Output 1775 ml  Net -1394.42 ml   Filed Weights   03/25/22 1024 03/26/22 0125  Weight: 61.2 kg 65.9 kg    Examination:  General exam: frail elderly sitting up in chair, appear weak but NAD Respiratory system: Clear to auscultation. Respiratory effort normal. Cardiovascular system:  RRR.  Gastrointestinal system: Abdomen is nondistended, soft and nontender.  Normal bowel sounds heard. Central nervous system: Alert and oriented to person, know he is in the hospital, poor memory  Extremities:  no edema Skin: No rashes, lesions or ulcers Psychiatry: calm and cooperative    Data Reviewed: I have personally reviewed  labs and visualized  imaging studies since the last encounter and formulate the plan        Scheduled Meds:  azithromycin  500 mg Oral q1800   Chlorhexidine Gluconate Cloth  6 each Topical Daily   enoxaparin (LOVENOX) injection  40 mg Subcutaneous Q24H   guaiFENesin  600 mg Oral BID   OXcarbazepine  300 mg Oral Daily   OXcarbazepine  600 mg Oral QHS    phosphorus  250 mg Oral BID   polyethylene glycol  17 g Oral Daily   potassium chloride  40 mEq Oral Daily   senna-docusate  1 tablet Oral BID   Continuous Infusions:  cefTRIAXone (ROCEPHIN)  IV Stopped (03/27/22 1042)   magnesium sulfate bolus IVPB       LOS: 2 days   Time spent: 61mns  FFlorencia Reasons MD PhD FACP [facility]  Available via Epic secure chat 7am-7pm for nonurgent issues Please page for urgent issues To page the attending provider between 7A-7P or the covering provider during after hours 7P-7A, please log into the web site [email or web address] and access using universal [place] password for that web site. If you do not have the password, please call the hospital operator.    03/28/2022, 9:01 AM

## 2022-03-29 DIAGNOSIS — J181 Lobar pneumonia, unspecified organism: Secondary | ICD-10-CM | POA: Diagnosis not present

## 2022-03-29 DIAGNOSIS — N390 Urinary tract infection, site not specified: Secondary | ICD-10-CM

## 2022-03-29 DIAGNOSIS — R338 Other retention of urine: Secondary | ICD-10-CM | POA: Diagnosis not present

## 2022-03-29 DIAGNOSIS — B9620 Unspecified Escherichia coli [E. coli] as the cause of diseases classified elsewhere: Secondary | ICD-10-CM

## 2022-03-29 DIAGNOSIS — R413 Other amnesia: Secondary | ICD-10-CM | POA: Diagnosis not present

## 2022-03-29 LAB — BASIC METABOLIC PANEL
Anion gap: 8 (ref 5–15)
BUN: 17 mg/dL (ref 8–23)
CO2: 23 mmol/L (ref 22–32)
Calcium: 8.1 mg/dL — ABNORMAL LOW (ref 8.9–10.3)
Chloride: 109 mmol/L (ref 98–111)
Creatinine, Ser: 1 mg/dL (ref 0.61–1.24)
GFR, Estimated: >60 mL/min (ref >60)
Glucose, Bld: 91 mg/dL (ref 70–99)
Potassium: 3.5 mmol/L (ref 3.5–5.1)
Sodium: 140 mmol/L (ref 135–145)

## 2022-03-29 LAB — CBC WITH DIFFERENTIAL/PLATELET
Abs Immature Granulocytes: 0.03 10*3/uL (ref 0.00–0.07)
Basophils Absolute: 0.1 10*3/uL (ref 0.0–0.1)
Basophils Relative: 1 %
Eosinophils Absolute: 0.6 10*3/uL — ABNORMAL HIGH (ref 0.0–0.5)
Eosinophils Relative: 5 %
HCT: 39.5 % (ref 39.0–52.0)
Hemoglobin: 13.3 g/dL (ref 13.0–17.0)
Immature Granulocytes: 0 %
Lymphocytes Relative: 13 %
Lymphs Abs: 1.4 10*3/uL (ref 0.7–4.0)
MCH: 32.1 pg (ref 26.0–34.0)
MCHC: 33.7 g/dL (ref 30.0–36.0)
MCV: 95.4 fL (ref 80.0–100.0)
Monocytes Absolute: 1.1 10*3/uL — ABNORMAL HIGH (ref 0.1–1.0)
Monocytes Relative: 10 %
Neutro Abs: 7.7 10*3/uL (ref 1.7–7.7)
Neutrophils Relative %: 71 %
Platelets: 255 10*3/uL (ref 150–400)
RBC: 4.14 MIL/uL — ABNORMAL LOW (ref 4.22–5.81)
RDW: 13.6 % (ref 11.5–15.5)
WBC: 10.9 10*3/uL — ABNORMAL HIGH (ref 4.0–10.5)
nRBC: 0 % (ref 0.0–0.2)

## 2022-03-29 LAB — PHOSPHORUS: Phosphorus: 3.3 mg/dL (ref 2.5–4.6)

## 2022-03-29 LAB — MAGNESIUM: Magnesium: 1.8 mg/dL (ref 1.7–2.4)

## 2022-03-29 MED ORDER — AMOXICILLIN-POT CLAVULANATE 875-125 MG PO TABS: 1.0000 | ORAL_TABLET | Freq: Two times a day (BID) | ORAL | 0 refills | Status: AC

## 2022-03-29 MED ORDER — MAGNESIUM SULFATE 2 GM/50ML IV SOLN
2.0000 g | Freq: Once | INTRAVENOUS | Status: AC
  Administered 2022-03-29: 2 g via INTRAVENOUS
  Filled 2022-03-29: qty 50

## 2022-03-29 MED ORDER — GUAIFENESIN ER 600 MG PO TB12: 600.0000 mg | ORAL_TABLET | Freq: Two times a day (BID) | ORAL | Status: DC

## 2022-03-29 MED ORDER — SACCHAROMYCES BOULARDII 250 MG PO CAPS
250.0000 mg | ORAL_CAPSULE | Freq: Two times a day (BID) | ORAL | Status: DC
  Administered 2022-03-29: 250 mg via ORAL
  Filled 2022-03-29: qty 1

## 2022-03-29 MED ORDER — SACCHAROMYCES BOULARDII 250 MG PO CAPS: 250.0000 mg | ORAL_CAPSULE | Freq: Two times a day (BID) | ORAL | 0 refills | Status: AC

## 2022-03-29 NOTE — TOC Progression Note (Signed)
Transition of Care [facility]) - Progression Note    Patient Details  Name: Mason Blackburn MRN: [id number] Date of Birth: 1945/11/15  Transition of Care [facility]) CM/SW Amanda, RN Phone Number:[phone]  03/29/2022, 1:06 PM  Clinical Narrative:    [facility] acknowledges consult for [place] / DME Needs . DME rolling walker ordered and delivered to bedside per [employer]. CM at bedside to offer choice for home health services. Patient has no preference. Home health referral has been accepted by Hoyle Sauer with [place] home [facility]. AVS has been updated. No other TOC needs noted at this time.         Expected Discharge Plan and Services                                               Social Determinants of Health (SDOH) Interventions SDOH Screenings   Tobacco Use: High Risk (03/25/2022)    Readmission Risk Interventions     No data to display

## 2022-03-29 NOTE — Progress Notes (Signed)
Physical Therapy Treatment Patient Details Name: Mason Blackburn MRN: [id number] DOB: December 11, 1945 Today's Date: 03/29/2022   History of Present Illness Patient is a 77 year old male who presented with upper right quadrant abdominal pain and over 1 week no bowel movement. Patient was admitted with right lower lobe pneumonia, UTI with urine retention. PMH: neurogenic bladder, cholangiocarcinoma s/p radiation, anxiety, seizures, cognitive impairment.    PT Comments    Pt received supine in bed reporting no pain and agreeable to mobilize. Pt demonstrated modified independence for bed mobility, supervision for transfers, and min guard for ambulation in hallway 510f, no overt LOB noted or physical assist required. Encouraged additional ambulation attempts today as able, pt verbalized understanding, mobility specialist notified and aware. Demonstrated and described general BLE exercises and encouraged regular completion as pt continues to improve, pt verbalized understanding. Pt verbalized that family is nearby and can provide setup supervision/assist and that he is comfortable going home at his current functional level. Pt has met acute therapy mobility goals, signing off. Should needs change, please reconsult. See below for discharge destination and equipment recommendations. Thank you for this referral.  Recommendations for follow up therapy are one component of a multi-disciplinary discharge planning process, led by the attending physician.  Recommendations may be updated based on patient status, additional functional criteria and insurance authorization.  Follow Up Recommendations  No PT follow up     Assistance Recommended at Discharge Set up Supervision/Assistance  Patient can return home with the following A little help with walking and/or transfers;A little help with bathing/dressing/bathroom;Assistance with cooking/housework;Assist for transportation   Equipment Recommendations  Rolling walker (2  wheels)    Recommendations for Other Services       Precautions / Restrictions Precautions Precautions: Fall Restrictions Weight Bearing Restrictions: No     Mobility  Bed Mobility Overal bed mobility: Modified Independent Bed Mobility: Supine to Sit     Supine to sit: Modified independent (Device/Increase time)     General bed mobility comments: Increased time. Encouraged logrolling but pt declined.    Transfers Overall transfer level: Needs assistance Equipment used: None Transfers: Sit to/from Stand Sit to Stand: Supervision           General transfer comment: For safety only, no physical assist required.    Ambulation/Gait Ambulation/Gait assistance: Min guard Gait Distance (Feet): 500 Feet Assistive device: IV Pole, None Gait Pattern/deviations: Decreased step length - right, Decreased step length - left, Knee flexed in stance - left, Knee flexed in stance - right Gait velocity: decreased     General Gait Details: Pt ambulated 507fwith single UE on IV pole for majority of ambulation task; had pt ambulate with no AD for ~5045fpt with decreased arm swing bilaterally and highly ERed feet, RLE with reduced DF.   Stairs             Wheelchair Mobility    Modified Rankin (Stroke Patients Only)       Balance Overall balance assessment: Mild deficits observed, not formally tested                                          Cognition Arousal/Alertness: Awake/alert Behavior During Therapy: WFL for tasks assessed/performed Overall Cognitive Status: No family/caregiver present to determine baseline cognitive functioning  Exercises Other Exercises Other Exercises: Demonstrated and described various BLE strengthening exercises including sit to stand, LAQs, and hip ABD/ADD.    General Comments        Pertinent Vitals/Pain Pain Assessment Pain Assessment: No/denies  pain Pain Descriptors / Indicators:  (grabbing)    Home Living                          Prior Function            PT Goals (current goals can now be found in the care plan section) Acute Rehab PT Goals Patient Stated Goal: Stop the pain PT Goal Formulation: With patient Time For Goal Achievement: 04/10/22 Potential to Achieve Goals: Good Progress towards PT goals: Goals met/education completed, patient discharged from PT    Frequency    Min 3X/week      PT Plan Current plan remains appropriate    Co-evaluation              AM-PAC PT "6 Clicks" Mobility   Outcome Measure  Help needed turning from your back to your side while in a flat bed without using bedrails?: None Help needed moving from lying on your back to sitting on the side of a flat bed without using bedrails?: None Help needed moving to and from a bed to a chair (including a wheelchair)?: A Little Help needed standing up from a chair using your arms (e.g., wheelchair or bedside chair)?: A Little Help needed to walk in hospital room?: A Little Help needed climbing 3-5 steps with a railing? : A Little 6 Click Score: 20    End of Session   Activity Tolerance: Patient tolerated treatment well;No increased pain Patient left: with call bell/phone within reach;in chair;with chair alarm set Nurse Communication: Mobility status;Patient requests pain meds PT Visit Diagnosis: Unsteadiness on feet (R26.81);Difficulty in walking, not elsewhere classified (R26.2);Pain Pain - Right/Left: Right     Time: 8657-8469 PT Time Calculation (min) (ACUTE ONLY): 10 min  Charges:  $Gait Training: 8-22 mins                     Coolidge Breeze, PT, DPT [facility] Department Office: [phone] Weekend pager: [phone]   Coolidge Breeze 03/29/2022, 10:47 AM

## 2022-03-29 NOTE — Discharge Summary (Signed)
Discharge Summary  Mason Blackburn JHE:[id number] DOB: [birth year]  PCP: Patient, No Pcp Per  Admit date: 03/25/2022 Discharge date: 03/29/2022    Time spent: 36mns, more than 50% time spent on coordination of care.   Recommendations for Outpatient Follow-up:  F/u with PCP within a week  for hospital discharge follow up, repeat cbc/bmp at follow up F/[facility] with [facility] for urinary retention, foley management F/u with neurology for memory impairment  F/u with oncology  Home health ordered     Discharge Diagnoses:  Active Hospital Problems   Diagnosis Date Noted   Pneumonia 03/25/2022   [place] ([place] 09/04/2019    Priority: High   Bilateral hydronephrosis 03/27/2022   Hypokalemia 03/27/2022   Shortness of breath 03/26/2022   Neurogenic bladder 03/25/2022   ANXIETY DISORDER, GENERALIZED 06/02/2006    Resolved Hospital Problems  No resolved problems to display.    Discharge Condition: stable  Diet recommendation: regular diet   Filed Weights   03/25/22 1024 03/26/22 0125  Weight: 61.2 kg 65.9 kg    History of present illness: ( per admitting MD Mason Mason Blackburn HPI: HDagmawi Venableis a 77y.o. 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 FBurr Blackburn this has been stable on last visit in February/03/2020, lung nodules, urine retention/prostatic enlargement/neurogenic bladder previously with suprapubic catheter which he removed himself and has been doing intermittent self-catheterization, seizure disorder/cognitive difficulties presented with right upper quadrant abdominal pain and having small voids x 1 week no BM in over a week.  He does have chronic abdominal pain not significantly changed.   ED Course: Mildly tachycardic up to 117 vitals stable, SpO2 stable on room air.  Labs showed leukocytosis 22.7 K, abnormal UA cloudy, WBC more than 50, LE large nitrate negative. Underwent CT abdomen pelvis "Labs: wbc 22k, bmp w/ k 5.0. UA:  wbc>50, LE Large CJE:HUDJSlower lobe alveolar consolidation, likely pneumonia. Portahepatis mass, approximately 4 cm without definite interval change. Severe bilateral hydronephrosis and hydroureter with a 1.3 cm stone left UVJ and a right-sided bladder diverticulum. Urinary bladder wall thickening consistent with hypertrophy or inflammation. Neoplasm cannot be excluded. Enlarged prostate" Urology was consulted advised Foley catheter placement and outpatient follow-up. Patient was appearing uncomfortable having right lower chest pain, due to concern for PE due to patient's tachycardia VQ scan ordered.  Placed on IV antibiotics and admission requested      Hospital Course:  Principal Problem:   Pneumonia Active Problems:   [place]olangiocarcinoma ([facility]   ANXIETY DISORDER, GENERALIZED   Neurogenic bladder   Shortness of breath   Bilateral hydronephrosis   Hypokalemia   Assessment and Plan:  RLL pneumonia :  -NM VQ scan pending but at this time low suspicion for PE - Blood culture no growth,  urine strep pneumo negative, urine Legionella antigen pending  -Received rocephin and zithro,  improved, wbc from 22.7 to 10.9 , respiratory status stable, right sided flank pain resolved , suspect pleuritic pain from RLL pneumonia     Ecoli UTI w/ Urine retention/prostatic enlargement/neurogenic bladder  -H/o  suprapubic catheter which was removed  -seen by urology recommended abx and foley catheter in place and outpatient follow-up.   -treated with rocephin in the hospital , discharged on augmentin , f/u with urology, [facility] [place].      Hypokalemia/hypophosphatemia/hypomagnesemia, replaced, improved, encourage oral intake   Cholangiocarcinoma history S/P radiation followed by oncology as outpatient. CT showed "Postop changes porta hepatis. Gallbladder is intact. No intrahepatic ductal dilatation  identified. There is an area of decreased attenuation measuring 4.1 x 3.3 cm represent a stable  finding compared to the prior study"  Seen by oncologist Mason Burr Blackburn  who recommended outpatient follow up in three months   Constipation Start Senokot and MiraLAX, had BM   Anxiety disorder: Mood stable resume home meds  Seizure disorder: Resume home meds-Trileptal, f/u with [facility] neurologist Cognitive impairment: Supportive care, lives by himself, reports has good family supports, f/u with neurology Mason Mason Blackburn       Discharge Exam: BP (!) 147/82 (BP Location: Right Arm)   Pulse 69   Temp 98.4 F (36.9 C) (Oral)   Resp 18   Ht '5\' 8"'$  (1.727 m)   Wt 65.9 kg   SpO2 95%   BMI 22.09 kg/m   General: NAD, pleasant, poor short term memory  Cardiovascular: RRR Respiratory: normal respiratory effort     Discharge Instructions     Ambulatory Referral for Lung Cancer Scre   Complete by: As directed    Ambulatory referral to Hematology / Oncology   Complete by: As directed    Cholangiocarcinoma, please schedule mr Mason Blackburn to see Mason Burr Blackburn in three months, details please see her consult note from 1/5, thx      Allergies as of 03/29/2022   No Known Allergies      Medication List     TAKE these medications    amoxicillin-clavulanate 875-125 MG tablet Commonly known as: AUGMENTIN Take 1 tablet by mouth 2 (two) times daily for 3 days.   finasteride 5 MG tablet Commonly known as: PROSCAR Take 5 mg by mouth daily.   guaiFENesin 600 MG 12 hr tablet Commonly known as: MUCINEX Take 1 tablet (600 mg total) by mouth 2 (two) times daily.   OXcarbazepine 300 MG/5ML suspension Commonly known as: TRILEPTAL Take 5-10 mLs (300-600 mg total) by mouth 2 (two) times daily for 5 days. Take 300 mg (5 ml) in the morning and Take 600 mg (10 ml) in the evening What changed: when to take this   saccharomyces boulardii 250 MG capsule Commonly known as: FLORASTOR Take 1 capsule (250 mg total) by mouth 2 (two) times daily for 14 days.   silodosin 8 MG Caps capsule Commonly known as:  RAPAFLO Take 8 mg by mouth daily with breakfast.               Durable Medical Equipment  (From admission, onward)           Start     Ordered   03/29/22 1121  For home use only DME Walker rolling  Once       Question Answer Comment  Walker: With [place] Wheels   Patient needs a walker to treat with the following condition FTT (failure to thrive) in adult      03/29/22 1120           No Known Allergies  Follow-up Information     Remi Haggard, MD Follow up.   Specialty: Urology Why: for Urine retention/prostatic enlargement/ foley management Contact information: [place] [place] [address] [phone]         [name], MD Follow up in 3 month(s).   Specialties: Hematology, Oncology Contact information: [place] [address] [phone]         f/u with primary care doctor for hospital discharge follow up Follow up.   Why: repeat cbc/bmp at follow up  Matilde Bash, MD Follow up.   Specialty: Neurology Why: for memory impairment Contact information: [place] [address] [phone]                  The results of significant diagnostics from this hospitalization (including imaging, microbiology, ancillary and laboratory) are listed below for reference.    Significant Diagnostic Studies: NM Pulmonary Perfusion  Result Date: 03/26/2022 CLINICAL DATA:  Shortness of breath for 1 month, chest pain RIGHT lower chest, travel, smoker EXAM: NUCLEAR MEDICINE PERFUSION LUNG SCAN TECHNIQUE: Perfusion images were obtained in multiple projections after intravenous injection of radiopharmaceutical. Ventilation scans intentionally deferred if perfusion scan and chest x-ray adequate for interpretation during COVID 19 epidemic. RADIOPHARMACEUTICALS:  4.3 mCi Tc-55mMAA IV COMPARISON:  None available Radiographic correlation: None available FINDINGS: No segmental or perfusion  defects identified to suggest pulmonary embolism. IMPRESSION: Pulmonary embolism absent. Electronically Signed   By: MLavonia DanaM.D.   On: 03/26/2022 11:44   CT ABDOMEN PELVIS W CONTRAST  Result Date: 03/25/2022 CLINICAL DATA:  RUQ pain EXAM: CT ABDOMEN AND PELVIS WITH CONTRAST TECHNIQUE: Multidetector CT imaging of the abdomen and pelvis was performed using the standard protocol following bolus administration of intravenous contrast. RADIATION DOSE REDUCTION: This exam was performed according to the departmental dose-optimization program which includes automated exposure control, adjustment of the mA and/or kV according to patient size and/or use of iterative reconstruction technique. CONTRAST:  100 mL OMNIPAQUE IOHEXOL 300 MG/ML  SOLN COMPARISON:  04/22/2020 FINDINGS: Lower chest: Alveolar consolidation right lower lobe is consistent with pneumonia. Bibasilar linear subsegmental atelectasis or scarring. Left lower lobe nodules measuring 0.7 cm, 0.6 cm, and 0.6 cm. One of the nodules appears to be new compared to 2022. Small hiatal hernia noted. Hepatobiliary: Postop changes porta hepatis. Gallbladder is intact. No intrahepatic ductal dilatation identified. There is an area of decreased attenuation measuring 4.1 x 3.3 cm represent a stable finding compared to the prior study. MRI of the liver may be helpful for follow up of this finding. Pancreas: Unremarkable. No pancreatic ductal dilatation or surrounding inflammatory changes. Spleen: Normal in size without focal abnormality. Adrenals/Urinary Tract: No adrenal lesions. Severe bilateral hydronephrosis and hydroureter. On the left there is a UVJ stone measuring 1.3 cm. On the right the urethral orifice is within a bladder diverticulum. Urinary bladder wall thickening and irregularity noted. Inflammatory or neoplastic processes are possible and could be assessed more definitively cystoscopically. Stomach/Bowel: Stomach is within normal limits. Appendix appears  normal. No evidence of bowel wall thickening, distention, or inflammatory changes. Vascular/Lymphatic: Aortic atherosclerosis. No enlarged abdominal or pelvic lymph nodes. Reproductive: Enlarged prostate noted Other: There is evidence of a urachal remnant. No abdominal wall hernia or abnormality. No abdominopelvic ascites. Musculoskeletal: Thoracolumbosacral degenerative changes. IMPRESSION: 1. Right lower lobe alveolar consolidation, likely pneumonia. 2. Small hiatal hernia. 3. Portahepatis mass, approximately 4 cm without definite interval change. 4. Severe bilateral hydronephrosis and hydroureter with a 1.3 cm stone left UVJ and a right-sided bladder diverticulum. 5. Urinary bladder wall thickening consistent with hypertrophy or inflammation. Neoplasm cannot be excluded. 6. Enlarged prostate. Electronically Signed   By: JSammie BenchM.D.   On: 03/25/2022 14:11    Microbiology: Recent Results (from the past 240 hour(s))  Urine Culture     Status: Abnormal   Collection Time: 03/25/22 10:45 AM   Specimen: Urine, Clean Catch  Result Value Ref Range Status   Specimen Description   Final    URINE, CLEAN CATCH  Performed at [facility], [place] [address] [place], [place] [address]    Special Requests   Final    NONE Performed at [facility], [place] [address], [place], [place] [address]    Culture >=100,000 COLONIES/mL ESCHERICHIA COLI (A)  Final   Report Status 03/28/2022 FINAL  Final   Organism ID, Bacteria ESCHERICHIA COLI (A)  Final      Susceptibility   Escherichia coli - MIC*    AMPICILLIN 4 SENSITIVE Sensitive     CEFAZOLIN <=4 SENSITIVE Sensitive     CEFEPIME <=0.12 SENSITIVE Sensitive     CEFTRIAXONE <=0.25 SENSITIVE Sensitive     CIPROFLOXACIN 0.5 INTERMEDIATE Intermediate     GENTAMICIN <=1 SENSITIVE Sensitive     IMIPENEM <=0.25 SENSITIVE Sensitive     NITROFURANTOIN <=16 SENSITIVE Sensitive     TRIMETH/SULFA <=20 SENSITIVE Sensitive      AMPICILLIN/SULBACTAM 4 SENSITIVE Sensitive     PIP/TAZO <=4 SENSITIVE Sensitive     * >=100,000 COLONIES/mL ESCHERICHIA COLI  Blood culture (routine x 2)     Status: None (Preliminary result)   Collection Time: 03/25/22  4:50 PM   Specimen: BLOOD LEFT HAND  Result Value Ref Range Status   Specimen Description   Final    BLOOD LEFT HAND Performed at [facility], [address] [place], [place] [address]    Special Requests   Final    BOTTLES DRAWN AEROBIC AND ANAEROBIC Blood Culture adequate volume Performed at [place] [address] [place], [place] [address]    Culture   Final    NO GROWTH 4 DAYS Performed at [facility], [place] [address] [place], [place] [address]    Report Status PENDING  Incomplete  Resp panel by RT-PCR (RSV, Flu A&B, Covid) Anterior Nasal Swab     Status: None   Collection Time: 03/25/22  5:49 PM   Specimen: Anterior Nasal Swab  Result Value Ref Range Status   SARS Coronavirus 2 by RT PCR NEGATIVE NEGATIVE Final    Comment: (NOTE) SARS-CoV-2 target nucleic acids are NOT DETECTED.  The SARS-CoV-2 RNA is generally detectable in upper respiratory specimens during the acute phase of infection. The lowest concentration of SARS-CoV-2 viral copies this assay can detect is 138 copies/mL. A negative result does not preclude SARS-Cov-2 infection and should not be used as the sole basis for treatment or other patient management decisions. A negative result may occur with  improper specimen collection/handling, submission of specimen other than nasopharyngeal swab, presence of viral mutation(s) within the areas targeted by this assay, and inadequate number of viral copies(<138 copies/mL). A negative result must be combined with clinical observations, patient history, and epidemiological information. The expected result is Negative.  Fact Sheet for Patients:  EntrepreneurPulse.com.au  Fact Sheet for  Healthcare Providers:  IncredibleEmployment.be  This test is no t yet approved or cleared by the [place] FDA and  has been authorized for detection and/or diagnosis of SARS-CoV-2 by FDA under an Emergency Use Authorization (EUA). This EUA will remain  in effect (meaning this test can be used) for the duration of the COVID-19 declaration under Section 564(b)(1) of the Act, 21 U.S.C.section 360bbb-3(b)(1), unless the authorization is terminated  or revoked sooner.       Influenza A by PCR NEGATIVE NEGATIVE Final   Influenza B by PCR NEGATIVE NEGATIVE Final    Comment: (NOTE) The Xpert Xpress SARS-CoV-2/FLU/RSV plus assay is intended as an aid  in the diagnosis of influenza from Nasopharyngeal swab specimens and should not be used as a sole basis for treatment. Nasal washings and aspirates are unacceptable for Xpert Xpress SARS-CoV-2/FLU/RSV testing.  Fact Sheet for Patients: EntrepreneurPulse.com.au  Fact Sheet for Healthcare Providers: IncredibleEmployment.be  This test is not yet approved or cleared by the [place] FDA and has been authorized for detection and/or diagnosis of SARS-CoV-2 by FDA under an Emergency Use Authorization (EUA). This EUA will remain in effect (meaning this test can be used) for the duration of the COVID-19 declaration under Section 564(b)(1) of the Act, 21 U.S.C. section 360bbb-3(b)(1), unless the authorization is terminated or revoked.     Resp Syncytial Virus by PCR NEGATIVE NEGATIVE Final    Comment: (NOTE) Fact Sheet for Patients: EntrepreneurPulse.com.au  Fact Sheet for Healthcare Providers: IncredibleEmployment.be  This test is not yet approved or cleared by the [place] FDA and has been authorized for detection and/or diagnosis of SARS-CoV-2 by FDA under an Emergency Use Authorization (EUA). This EUA will remain in effect (meaning this  test can be used) for the duration of the COVID-19 declaration under Section 564(b)(1) of the Act, 21 U.S.C. section 360bbb-3(b)(1), unless the authorization is terminated or revoked.  Performed at [facility], [place] [address] [place], [place] [address]   Blood culture (routine x 2)     Status: None (Preliminary result)   Collection Time: 03/27/22  6:21 AM   Specimen: BLOOD LEFT ARM  Result Value Ref Range Status   Specimen Description BLOOD LEFT ARM  Final   Special Requests   Final    BOTTLES DRAWN AEROBIC ONLY Blood Culture adequate volume   Culture   Final    NO GROWTH 2 DAYS Performed at [facility], [address], [place], [place] [address]    Report Status PENDING  Incomplete     Labs: Basic Metabolic Panel: Recent Labs  Lab 03/25/22 1104 03/26/22 0552 03/27/22 0621 03/28/22 0628 03/29/22 0702  NA 137 140 139 137 140  K 5.0 3.8 3.2* 3.1* 3.5  CL 103 108 108 107 109  CO2 '23 24 23 '$ 21* 23  GLUCOSE 148* 106* 89 91 91  BUN '17 19 18 19 17  '$ CREATININE 1.08 1.10 1.02 0.91 1.00  CALCIUM 10.0 8.7* 8.2* 8.2* 8.1*  MG  --   --   --  1.7 1.8  PHOS  --   --   --  2.4* 3.3   Liver Function Tests: Recent Labs  Lab 03/25/22 1104 03/26/22 0552  AST 36 29  ALT 26 24  ALKPHOS 57 49  BILITOT 1.1 1.0  PROT 7.5 6.4*  ALBUMIN 4.0 3.2*   Recent Labs  Lab 03/25/22 1104  LIPASE 35   No results for input(s): "AMMONIA" in the last 168 hours. CBC: Recent Labs  Lab 03/25/22 1104 03/26/22 0552 03/27/22 0621 03/28/22 0628 03/29/22 0702  WBC 22.7* 18.5* 14.9* 11.1* 10.9*  NEUTROABS 19.9* 15.7* 12.0* 8.0* 7.7  HGB 16.0 14.5 13.5 13.3 13.3  HCT 47.1 44.0 41.1 40.1 39.5  MCV 93.8 95.4 97.9 95.7 95.4  PLT 356 277 231 245 255   Cardiac Enzymes: No results for input(s): "CKTOTAL", "CKMB", "CKMBINDEX", "TROPONINI" in the last 168 hours. BNP: BNP (last 3 results) No results for input(s): "BNP" in the last 8760 hours.  ProBNP (last 3  results) No results for input(s): "PROBNP" in the last 8760 hours.  CBG: No results for input(s): "GLUCAP" in the last 168  hours.  FURTHER DISCHARGE INSTRUCTIONS:   Get Medicines reviewed and adjusted: Please take all your medications with you for your next visit with your Primary MD   Laboratory/radiological data: Please request your Primary MD to go over all hospital tests and procedure/radiological results at the follow up, please ask your Primary MD to get all Hospital records sent to his/her office.   In some cases, they will be blood work, cultures and biopsy results pending at the time of your discharge. Please request that your primary care M.D. goes through all the records of your hospital data and follows up on these results.   Also Note the following: If you experience worsening of your admission symptoms, develop shortness of breath, life threatening emergency, suicidal or homicidal thoughts you must seek medical attention immediately by calling 911 or calling your MD immediately  if symptoms less severe.   You must read complete instructions/literature along with all the possible adverse reactions/side effects for all the Medicines you take and that have been prescribed to you. Take any new Medicines after you have completely understood and accpet all the possible adverse reactions/side effects.    Do not drive when taking Pain medications or sleeping medications (Benzodaizepines)   Do not take more than prescribed Pain, Sleep and Anxiety Medications. It is not advisable to combine anxiety,sleep and pain medications without talking with your primary care practitioner   Special Instructions: If you have smoked or chewed Tobacco  in the last 2 yrs please stop smoking, stop any regular Alcohol  and or any Recreational drug use.   Wear Seat belts while driving.   Please note: You were cared for by a hospitalist during your hospital stay. Once you are discharged, your primary  care physician will handle any further medical issues. Please note that NO REFILLS for any discharge medications will be authorized once you are discharged, as it is imperative that you return to your primary care physician (or establish a relationship with a primary care physician if you do not have one) for your post hospital discharge needs so that they can reassess your need for medications and monitor your lab values.     Signed:  Florencia Reasons MD, PhD, FACP  [facility] 03/29/2022, 11:27 AM

## 2022-03-30 LAB — CULTURE, BLOOD (ROUTINE X 2)
Culture: NO GROWTH
Special Requests: ADEQUATE

## 2022-03-30 LAB — LEGIONELLA PNEUMOPHILA SEROGP 1 UR AG: L. pneumophila Serogp 1 Ur Ag: NEGATIVE

## 2022-03-30 NOTE — Care Management Note (Signed)
Case Management Note  Patient Details  Name: Treg Diemer MRN: [id number] Date of Birth: 1945-10-30  Subjective/Objective:                  CM received call from [facility] stating that they were not able to accept patient for home health services. CM has called referral to [facility] with [facility]. Alvis Lemmings has accepted for home health services. Daughter has been updated  Action/Plan:   Expected Discharge Date:  03/29/22               Expected Discharge Plan:     In-House Referral:     Discharge planning Services     Post Acute Care Choice:    Choice offered to:     DME Arranged:    DME Agency:     HH Arranged:    [place] Agency:     Status of Service:     If discussed at [employer], dates discussed:    Additional Comments:  Angelita Ingles, RN 03/30/2022, 11:46 AM

## 2022-04-01 LAB — CULTURE, BLOOD (ROUTINE X 2)
Culture: NO GROWTH
Special Requests: ADEQUATE

## 2022-04-16 ENCOUNTER — Encounter: Payer: Self-pay | Admitting: Family Medicine

## 2022-04-16 ENCOUNTER — Ambulatory Visit (INDEPENDENT_AMBULATORY_CARE_PROVIDER_SITE_OTHER): Payer: Medicare Other | Admitting: Family Medicine

## 2022-04-16 VITALS — BP 113/73 | HR 85 | Temp 97.5°F | Ht 68.0 in | Wt 147.0 lb

## 2022-04-16 DIAGNOSIS — G40909 Epilepsy, unspecified, not intractable, without status epilepticus: Secondary | ICD-10-CM | POA: Diagnosis not present

## 2022-04-16 DIAGNOSIS — J189 Pneumonia, unspecified organism: Secondary | ICD-10-CM | POA: Diagnosis not present

## 2022-04-16 DIAGNOSIS — N1330 Unspecified hydronephrosis: Secondary | ICD-10-CM

## 2022-04-16 DIAGNOSIS — N390 Urinary tract infection, site not specified: Secondary | ICD-10-CM

## 2022-04-16 DIAGNOSIS — E876 Hypokalemia: Secondary | ICD-10-CM

## 2022-04-16 DIAGNOSIS — N319 Neuromuscular dysfunction of bladder, unspecified: Secondary | ICD-10-CM

## 2022-04-16 DIAGNOSIS — L819 Disorder of pigmentation, unspecified: Secondary | ICD-10-CM

## 2022-04-16 DIAGNOSIS — C221 Intrahepatic bile duct carcinoma: Secondary | ICD-10-CM

## 2022-04-16 DIAGNOSIS — Z23 Encounter for immunization: Secondary | ICD-10-CM

## 2022-04-16 DIAGNOSIS — B9620 Unspecified Escherichia coli [E. coli] as the cause of diseases classified elsewhere: Secondary | ICD-10-CM

## 2022-04-16 NOTE — Progress Notes (Unsigned)
New Patient Office Visit  Subjective    Patient ID: Mason Blackburn, male    DOB: May 14, 1945  Age: 77 y.o. MRN: [id number]  CC:  Chief Complaint  Patient presents with  . New Patient (Initial Visit)    HPI Verdie Barrows presents to establish care. He is here with his daughter in law today. He has never had a PCP. He does have regular follow up with specialist however. 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 He has been seizure free for years. He is well controlled with oxcarbazepine.   He is also established with oncology for cholangiocarcinoma. This was found incidentally in 2021 on imaging. Chemo or resection was not an option. He completed radiation. Disease has been stable and asymptomatic. Plan to continue observation.   He follow up with [facility] due to neurogenic bladder. He had a long term catheter from 2020-2021. He now wears diaper or liners as needed.   He lives alone in an apartment. Daughter in law feels that he is able to care for himself well. His family helps him with transportation as he does not drive.   He was recently admitted to the hospital from 03/25/22-03/29/22. He presented with urinary retneion and RUQ abdominal pain. UA was indicative of an UTI. CT of abdomen showed sever bilateral hydronephrosis. Urology was consulted and placed a foley and recommended outpatient follow up. RLL pneumonia was also noted on the CT scan. He was treated with rocephin and zitrho IV. Urine culture was positive for UTI. This was treated with rocephin and he was discharged home on augmentin. He had a follow up appointment with urology this morning. He has completed treatment and was able to have the foley removed this morning. He has not had any follow up labs since discharge. He denies cough, congestion, fever, shortness of breath, chest pain, abdominal pain, flank pain, or urinary symtpoms.   Outpatient Encounter Medications as of 04/16/2022   Medication Sig  . finasteride (PROSCAR) 5 MG tablet Take 5 mg by mouth daily.  . OXcarbazepine (TRILEPTAL) 300 MG/5ML suspension Take 5-10 mLs (300-600 mg total) by mouth 2 (two) times daily for 5 days. Take 300 mg (5 ml) in the morning and Take 600 mg (10 ml) in the evening (Patient taking differently: Take 300-600 mg by mouth See admin instructions. Take 300 mg (5 ml) in the morning and Take 600 mg (10 ml) in the evening)  . saccharomyces boulardii (FLORASTOR) 250 MG capsule Take 250 mg by mouth 2 (two) times daily.  . silodosin (RAPAFLO) 8 MG CAPS capsule Take 8 mg by mouth daily with breakfast.  . [DISCONTINUED] guaiFENesin (MUCINEX) 600 MG 12 hr tablet Take 1 tablet (600 mg total) by mouth 2 (two) times daily.   No facility-administered encounter medications on file as of 04/16/2022.    Past Medical History:  Diagnosis Date  . Alcohol abuse   . Anxiety   . Cancer ([place])   . Cataract   . Enlarged prostate   . Seizures ([place])     Past Surgical History:  Procedure Laterality Date  . CATARACT EXTRACTION, BILATERAL  2020  . IR CATHETER TUBE CHANGE  10/03/2019    Family History  Problem Relation Age of Onset  . Alcohol abuse Mother   . Cancer Sister        breast  . Alcohol abuse Brother   . Anxiety disorder Son     Social History   Socioeconomic History  .  Marital status: Single    Spouse name: Not on file  . Number of children: 3  . Years of education: 9  . Highest education level: 9th grade  Occupational History  . Not on file  Tobacco Use  . Smoking status: Every Day    Packs/day: 3.00    Years: 55.00    Total pack years: 165.00    Types: Cigarettes  . Smokeless tobacco: Never  Vaping Use  . Vaping Use: Never used  Substance and Sexual Activity  . Alcohol use: Yes    Alcohol/week: 6.0 standard drinks of alcohol    Types: 6 Cans of beer per week  . Drug use: Never  . Sexual activity: Not Currently  Other Topics Concern  . Not on file  Social History  Narrative  . Not on file   Social Determinants of Health   Financial Resource Strain: Not on file  Food Insecurity: Not on file  Transportation Needs: Not on file  Physical Activity: Not on file  Stress: Not on file  Social Connections: Not on file  Intimate Partner Violence: Not on file    ROS      Objective    BP 113/73   Pulse 85   Temp (!) 97.5 F (36.4 C) (Temporal)   Ht '5\' 8"'$  (1.727 m)   Wt 147 lb (66.7 kg)   SpO2 93%   BMI 22.35 kg/m   Physical Exam  {Labs (Optional):23779}    Assessment & Plan:   Problem List Items Addressed This Visit   None   No follow-ups on file.   Gwenlyn Perking, FNP

## 2022-04-17 LAB — CBC WITH DIFFERENTIAL/PLATELET
Basophils Absolute: 0.1 10*3/uL (ref 0.0–0.2)
Basos: 1 %
EOS (ABSOLUTE): 0.6 10*3/uL — ABNORMAL HIGH (ref 0.0–0.4)
Eos: 6 %
Hematocrit: 39.6 % (ref 37.5–51.0)
Hemoglobin: 13.6 g/dL (ref 13.0–17.7)
Immature Grans (Abs): 0 10*3/uL (ref 0.0–0.1)
Immature Granulocytes: 0 %
Lymphocytes Absolute: 1.8 10*3/uL (ref 0.7–3.1)
Lymphs: 19 %
MCH: 31.3 pg (ref 26.6–33.0)
MCHC: 34.3 g/dL (ref 31.5–35.7)
MCV: 91 fL (ref 79–97)
Monocytes Absolute: 0.9 10*3/uL (ref 0.1–0.9)
Monocytes: 10 %
Neutrophils Absolute: 6 10*3/uL (ref 1.4–7.0)
Neutrophils: 64 %
Platelets: 381 10*3/uL (ref 150–450)
RBC: 4.35 x10E6/uL (ref 4.14–5.80)
RDW: 12.5 % (ref 11.6–15.4)
WBC: 9.5 10*3/uL (ref 3.4–10.8)

## 2022-04-17 LAB — BMP8+EGFR
BUN/Creatinine Ratio: 15 (ref 10–24)
BUN: 16 mg/dL (ref 8–27)
CO2: 25 mmol/L (ref 20–29)
Calcium: 9.6 mg/dL (ref 8.6–10.2)
Chloride: 104 mmol/L (ref 96–106)
Creatinine, Ser: 1.05 mg/dL (ref 0.76–1.27)
Glucose: 95 mg/dL (ref 70–99)
Potassium: 4 mmol/L (ref 3.5–5.2)
Sodium: 142 mmol/L (ref 134–144)
eGFR: 74 mL/min/{1.73_m2} (ref >59)

## 2022-04-18 DIAGNOSIS — R4189 Other symptoms and signs involving cognitive functions and awareness: Secondary | ICD-10-CM | POA: Insufficient documentation

## 2022-06-01 ENCOUNTER — Telehealth: Payer: Self-pay | Admitting: Family Medicine

## 2022-06-01 NOTE — Telephone Encounter (Signed)
Called patient to schedule Medicare Annual Wellness Visit (AWV). No voicemail available to leave a message.  Last date of AWV: due 09/19/2020 [facility]  Please schedule an appointment at any time with either Mickel Baas or [place], [facility]. .  If any questions, please contact me at [phone].  Thank you,  Colletta Maryland,  [place] Program Direct Dial ??[phone]

## 2022-06-25 ENCOUNTER — Other Ambulatory Visit: Payer: Self-pay

## 2022-06-25 DIAGNOSIS — C221 Intrahepatic bile duct carcinoma: Secondary | ICD-10-CM

## 2022-06-27 NOTE — Assessment & Plan Note (Deleted)
cT1bN0M0, s/p radiation  -He presented to ED initially in 06/2019 with urinary retention, liver mass was found incidentally. Patient did not get work up done as outpatient. Over the past month his physical condition declined, he lost weight, and developed UTI, metabolic encephalopathy, and sepsis. -08/2019 scan showed 5.4 cm central liver mass, biopsy confirmed cholangiocarcinoma on 08/22/19 biopsy. -His CT Chest from 09/11/19 shows Multiple bilateral solid and non solid pulmonary nodules, mainly in the left lower lobe. Indeterminate, metastasis not ruled out -Due to his poor PS, he was felt not to be a candidate for surgery or chemo, and his treatment was delayed due to his overall poor heath issue  -He completed SBRT to primary tumor with Dr Mitzi Hansen 10/31/19-11/12/19. -His FO showed no targetable mutations and he is not eligible for immunotherapy or targeted therapy at this time.  -His case was previously discussed in our GI tumor conference, both Dr. Donell Beers and Dr. Freida Busman did not think his cancer is resectable due to the central location and vascular involvement.  He has not been on surveillance since then. -He lost to follow-up after his last visit in July 2022 -I reviewed his CT abdomen the pelvis scan from January 2024, which showed stable liver lesion, no evidence of cancer progression. -Will repeat a CT chest in the next months.

## 2022-06-28 ENCOUNTER — Inpatient Hospital Stay: Payer: Medicare Other

## 2022-06-28 ENCOUNTER — Inpatient Hospital Stay: Payer: Medicare Other | Admitting: Hematology

## 2022-06-28 DIAGNOSIS — C221 Intrahepatic bile duct carcinoma: Secondary | ICD-10-CM

## 2022-06-28 NOTE — Progress Notes (Deleted)
[facility]   [phone] Fax:(336) [phone]   Clinic Follow up Note   Patient Care Team: Gabriel EaringMorgan, Tiffany M, FNP as PCP - General (Family Medicine) Pollyann SamplesBurton, Lacie K, NP as Nurse Practitioner (Nurse Practitioner) Malachy MoodFeng, Yan, MD as Consulting Physician (Oncology)  Date of Service:  06/28/2022  CHIEF COMPLAINT: f/u of cholangiocarcinoma   CURRENT THERAPY:   ASSESSMENT: *** Mason JackHarry Saxton is a 77 y.o. male with   No problem-specific Assessment & Plan notes found for this encounter.  ***   PLAN:       SUMMARY OF ONCOLOGIC HISTORY: Oncology History Overview Note  Cancer Staging Cholangiocarcinoma [facility](HCC) Staging form: Intrahepatic Bile Duct, AJCC 8th Edition - Clinical stage from 09/04/2019: Stage IB (cT1b, cN0, cM0) - Signed by Malachy MoodFeng, Yan, MD on 09/04/2019    Cholangiocarcinoma  06/30/2019 Imaging   CT AP IMPRESSION: 1. Distended urinary bladder with findings concerning for bladder outlet obstruction and possible associated cystitis. Correlation with urinalysis recommended. A 6 mm recently passed right renal calculus versus a right UVJ stone. 2. Mild bilateral hydronephrosis likely related to distended urinary bladder. 3. A 4.5 x 2.5 cm irregular hypoenhancing area in the central liver concerning for a malignancy, possibly a cholangiocarcinoma. Clinical correlation and further characterization with MRI/MRCP without and with contrast is recommended. 4. Moderate colonic stool burden. No bowel obstruction. Normal appendix. 5. Aortic Atherosclerosis (ICD10-I70.0).   08/19/2019 Imaging   ABD US: Heterogeneous liver echotexture. There is a 3.6 x 4.0 x 4.8 cm hypoechoic mass in the central liver corresponding to the lesion seen on the prior CT and concerning for malignancy. Further evaluation with MRI without and with contrast is recommended if not previously performed. Portal vein is patent on color Doppler imaging with normal direction of blood flow  towards the liver. Other: Small ascites, new since the CT. IMPRESSION: 1. Heterogeneous liver with a hypoechoic mass centrally concerning for malignancy. Further characterization with MRI without and with contrast if not previously performed is recommended. 2. Mild intrahepatic biliary ductal dilatation. 3. No gallstone or sonographic evidence of acute cholecystitis. Contracted gallbladder with small adenomyomatosis. 4. Small ascites, new since the prior CT.       08/21/2019 Imaging   MR ABD IMPRESSION: 1. Heterogeneously enhancing 5.4 x 5.4 cm posterior central left liver lobe mass, mildly increased in size since 06/30/2019 CT abdomen study. Moderate diffuse intrahepatic biliary ductal dilatation in the left liver lobe. 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 Tissue sampling suggested. 2. No abdominal lymphadenopathy. 3. Moderate to severe bilateral hydroureteronephrosis to the level of the pelvic ureters bilaterally of uncertain etiology. 4. Small volume abdominal ascites. 5. Small dependent bilateral pleural effusions.   08/22/2019 Initial Biopsy   FINAL MICROSCOPIC DIAGNOSIS:  A. LIVER, CENTRAL, BIOPSY:  - Adenocarcinoma, see comment.  COMMENT:  By immunohistochemistry the malignant cells are positive for cytokeratin  7 and CDX-2 (focal weak). They are negative for TTF-1 and cytokeratin  20. Given the location and lack of primary these findings would be  compatible with cholangiocarcinoma.    08/22/2019 Initial Diagnosis   Cholangiocarcinoma (HCC)   09/04/2019 Cancer Staging   Staging form: Intrahepatic Bile Duct, AJCC 8th Edition - Clinical stage from 09/04/2019: Stage IB (cT1b, cN0, cM0) - Signed by Malachy MoodFeng, Yan, MD on 09/04/2019   09/11/2019 Imaging   CT Chest  IMPRESSION: 1. Multiple bilateral solid and non solid pulmonary nodules. Dominant solid nodule is 6 mm in the left lower lobe with dominant non solid lesion measuring  up to 15 mm, also  in the left lower lobe. Close attention on follow-up recommended. 2. Borderline to mild mediastinal lymphadenopathy. Nodular soft tissue in the hilar regions raises the question of adenopathy although is poorly assessed on this noncontrast study. Repeat CT chest with contrast may prove helpful to further evaluate. As clinically appropriate, this could also be further assessed at the time of follow-up/restaging. 3. Aortic Atherosclerosis (ICD10-I70.0) and Emphysema (ICD10-J43.9).   09/21/2019 Genetic Testing   Foundation One  MST-Stable  Tumor Mutational Burden 3Muts/Mb ASXL1 R404* CDKN2A Loss CDKN2B loss MTAP loss   10/31/2019 - 11/12/2019 Radiation Therapy   SBRT to Primary tumor with Dr Mitzi Hansen 10/31/19-11/12/19      INTERVAL HISTORY: *** Mason Blackburn is here for a follow up of cholangiocarcinoma, he was last seen by Dr. Mosetta Putt on 09/26/2020, he presents to clinic    All other systems were reviewed with the patient and are negative.  MEDICAL HISTORY:  Past Medical History:  Diagnosis Date   Alcohol abuse    Anxiety    Cancer (HCC)    Cataract    Enlarged prostate    Seizures (HCC)     SURGICAL HISTORY: Past Surgical History:  Procedure Laterality Date   CATARACT EXTRACTION, BILATERAL  2020   IR CATHETER TUBE CHANGE  10/03/2019    I have reviewed the social history and family history with the patient and they are unchanged from previous note.  ALLERGIES:  has No Known Allergies.  MEDICATIONS:  Current Outpatient Medications  Medication Sig Dispense Refill   finasteride (PROSCAR) 5 MG tablet Take 5 mg by mouth daily.     OXcarbazepine (TRILEPTAL) 300 MG/5ML suspension Take 5-10 mLs (300-600 mg total) by mouth 2 (two) times daily for 5 days. Take 300 mg (5 ml) in the morning and Take 600 mg (10 ml) in the evening (Patient taking differently: Take 300-600 mg by mouth See admin instructions. Take 300 mg (5 ml) in the morning and Take 600 mg (10 ml) in the evening) 250 mL 12    saccharomyces boulardii (FLORASTOR) 250 MG capsule Take 250 mg by mouth 2 (two) times daily.     silodosin (RAPAFLO) 8 MG CAPS capsule Take 8 mg by mouth daily with breakfast.     No current facility-administered medications for this visit.    PHYSICAL EXAMINATION: ECOG PERFORMANCE STATUS: {CHL ONC ECOG [phone]}  There were no vitals filed for this visit. Wt Readings from Last 3 Encounters:  04/16/22 147 lb (66.7 kg)  03/26/22 145 lb 4.5 oz (65.9 kg)  09/26/20 132 lb 14.4 oz (60.3 kg)    {Only keep what was examined. If exam not performed, can use .CEXAM } GENERAL:alert, no distress and comfortable SKIN: skin color, texture, turgor are normal, no rashes or significant lesions EYES: normal, Conjunctiva are pink and non-injected, sclera clear {OROPHARYNX:no exudate, no erythema and lips, buccal mucosa, and tongue normal}  NECK: supple, thyroid normal size, non-tender, without nodularity LYMPH:  no palpable lymphadenopathy in the cervical, axillary {or inguinal} LUNGS: clear to auscultation and percussion with normal breathing effort HEART: regular rate & rhythm and no murmurs and no lower extremity edema ABDOMEN:abdomen soft, non-tender and normal bowel sounds Musculoskeletal:no cyanosis of digits and no clubbing  NEURO: alert & oriented x 3 with fluent speech, no focal motor/sensory deficits  LABORATORY DATA:  I have reviewed the data as listed    Latest Ref Rng & Units 04/16/2022    4:37 PM 03/29/2022    7:02  AM 03/28/2022    6:28 AM  CBC  WBC 3.4 - 10.8 x10E3/uL 9.5  10.9  11.1   Hemoglobin 13.0 - 17.7 g/dL 75.6  43.3  29.5   Hematocrit 37.5 - 51.0 % 39.6  39.5  40.1   Platelets 150 - 450 x10E3/uL 381  255  245         Latest Ref Rng & Units 04/16/2022    4:37 PM 03/29/2022    7:02 AM 03/28/2022    6:28 AM  CMP  Glucose 70 - 99 mg/dL 95  91  91   BUN 8 - 27 mg/dL 16  17  19    Creatinine 0.76 - 1.27 mg/dL 1.88  4.16  6.06   Sodium 134 - 144 mmol/L 142  140  137    Potassium 3.5 - 5.2 mmol/L 4.0  3.5  3.1   Chloride 96 - 106 mmol/L 104  109  107   CO2 20 - 29 mmol/L 25  23  21    Calcium 8.6 - 10.2 mg/dL 9.6  8.1  8.2       RADIOGRAPHIC STUDIES: I have personally reviewed the radiological images as listed and agreed with the findings in the report. No results found.    No orders of the defined types were placed in this encounter.  All questions were answered. The patient knows to call the clinic with any problems, questions or concerns. No barriers to learning was detected. The total time spent in the appointment was {CHL ONC TIME VISIT - TKZSW:[id number]}.     Verlee Rossetti, CMA 06/28/2022   I, Sharlette Dense, CMA, am acting as scribe for Malachy Mood, MD.   {Add scribe attestation statement}

## 2022-07-02 ENCOUNTER — Telehealth: Payer: Self-pay | Admitting: Hematology

## 2022-07-02 NOTE — Telephone Encounter (Signed)
Contacted patient to scheduled appointments. Left message with appointment details and a call back number if patient had any questions or could not accommodate the time we provided.   

## 2022-07-12 ENCOUNTER — Telehealth: Payer: Self-pay | Admitting: Family Medicine

## 2022-07-12 NOTE — Telephone Encounter (Signed)
Called patient to schedule Medicare Annual Wellness Visit (AWV). Left message for patient to call back and schedule Medicare Annual Wellness Visit (AWV).  Last date of AWV: due 09/19/2020 [facility]  Please schedule an appointment at any time with either Vernona Rieger or [place], [facility]. .  If any questions, please contact me at [phone].  Thank you,  Judeth Cornfield,  [facility] Clinical Support [facility] AWV Program Direct Dial ??[phone]

## 2022-07-15 ENCOUNTER — Inpatient Hospital Stay: Payer: Medicare Other

## 2022-07-15 ENCOUNTER — Inpatient Hospital Stay: Payer: Medicare Other | Admitting: Hematology

## 2022-07-15 NOTE — Progress Notes (Deleted)
[name]   [phone] [phone]   Clinic Follow up Note   Patient Care Team: Gabriel Earing, FNP as PCP - General (Family Medicine) Pollyann Samples, NP as Nurse Practitioner (Nurse Practitioner) Malachy Mood, MD as Consulting Physician (Oncology)  Date of Service:  07/15/2022  CHIEF COMPLAINT: f/u of {diagnosis, copy from prior note}  CURRENT THERAPY:  {Usually copy/paste from prior note, but pay attention to if treatment changes. Or change to Surveillance if they finished treatment}  ASSESSMENT: *** Mason Blackburn is a 77 y.o. male with   No problem-specific Assessment & Plan notes found for this encounter.  ***   PLAN: {Everything Dr. Mosetta Putt talks to pt about, including reviewing scans and labs. 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 Distended urinary bladder with findings concerning for bladder outlet obstruction and possible associated cystitis. Correlation with urinalysis recommended. A 6 mm recently passed right renal calculus versus a right UVJ stone. 2. Mild bilateral hydronephrosis likely related to distended urinary bladder. 3. A 4.5 x 2.5 cm irregular hypoenhancing area in the central liver concerning for a malignancy, possibly a cholangiocarcinoma. Clinical correlation and further characterization with MRI/MRCP without and with contrast is recommended. 4. Moderate colonic stool burden. No bowel obstruction. Normal appendix. 5. Aortic Atherosclerosis (ICD10-I70.0).   08/19/2019 Imaging   ABD [place]: Heterogeneous liver echotexture. There is a 3.6 x 4.0 x 4.8 cm hypoechoic mass in  the central liver corresponding to the lesion seen on the prior CT and concerning for malignancy. Further evaluation with MRI without and with contrast is recommended if not previously performed. Portal vein is patent on color Doppler imaging with normal direction of blood flow towards the liver. Other: Small ascites, new since the CT. IMPRESSION: 1. Heterogeneous liver with a hypoechoic mass centrally concerning for malignancy. Further characterization with MRI without and with contrast if not previously performed is recommended. 2. Mild intrahepatic biliary ductal dilatation. 3. No gallstone or sonographic evidence of acute cholecystitis. Contracted gallbladder with small adenomyomatosis. 4. Small ascites, new since the prior CT.       08/21/2019 Imaging   MR ABD IMPRESSION: 1. Heterogeneously enhancing 5.4 x 5.4 cm posterior central left liver lobe mass, mildly increased in size since 06/30/2019 CT abdomen study. Moderate diffuse intrahepatic biliary ductal dilatation in the left liver lobe. 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 Tissue sampling suggested. 2. No abdominal lymphadenopathy. 3. Moderate to severe bilateral hydroureteronephrosis to the level of the pelvic ureters bilaterally of uncertain etiology. 4. Small volume abdominal ascites. 5. Small dependent bilateral pleural effusions.   08/22/2019 Initial Biopsy   FINAL MICROSCOPIC DIAGNOSIS:  A. LIVER, CENTRAL, BIOPSY:  - Adenocarcinoma, see comment.  COMMENT:  By immunohistochemistry the malignant cells are positive for cytokeratin  7 and CDX-2 (focal weak). They are negative for TTF-1 and cytokeratin  20. Given the location and lack of primary these findings would be  compatible with cholangiocarcinoma.    08/22/2019 Initial Diagnosis   Cholangiocarcinoma (HCC)   09/04/2019 Cancer Staging   Staging form: Intrahepatic Bile Duct, AJCC 8th Edition - Clinical stage from 09/04/2019: Stage  IB (cT1b, cN0, cM0) - Signed  by Malachy Mood, MD on 09/04/2019   09/11/2019 Imaging   CT Chest  IMPRESSION: 1. Multiple bilateral solid and non solid pulmonary nodules. Dominant solid nodule is 6 mm in the left lower lobe with dominant non solid lesion measuring up to 15 mm, also in the left lower lobe. Close attention on follow-up recommended. 2. Borderline to mild mediastinal lymphadenopathy. Nodular soft tissue in the hilar regions raises the question of adenopathy although is poorly assessed on this noncontrast study. Repeat CT chest with contrast may prove helpful to further evaluate. As clinically appropriate, this could also be further assessed at the time of follow-up/restaging. 3. Aortic Atherosclerosis (ICD10-I70.0) and Emphysema (ICD10-J43.9).   09/21/2019 Genetic Testing   Foundation One  MST-Stable  Tumor Mutational Burden 3Muts/Mb ASXL1 R404* CDKN2A Loss CDKN2B loss MTAP loss   10/31/2019 - 11/12/2019 Radiation Therapy   SBRT to Primary tumor with Dr Mitzi Hansen 10/31/19-11/12/19      INTERVAL HISTORY: *** [name] is here for a follow up of {diagnosis} He was last seen by {provider} on {date} He presents to the clinic {alone/accompanied by}. {Everything the pt says, basically. How they're feeling, complaints and concerns, etc}   All other systems were reviewed with the patient and are negative.  MEDICAL HISTORY:  Past Medical History:  Diagnosis Date   Alcohol abuse    Anxiety    Cancer (HCC)    Cataract    Enlarged prostate    Seizures (HCC)     SURGICAL HISTORY: Past Surgical History:  Procedure Laterality Date   CATARACT EXTRACTION, BILATERAL  2020   IR CATHETER TUBE CHANGE  10/03/2019    I have reviewed the social history and family history with the patient and they are unchanged from previous note.  ALLERGIES:  has No Known Allergies.  MEDICATIONS:  Current Outpatient Medications  Medication Sig Dispense Refill   finasteride (PROSCAR) 5 MG tablet  Take 5 mg by mouth daily.     OXcarbazepine (TRILEPTAL) 300 MG/5ML suspension Take 5-10 mLs (300-600 mg total) by mouth 2 (two) times daily for 5 days. Take 300 mg (5 ml) in the morning and Take 600 mg (10 ml) in the evening (Patient taking differently: Take 300-600 mg by mouth See admin instructions. Take 300 mg (5 ml) in the morning and Take 600 mg (10 ml) in the evening) 250 mL 12   saccharomyces boulardii (FLORASTOR) 250 MG capsule Take 250 mg by mouth 2 (two) times daily.     silodosin (RAPAFLO) 8 MG CAPS capsule Take 8 mg by mouth daily with breakfast.     No current facility-administered medications for this visit.    PHYSICAL EXAMINATION: ECOG PERFORMANCE STATUS: {CHL ONC ECOG PS:[phone]}  There were no vitals filed for this visit. Wt Readings from Last 3 Encounters:  04/16/22 147 lb (66.7 kg)  03/26/22 145 lb 4.5 oz (65.9 kg)  09/26/20 132 lb 14.4 oz (60.3 kg)    {Only keep what was examined. If exam not performed, can use .CEXAM } GENERAL:alert, no distress and comfortable SKIN: skin color, texture, turgor are normal, no rashes or significant lesions EYES: normal, Conjunctiva are pink and non-injected, sclera clear {OROPHARYNX:no exudate, no erythema and lips, buccal mucosa, and tongue normal}  NECK: supple, thyroid normal size, non-tender, without nodularity LYMPH:  no palpable lymphadenopathy in the cervical, axillary {or inguinal} LUNGS: clear to auscultation and percussion with normal breathing effort HEART: regular rate & rhythm and no murmurs and no lower extremity edema ABDOMEN:abdomen soft, non-tender and  normal bowel sounds Musculoskeletal:no cyanosis of digits and no clubbing  NEURO: alert & oriented x 3 with fluent speech, no focal motor/sensory deficits  LABORATORY DATA:  I have reviewed the data as listed    Latest Ref Rng & Units 04/16/2022    4:37 PM 03/29/2022    7:02 AM 03/28/2022    6:28 AM  CBC  WBC 3.4 - 10.8 x10E3/uL 9.5  10.9  11.1   Hemoglobin  13.0 - 17.7 g/dL 16.1  09.6  04.5   Hematocrit 37.5 - 51.0 % 39.6  39.5  40.1   Platelets 150 - 450 x10E3/uL 381  255  245         Latest Ref Rng & Units 04/16/2022    4:37 PM 03/29/2022    7:02 AM 03/28/2022    6:28 AM  CMP  Glucose 70 - 99 mg/dL 95  91  91   BUN 8 - 27 mg/dL Creatinine 0.76 - 1.27 mg/dL 4.09  8.11  9.14   Sodium 134 - 144 mmol/L 142  140  137   Potassium 3.5 - 5.2 mmol/L 4.0  3.5  3.1   Chloride 96 - 106 mmol/L 104  109  107   CO2 20 - 29 mmol/L Calcium 8.6 - 10.2 mg/dL 9.6  8.1  8.2       RADIOGRAPHIC STUDIES: I have personally reviewed the radiological images as listed and agreed with the findings in the report. No results found.    No orders of the defined types were placed in this encounter.  All questions were answered. The patient knows to call the clinic with any problems, questions or concerns. No barriers to learning was detected. The total time spent in the appointment was {CHL ONC TIME VISIT - [id number]}.     Salome Holmes, CMA 07/15/2022   I, Monica Martinez, CMA, am acting as scribe for Malachy Mood, MD.   {Add scribe attestation statement}

## 2022-07-15 NOTE — Assessment & Plan Note (Deleted)
cT1bN0M0, s/p radiation  -He presented to ED initially in 06/2019 with urinary retention, liver mass was found incidentally. Patient did not get work up done as outpatient. Over the past month his physical condition declined, he lost weight, and developed UTI, metabolic encephalopathy, and sepsis. -08/2019 scan showed 5.4 cm central liver mass, biopsy confirmed cholangiocarcinoma on 08/22/19 biopsy. -His CT Chest from 09/11/19 shows Multiple bilateral solid and non solid pulmonary nodules, mainly in the left lower lobe. Indeterminate, metastasis not ruled out -Due to his poor PS, he was felt not to be a candidate for surgery or chemo, and his treatment was delayed due to his overall poor heath issue  -He completed SBRT to primary tumor with Dr Mitzi Hansen 10/31/19-11/12/19. -His FO showed no targetable mutations and he is not eligible for immunotherapy or targeted therapy at this time.  -His case was previously discussed in our GI tumor conference, both Dr. Donell Beers and Dr. Freida Busman did not think his cancer is resectable due to the central location and vascular involvement. Patient is asymptomatic, will continue observation. Disease stable on latest 04/22/20 CT CAP. When he progresses in the future, he has treatment options, such as Y 90 or systemic chemotherapy. -he lost followup after last visit in 09/2020

## 2022-07-23 ENCOUNTER — Ambulatory Visit: Payer: Self-pay | Admitting: Family Medicine

## 2022-10-20 ENCOUNTER — Encounter: Payer: Self-pay | Admitting: Family Medicine

## 2022-10-20 ENCOUNTER — Ambulatory Visit (INDEPENDENT_AMBULATORY_CARE_PROVIDER_SITE_OTHER): Payer: Medicare Other | Admitting: Family Medicine

## 2022-10-20 VITALS — BP 128/81 | HR 99 | Temp 98.3°F | Ht 68.0 in | Wt 140.1 lb

## 2022-10-20 DIAGNOSIS — Z13220 Encounter for screening for lipoid disorders: Secondary | ICD-10-CM

## 2022-10-20 DIAGNOSIS — Z130 Encounter for screening for diseases of the blood and blood-forming organs and certain disorders involving the immune mechanism: Secondary | ICD-10-CM

## 2022-10-20 DIAGNOSIS — C221 Intrahepatic bile duct carcinoma: Secondary | ICD-10-CM | POA: Diagnosis not present

## 2022-10-20 DIAGNOSIS — Z0000 Encounter for general adult medical examination without abnormal findings: Secondary | ICD-10-CM

## 2022-10-20 DIAGNOSIS — G40909 Epilepsy, unspecified, not intractable, without status epilepticus: Secondary | ICD-10-CM

## 2022-10-20 DIAGNOSIS — E559 Vitamin D deficiency, unspecified: Secondary | ICD-10-CM

## 2022-10-20 DIAGNOSIS — Z720 Tobacco use: Secondary | ICD-10-CM

## 2022-10-20 DIAGNOSIS — Z23 Encounter for immunization: Secondary | ICD-10-CM

## 2022-10-20 LAB — LIPID PANEL
Chol/HDL Ratio: 1.8 ratio (ref 0.0–5.0)
Cholesterol, Total: 168 mg/dL (ref 100–199)
HDL: 92 mg/dL (ref >39)
LDL Chol Calc (NIH): 64 mg/dL (ref 0–99)
Triglycerides: 61 mg/dL (ref 0–149)
VLDL Cholesterol Cal: 12 mg/dL (ref 5–40)

## 2022-10-20 LAB — CMP14+EGFR
ALT: 15 IU/L (ref 0–44)
AST: 33 IU/L (ref 0–40)
Albumin: 3.2 g/dL — ABNORMAL LOW (ref 3.8–4.8)
Alkaline Phosphatase: 414 IU/L — ABNORMAL HIGH (ref 44–121)
BUN/Creatinine Ratio: 11 (ref 10–24)
BUN: 9 mg/dL (ref 8–27)
Bilirubin Total: 1.5 mg/dL — ABNORMAL HIGH (ref 0.0–1.2)
CO2: 22 mmol/L (ref 20–29)
Chloride: 104 mmol/L (ref 96–106)
Creatinine, Ser: 0.83 mg/dL (ref 0.76–1.27)
Globulin, Total: 3.1 g/dL (ref 1.5–4.5)
Glucose: 112 mg/dL — ABNORMAL HIGH (ref 70–99)
Potassium: 3.2 mmol/L — ABNORMAL LOW (ref 3.5–5.2)
Sodium: 139 mmol/L (ref 134–144)
Total Protein: 6.3 g/dL (ref 6.0–8.5)
eGFR: 91 mL/min/{1.73_m2} (ref >59)

## 2022-10-20 LAB — CBC WITH DIFFERENTIAL/PLATELET

## 2022-10-20 LAB — TSH

## 2022-10-20 LAB — VITAMIN D 25 HYDROXY (VIT D DEFICIENCY, FRACTURES)

## 2022-10-20 NOTE — Progress Notes (Signed)
Complete physical exam  Patient: Mason Blackburn   DOB: [birth year]   77 y.o. Male  MRN: [id number]  Subjective:    Chief Complaint  Patient presents with   Annual Exam    Mason Blackburn is a 77 y.o. male who presents today for a complete physical exam. He reports consuming a general diet. The patient does not participate in regular exercise at present. He generally feels well. He reports sleeping well. He does not have additional problems to discuss today.    Most recent fall risk assessment:    10/20/2022    7:58 AM  Fall Risk   Falls in the past year? 0     Most recent depression screenings:    10/20/2022    7:58 AM 04/16/2022    4:32 PM  PHQ 2/9 Scores  PHQ - 2 Score 0 6  PHQ- 9 Score 0 15    Vision:Not within last year  and Dental: No current dental problems and No regular dental care   Past Medical History:  Diagnosis Date   Alcohol abuse    Anxiety    Cancer (HCC)    Cataract    Enlarged prostate    Seizures [facility])       Patient Care Team: Mason Earing, FNP as PCP - General (Family Medicine) Mason Samples, NP as Nurse Practitioner (Nurse Practitioner) Mason Mood, MD as Consulting Physician (Oncology)   Outpatient Medications Prior to Visit  Medication Sig   OXcarbazepine (TRILEPTAL) 300 MG/5ML suspension Take 5-10 mLs (300-600 mg total) by mouth 2 (two) times daily for 5 days. Take 300 mg (5 ml) in the morning and Take 600 mg (10 ml) in the evening (Patient taking differently: Take 300-600 mg by mouth See admin instructions. Take 300 mg (5 ml) in the morning and Take 600 mg (10 ml) in the evening)   [DISCONTINUED] finasteride (PROSCAR) 5 MG tablet Take 5 mg by mouth daily.   [DISCONTINUED] saccharomyces boulardii (FLORASTOR) 250 MG capsule Take 250 mg by mouth 2 (two) times daily.   [DISCONTINUED] silodosin (RAPAFLO) 8 MG CAPS capsule Take 8 mg by mouth daily with breakfast.   No facility-administered medications prior to visit.    ROS Negative unless  specially indicated above in HPI.     Objective:     BP 128/81   Pulse 99   Temp 98.3 F (36.8 C) (Temporal)   Ht 5\' 8"  (1.727 m)   Wt 140 lb 2 oz (63.6 kg)   SpO2 96%   BMI 21.31 kg/m    Physical Exam Vitals and nursing note reviewed.  Constitutional:      General: He is not in acute distress.    Appearance: He is not ill-appearing.  HENT:     Head: Normocephalic.     Right Ear: Tympanic membrane, ear canal and external ear normal.     Left Ear: Tympanic membrane, ear canal and external ear normal.     Nose: Nose normal.     Mouth/Throat:     Mouth: Mucous membranes are moist.     Pharynx: Oropharynx is clear.  Eyes:     General: No scleral icterus.    Extraocular Movements: Extraocular movements intact.     Conjunctiva/sclera: Conjunctivae normal.     Pupils: Pupils are equal, round, and reactive to light.  Neck:     Thyroid: No thyroid mass, thyromegaly or thyroid tenderness.     Vascular: No carotid bruit.  Cardiovascular:  Rate and Rhythm: Normal rate and regular rhythm.     Pulses: Normal pulses.     Heart sounds: Normal heart sounds. No murmur heard.    No friction rub. No gallop.  Pulmonary:     Effort: Pulmonary effort is normal.     Breath sounds: Normal breath sounds.  Abdominal:     General: Bowel sounds are normal. There is no distension.     Palpations: Abdomen is soft. There is no mass.     Tenderness: There is no abdominal tenderness. There is no guarding.  Musculoskeletal:        General: No swelling.     Cervical back: Normal range of motion and neck supple. No tenderness.     Right lower leg: No edema.     Left lower leg: No edema.  Skin:    General: Skin is warm and dry.     Capillary Refill: Capillary refill takes less than 2 seconds.     Coloration: Skin is not jaundiced.     Findings: No lesion or rash.  Neurological:     General: No focal deficit present.     Mental Status: He is alert and oriented to person, place, and time.      Cranial Nerves: No cranial nerve deficit.     Motor: No weakness.     Coordination: Coordination normal.     Gait: Gait normal.  Psychiatric:        Blackburn and Affect: Blackburn normal.        Behavior: Behavior normal.        Thought Content: Thought content normal.      No results found for any visits on 10/20/22.     Assessment & Plan:    Routine Health Maintenance and Physical Exam  [name] was seen today for annual exam.  Diagnoses and all orders for this visit:  Routine general medical examination at a health care facility  Vitamin D deficiency Labs pending.  -     Vitamin D, 25-hydroxy  Cholangiocarcinoma (HCC) Managed by oncology. Overdue for follow up. Reminded to schedule. -     CBC with Differential/Platelet -     CMP14+EGFR  Seizure disorder [facility]) Managed by neurology. On trileptal.   Tobacco abuse Lung cancer screening discussed. Referral placed.  -     Ambulatory Referral Lung Cancer Screening [facility]  Screening for endocrine, metabolic and immunity disorder -     TSH  Encounter for screening for lipid disorder -     Lipid panel  Need for vaccination Shingrix today.    Immunization History  Administered Date(s) Administered   Pneumococcal Conjugate-13 06/09/2015   Tdap 06/09/2015   Zoster Recombinant(Shingrix) 04/16/2022, 10/20/2022    Health Maintenance  Topic Date Due   Medicare Annual Wellness (AWV)  Never done   Lung Cancer Screening  04/23/2021   Pneumonia Vaccine 26+ Years old (2 of 2 - PPSV23 or PCV20) 04/17/2023 (Originally 08/04/2015)   Colonoscopy  04/17/2023 (Originally 04/28/1992)   Hepatitis C Screening  04/17/2023 (Originally 04/28/1963)   COVID-19 Vaccine (1) 05/03/2023 (Originally 04/28/1950)   INFLUENZA VACCINE  10/21/2022   DTaP/Tdap/Td (2 - Td or Tdap) 06/08/2025   Zoster Vaccines- Shingrix  Completed   HPV VACCINES  Aged Out    Discussed health benefits of physical activity, and encouraged him to engage in regular  exercise appropriate for his age and condition.  Problem List Items Addressed This Visit       Digestive  Cholangiocarcinoma (HCC)   Relevant Orders   CBC with Differential/Platelet   CMP14+EGFR     Nervous and Auditory   Seizure disorder (HCC)     Other   Vitamin D deficiency   Relevant Orders   Vitamin D, 25-hydroxy   Other Visit Diagnoses     Routine general medical examination at a health care facility    -  Primary   Tobacco abuse       Relevant Orders   Ambulatory Referral Lung Cancer Screening [facility]   Screening for endocrine, metabolic and immunity disorder       Relevant Orders   TSH   Encounter for screening for lipid disorder       Relevant Orders   Lipid panel   Need for vaccination       Relevant Orders   Zoster Recombinant (Shingrix ) (Completed)      Return in 1 year (on 10/20/2023) for CPE.   The patient indicates understanding of these issues and agrees with the plan.  Mason Earing, FNP

## 2022-10-20 NOTE — Patient Instructions (Signed)
Health Maintenance, Male Adopting a healthy lifestyle and getting preventive care are important in promoting health and wellness. Ask your health care provider about: The right schedule for you to have regular tests and exams. Things you can do on your own to prevent diseases and keep yourself healthy. What should I know about diet, weight, and exercise? Eat a healthy diet  Eat a diet that includes plenty of vegetables, fruits, low-fat dairy products, and lean protein. Do not eat a lot of foods that are high in solid fats, added sugars, or sodium. Maintain a healthy weight Body mass index (BMI) is a measurement that can be used to identify possible weight problems. It estimates body fat based on height and weight. Your health care provider can help determine your BMI and help you achieve or maintain a healthy weight. Get regular exercise Get regular exercise. This is one of the most important things you can do for your health. Most adults should: Exercise for at least 150 minutes each week. The exercise should increase your heart rate and make you sweat (moderate-intensity exercise). Do strengthening exercises at least twice a week. This is in addition to the moderate-intensity exercise. Spend less time sitting. Even light physical activity can be beneficial. Watch cholesterol and blood lipids Have your blood tested for lipids and cholesterol at 75 years of age, then have this test every 5 years. You may need to have your cholesterol levels checked more often if: Your lipid or cholesterol levels are high. You are older than 75 years of age. You are at high risk for heart disease. What should I know about cancer screening? Many types of cancers can be detected early and may often be prevented. Depending on your health history and family history, you may need to have cancer screening at various ages. This may include screening for: Colorectal cancer. Prostate cancer. Skin cancer. Lung  cancer. What should I know about heart disease, diabetes, and high blood pressure? Blood pressure and heart disease High blood pressure causes heart disease and increases the risk of stroke. This is more likely to develop in people who have high blood pressure readings or are overweight. Talk with your health care provider about your target blood pressure readings. Have your blood pressure checked: Every 3-5 years if you are 18-39 years of age. Every year if you are 40 years old or older. If you are between the ages of 65 and 75 and are a current or former smoker, ask your health care provider if you should have a one-time screening for abdominal aortic aneurysm (AAA). Diabetes Have regular diabetes screenings. This checks your fasting blood sugar level. Have the screening done: Once every three years after age 45 if you are at a normal weight and have a low risk for diabetes. More often and at a younger age if you are overweight or have a high risk for diabetes. What should I know about preventing infection? Hepatitis B If you have a higher risk for hepatitis B, you should be screened for this virus. Talk with your health care provider to find out if you are at risk for hepatitis B infection. Hepatitis C Blood testing is recommended for: Everyone born from 1945 through 1965. Anyone with known risk factors for hepatitis C. Sexually transmitted infections (STIs) You should be screened each year for STIs, including gonorrhea and chlamydia, if: You are sexually active and are younger than 75 years of age. You are older than 75 years of age and your   health care provider tells you that you are at risk for this type of infection. Your sexual activity has changed since you were last screened, and you are at increased risk for chlamydia or gonorrhea. Ask your health care provider if you are at risk. Ask your health care provider about whether you are at high risk for HIV. Your health care provider  may recommend a prescription medicine to help prevent HIV infection. If you choose to take medicine to prevent HIV, you should first get tested for HIV. You should then be tested every 3 months for as long as you are taking the medicine. Follow these instructions at home: Alcohol use Do not drink alcohol if your health care provider tells you not to drink. If you drink alcohol: Limit how much you have to 0-2 drinks a day. Know how much alcohol is in your drink. In the [place] one drink equals one 12 oz bottle of beer (355 mL), one 5 oz glass of wine (148 mL), or one 1 oz glass of hard liquor (44 mL). Lifestyle Do not use any products that contain nicotine or tobacco. These products include cigarettes, chewing tobacco, and vaping devices, such as e-cigarettes. If you need help quitting, ask your health care provider. Do not use street drugs. Do not share needles. Ask your health care provider for help if you need support or information about quitting drugs. General instructions Schedule regular health, dental, and eye exams. Stay current with your vaccines. Tell your health care provider if: You often feel depressed. You have ever been abused or do not feel safe at home. Summary Adopting a healthy lifestyle and getting preventive care are important in promoting health and wellness. Follow your health care provider's instructions about healthy diet, exercising, and getting tested or screened for diseases. Follow your health care provider's instructions on monitoring your cholesterol and blood pressure. This information is not intended to replace advice given to you by your health care provider. Make sure you discuss any questions you have with your health care provider. Document Revised: 07/28/2020 Document Reviewed: 07/28/2020 Elsevier Patient Education  2024 [employer]  

## 2022-10-21 ENCOUNTER — Other Ambulatory Visit: Payer: Self-pay | Admitting: Family Medicine

## 2022-10-21 ENCOUNTER — Other Ambulatory Visit: Payer: Self-pay | Admitting: *Deleted

## 2022-10-21 DIAGNOSIS — C221 Intrahepatic bile duct carcinoma: Secondary | ICD-10-CM

## 2022-10-21 DIAGNOSIS — E876 Hypokalemia: Secondary | ICD-10-CM

## 2022-10-21 DIAGNOSIS — E559 Vitamin D deficiency, unspecified: Secondary | ICD-10-CM

## 2022-10-21 MED ORDER — POTASSIUM CHLORIDE CRYS ER 10 MEQ PO TBCR: 10.0000 meq | EXTENDED_RELEASE_TABLET | Freq: Two times a day (BID) | ORAL | 0 refills | Status: DC

## 2022-10-21 MED ORDER — VITAMIN D (ERGOCALCIFEROL) 1.25 MG (50000 UNIT) PO CAPS: 50000.0000 [IU] | ORAL_CAPSULE | ORAL | 0 refills | Status: DC

## 2022-10-22 LAB — HGB A1C W/O EAG

## 2022-10-22 LAB — SPECIMEN STATUS REPORT

## 2022-10-28 ENCOUNTER — Other Ambulatory Visit: Payer: Medicare Other

## 2022-11-02 ENCOUNTER — Other Ambulatory Visit: Payer: Medicare Other

## 2022-11-02 DIAGNOSIS — E876 Hypokalemia: Secondary | ICD-10-CM

## 2022-11-02 DIAGNOSIS — C221 Intrahepatic bile duct carcinoma: Secondary | ICD-10-CM

## 2022-11-02 LAB — CMP14+EGFR
Chloride: 101 mmol/L (ref 96–106)
Potassium: 4.5 mmol/L (ref 3.5–5.2)
Sodium: 135 mmol/L (ref 134–144)
Total Protein: 6.6 g/dL (ref 6.0–8.5)

## 2022-11-11 ENCOUNTER — Telehealth: Payer: Self-pay | Admitting: Family Medicine

## 2022-11-11 NOTE — Telephone Encounter (Signed)
I can place a referral to SW and palliative care.

## 2022-11-14 ENCOUNTER — Other Ambulatory Visit: Payer: Self-pay | Admitting: Family Medicine

## 2022-11-14 DIAGNOSIS — E876 Hypokalemia: Secondary | ICD-10-CM

## 2022-11-19 ENCOUNTER — Encounter (HOSPITAL_COMMUNITY): Payer: Self-pay | Admitting: *Deleted

## 2022-11-19 ENCOUNTER — Observation Stay (HOSPITAL_COMMUNITY)
Admit: 2022-11-19 | Discharge: 2022-11-19 | Disposition: A | Discharge status: Home / Self Care | Payer: Medicare Other | Attending: Emergency Medicine | Admitting: Emergency Medicine

## 2022-11-19 ENCOUNTER — Inpatient Hospital Stay (HOSPITAL_COMMUNITY)
Admission: EM | Admit: 2022-11-19 | Discharge: 2022-11-21 | DRG: 101 | Disposition: A | Discharge status: Home / Self Care | Payer: Medicare Other | Attending: Family Medicine | Admitting: Family Medicine

## 2022-11-19 DIAGNOSIS — R339 Retention of urine, unspecified: Secondary | ICD-10-CM | POA: Diagnosis present

## 2022-11-19 DIAGNOSIS — Z9841 Cataract extraction status, right eye: Secondary | ICD-10-CM

## 2022-11-19 DIAGNOSIS — F419 Anxiety disorder, unspecified: Secondary | ICD-10-CM | POA: Diagnosis present

## 2022-11-19 DIAGNOSIS — G40909 Epilepsy, unspecified, not intractable, without status epilepticus: Secondary | ICD-10-CM

## 2022-11-19 DIAGNOSIS — Y901 Blood alcohol level of 20-39 mg/100 ml: Secondary | ICD-10-CM | POA: Diagnosis present

## 2022-11-19 DIAGNOSIS — F1010 Alcohol abuse, uncomplicated: Secondary | ICD-10-CM | POA: Diagnosis not present

## 2022-11-19 DIAGNOSIS — G40509 Epileptic seizures related to external causes, not intractable, without status epilepticus: Principal | ICD-10-CM | POA: Diagnosis present

## 2022-11-19 DIAGNOSIS — Z87820 Personal history of traumatic brain injury: Secondary | ICD-10-CM

## 2022-11-19 DIAGNOSIS — Z716 Tobacco abuse counseling: Secondary | ICD-10-CM

## 2022-11-19 DIAGNOSIS — F10129 Alcohol abuse with intoxication, unspecified: Secondary | ICD-10-CM | POA: Diagnosis present

## 2022-11-19 DIAGNOSIS — G9349 Other encephalopathy: Secondary | ICD-10-CM | POA: Diagnosis present

## 2022-11-19 DIAGNOSIS — R410 Disorientation, unspecified: Secondary | ICD-10-CM

## 2022-11-19 DIAGNOSIS — Z79899 Other long term (current) drug therapy: Secondary | ICD-10-CM

## 2022-11-19 DIAGNOSIS — Z818 Family history of other mental and behavioral disorders: Secondary | ICD-10-CM

## 2022-11-19 DIAGNOSIS — Z8505 Personal history of malignant neoplasm of liver: Secondary | ICD-10-CM

## 2022-11-19 DIAGNOSIS — R569 Unspecified convulsions: Principal | ICD-10-CM

## 2022-11-19 DIAGNOSIS — Z811 Family history of alcohol abuse and dependence: Secondary | ICD-10-CM

## 2022-11-19 DIAGNOSIS — N400 Enlarged prostate without lower urinary tract symptoms: Secondary | ICD-10-CM | POA: Diagnosis present

## 2022-11-19 DIAGNOSIS — Z9842 Cataract extraction status, left eye: Secondary | ICD-10-CM

## 2022-11-19 DIAGNOSIS — Z91148 Patient's other noncompliance with medication regimen for other reason: Secondary | ICD-10-CM

## 2022-11-19 DIAGNOSIS — F17210 Nicotine dependence, cigarettes, uncomplicated: Secondary | ICD-10-CM | POA: Diagnosis present

## 2022-11-19 LAB — COMPREHENSIVE METABOLIC PANEL
ALT: 21 U/L (ref 0–44)
AST: 37 U/L (ref 15–41)
Albumin: 3 g/dL — ABNORMAL LOW (ref 3.5–5.0)
Alkaline Phosphatase: 264 U/L — ABNORMAL HIGH (ref 38–126)
Anion gap: 4 — ABNORMAL LOW (ref 5–15)
BUN: 9 mg/dL (ref 8–23)
CO2: 24 mmol/L (ref 22–32)
Calcium: 9.4 mg/dL (ref 8.9–10.3)
Chloride: 107 mmol/L (ref 98–111)
Creatinine, Ser: 0.63 mg/dL (ref 0.61–1.24)
GFR, Estimated: >60 mL/min (ref >60)
Glucose, Bld: 107 mg/dL — ABNORMAL HIGH (ref 70–99)
Potassium: 4 mmol/L (ref 3.5–5.1)
Sodium: 135 mmol/L (ref 135–145)
Total Bilirubin: 1.2 mg/dL (ref 0.3–1.2)
Total Protein: 6.8 g/dL (ref 6.5–8.1)

## 2022-11-19 LAB — CBC WITH DIFFERENTIAL/PLATELET
Abs Immature Granulocytes: 0.02 10*3/uL (ref 0.00–0.07)
Basophils Absolute: 0.1 10*3/uL (ref 0.0–0.1)
Basophils Relative: 1 %
Eosinophils Absolute: 0.1 10*3/uL (ref 0.0–0.5)
Eosinophils Relative: 2 %
HCT: 43.3 % (ref 39.0–52.0)
Hemoglobin: 14.8 g/dL (ref 13.0–17.0)
Immature Granulocytes: 0 %
Lymphocytes Relative: 9 %
Lymphs Abs: 0.6 10*3/uL — ABNORMAL LOW (ref 0.7–4.0)
MCH: 34.7 pg — ABNORMAL HIGH (ref 26.0–34.0)
MCHC: 34.2 g/dL (ref 30.0–36.0)
MCV: 101.6 fL — ABNORMAL HIGH (ref 80.0–100.0)
Monocytes Absolute: 0.6 10*3/uL (ref 0.1–1.0)
Monocytes Relative: 10 %
Neutro Abs: 4.9 10*3/uL (ref 1.7–7.7)
Neutrophils Relative %: 78 %
Platelets: 232 10*3/uL (ref 150–400)
RBC: 4.26 MIL/uL (ref 4.22–5.81)
RDW: 13.4 % (ref 11.5–15.5)
WBC: 6.4 10*3/uL (ref 4.0–10.5)
nRBC: 0 % (ref 0.0–0.2)

## 2022-11-19 LAB — MAGNESIUM: Magnesium: 1.7 mg/dL (ref 1.7–2.4)

## 2022-11-19 LAB — CBG MONITORING, ED: Glucose-Capillary: 104 mg/dL — ABNORMAL HIGH (ref 70–99)

## 2022-11-19 LAB — ETHANOL: Alcohol, Ethyl (B): 38 mg/dL — ABNORMAL HIGH (ref <10)

## 2022-11-19 LAB — AMMONIA: Ammonia: 64 umol/L — ABNORMAL HIGH (ref 9–35)

## 2022-11-19 LAB — VITAMIN B12: Vitamin B-12: 386 pg/mL (ref 180–914)

## 2022-11-19 LAB — FOLATE: Folate: 6.4 ng/mL (ref >5.9)

## 2022-11-19 MED ORDER — LORAZEPAM 1 MG PO TABS: 0.0000 mg | ORAL_TABLET | Freq: Four times a day (QID) | ORAL | Status: DC

## 2022-11-19 MED ORDER — FOLIC ACID 1 MG PO TABS
1.0000 mg | ORAL_TABLET | Freq: Every day | ORAL | Status: DC
  Administered 2022-11-19 – 2022-11-21 (×3): 1 mg via ORAL
  Filled 2022-11-19 (×3): qty 1

## 2022-11-19 MED ORDER — NICOTINE 21 MG/24HR TD PT24
21.0000 mg | MEDICATED_PATCH | Freq: Every day | TRANSDERMAL | Status: DC
  Administered 2022-11-19 – 2022-11-21 (×3): 21 mg via TRANSDERMAL
  Filled 2022-11-19 (×3): qty 1

## 2022-11-19 MED ORDER — LORAZEPAM 1 MG PO TABS: 0.0000 mg | ORAL_TABLET | Freq: Two times a day (BID) | ORAL | Status: DC

## 2022-11-19 MED ORDER — CHLORDIAZEPOXIDE HCL 25 MG PO CAPS
50.0000 mg | ORAL_CAPSULE | Freq: Once | ORAL | Status: AC
  Administered 2022-11-19: 50 mg via ORAL
  Filled 2022-11-19: qty 2

## 2022-11-19 MED ORDER — ACETAMINOPHEN 325 MG PO TABS: 650.0000 mg | ORAL_TABLET | Freq: Four times a day (QID) | ORAL | Status: DC | PRN

## 2022-11-19 MED ORDER — DIAZEPAM 5 MG/ML IJ SOLN
5.0000 mg | Freq: Once | INTRAMUSCULAR | Status: AC
  Administered 2022-11-19: 5 mg via INTRAVENOUS
  Filled 2022-11-19: qty 2

## 2022-11-19 MED ORDER — ACETAMINOPHEN 650 MG RE SUPP: 650.0000 mg | Freq: Four times a day (QID) | RECTAL | Status: DC | PRN

## 2022-11-19 MED ORDER — OXCARBAZEPINE 300 MG/5ML PO SUSP
600.0000 mg | Freq: Every evening | ORAL | Status: DC
  Administered 2022-11-19: 600 mg via ORAL
  Filled 2022-11-19 (×3): qty 10

## 2022-11-19 MED ORDER — TAMSULOSIN HCL 0.4 MG PO CAPS
0.4000 mg | ORAL_CAPSULE | Freq: Every day | ORAL | Status: DC
  Administered 2022-11-20 – 2022-11-21 (×2): 0.4 mg via ORAL
  Filled 2022-11-19 (×2): qty 1

## 2022-11-19 MED ORDER — LORAZEPAM 1 MG PO TABS: 1.0000 mg | ORAL_TABLET | ORAL | Status: DC | PRN

## 2022-11-19 MED ORDER — ADULT MULTIVITAMIN W/MINERALS CH
1.0000 | ORAL_TABLET | Freq: Every day | ORAL | Status: DC
  Administered 2022-11-19 – 2022-11-21 (×3): 1 via ORAL
  Filled 2022-11-19 (×3): qty 1

## 2022-11-19 MED ORDER — LORAZEPAM 2 MG/ML IJ SOLN: 4.0000 mg | INTRAMUSCULAR | Status: DC | PRN

## 2022-11-19 MED ORDER — THIAMINE HCL 100 MG/ML IJ SOLN
100.0000 mg | Freq: Every day | INTRAMUSCULAR | Status: DC
  Administered 2022-11-19: 100 mg via INTRAVENOUS
  Filled 2022-11-19: qty 2

## 2022-11-19 MED ORDER — DIAZEPAM 5 MG/ML IJ SOLN: 2.5000 mg | Freq: Once | INTRAMUSCULAR | Status: DC

## 2022-11-19 MED ORDER — ONDANSETRON HCL 4 MG/2ML IJ SOLN: 4.0000 mg | Freq: Four times a day (QID) | INTRAMUSCULAR | Status: DC | PRN

## 2022-11-19 MED ORDER — SODIUM CHLORIDE 0.9% FLUSH
3.0000 mL | Freq: Two times a day (BID) | INTRAVENOUS | Status: DC
  Administered 2022-11-19 – 2022-11-21 (×4): 3 mL via INTRAVENOUS

## 2022-11-19 MED ORDER — OXCARBAZEPINE 300 MG/5ML PO SUSP
300.0000 mg | Freq: Every day | ORAL | Status: DC
  Filled 2022-11-19 (×3): qty 5

## 2022-11-19 MED ORDER — LORAZEPAM 1 MG PO TABS
0.0000 mg | ORAL_TABLET | Freq: Four times a day (QID) | ORAL | Status: DC
  Administered 2022-11-19: 1 mg via ORAL
  Filled 2022-11-19: qty 1

## 2022-11-19 MED ORDER — ENOXAPARIN SODIUM 40 MG/0.4ML IJ SOSY
40.0000 mg | PREFILLED_SYRINGE | INTRAMUSCULAR | Status: DC
  Administered 2022-11-19 – 2022-11-20 (×2): 40 mg via SUBCUTANEOUS
  Filled 2022-11-19 (×2): qty 0.4

## 2022-11-19 MED ORDER — ONDANSETRON HCL 4 MG PO TABS: 4.0000 mg | ORAL_TABLET | Freq: Four times a day (QID) | ORAL | Status: DC | PRN

## 2022-11-19 MED ORDER — OXCARBAZEPINE 300 MG PO TABS
300.0000 mg | ORAL_TABLET | ORAL | Status: AC
  Administered 2022-11-19: 300 mg via ORAL
  Filled 2022-11-19 (×2): qty 1

## 2022-11-19 MED ORDER — THIAMINE MONONITRATE 100 MG PO TABS
100.0000 mg | ORAL_TABLET | Freq: Every day | ORAL | Status: DC
  Administered 2022-11-20 – 2022-11-21 (×2): 100 mg via ORAL
  Filled 2022-11-19 (×2): qty 1

## 2022-11-19 MED ORDER — LACTULOSE 10 GM/15ML PO SOLN
30.0000 g | ORAL | Status: AC
  Administered 2022-11-19: 30 g via ORAL
  Filled 2022-11-19: qty 60

## 2022-11-19 NOTE — ED Provider Notes (Signed)
EMERGENCY DEPARTMENT AT [facility] Provider Note   CSN: [id number] Arrival date & time: 11/19/22  1133     History  Chief Complaint  Patient presents with   Seizures    Mason Blackburn is a 77 y.o. male.  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  Per EMS, was outside with neighbors and had a 15 minute generalized seizure and was confused afterwards. Was postictal and brought to the ED for evaluation.   Per daughter in law Mason Blackburn, was found unresponsive today. They thought he had a seizure and was confused afterwards. Says he still drinks heavily. Has not been taking his medications the way he should. Has multiple bottles of his trileptal that he hasn't been taking. Unsure of how much alcohol he has been drinking. Has a hx of urinary retention as well. Does have hx of alcohol withdrawal.   Patient is confused and unable to provide additional history.       Home Medications Prior to Admission medications   Medication Sig Start Date End Date Taking? Authorizing Provider  cholecalciferol (VITAMIN D3) 10 MCG/ML LIQD oral liquid Take 8-10 mLs by mouth in the morning and at bedtime. 8 ml in the morning and 10 ml at bedtime.   Yes [provider]  OXcarbazepine (TRILEPTAL) 300 MG/5ML suspension Take 5-10 mLs (300-600 mg total) by mouth 2 (two) times daily for 5 days. Take 300 mg (5 ml) in the morning and Take 600 mg (10 ml) in the evening Patient taking differently: Take 300-600 mg by mouth See admin instructions. Take 300 mg (5 ml) in the morning and Take 600 mg (10 ml) in the evening 08/21/19 11/19/22 Yes Samtani, Jai-Gurmukh, MD  potassium chloride (KLOR-CON M10) 10 MEQ tablet TAKE 1 TABLET BY MOUTH 2 TIMES DAILY. 11/15/22  Yes Gabriel Earing, FNP  silodosin (RAPAFLO) 8 MG CAPS capsule Take 8  mg by mouth daily. 10/23/22  Yes [provider]      Allergies    Patient has no known allergies.    Review of Systems   Review of Systems  Physical Exam Updated Vital Signs BP (!) 136/96   Pulse 88   Temp 98 F (36.7 C) (Oral)   Resp (!) 21   SpO2 95%  Physical Exam Vitals and nursing note reviewed.  Constitutional:      General: He is not in acute distress.    Appearance: He is well-developed.     Comments: Aaox2. Conversant  HENT:     Head: Normocephalic and atraumatic.     Right Ear: External ear normal.     Left Ear: External ear normal.     Nose: Nose normal.     Mouth/Throat:     Comments: Tongue fasciculations Eyes:     Extraocular Movements: Extraocular movements intact.     Conjunctiva/sclera: Conjunctivae normal.     Pupils: Pupils are equal, round, and reactive to light.  Cardiovascular:     Rate and Rhythm: Regular rhythm. Tachycardia present.     Heart sounds: Normal heart sounds.  Pulmonary:     Effort: Pulmonary effort is normal. No respiratory distress.     Breath sounds: Normal breath sounds.  Abdominal:     General: There is no distension.     Palpations: Abdomen is soft. There is no mass.     Tenderness: There is  no abdominal tenderness. There is no guarding.  Musculoskeletal:     Cervical back: Normal range of motion and neck supple.     Right lower leg: No edema.     Left lower leg: No edema.  Skin:    General: Skin is warm and dry.  Neurological:     Mental Status: He is alert.     Comments: MENTAL STATUS: AAOx3 CRANIAL NERVES: II: Pupils equal and reactive 3 mm mm BL, no RAPD III, IV, VI: EOM intact, no gaze preference or deviation, no nystagmus. V: normal sensation to light touch in V1, V2, and V3 segments bilaterally VII: no facial weakness or asymmetry, no nasolabial fold flattening VIII: normal hearing to speech and finger friction IX, X: normal palatal elevation, no uvular deviation XI: 5/5 head turn and 5/5 shoulder  shrug bilaterally XII: midline tongue protrusion MOTOR: 5/5 strength in R shoulder flexion, elbow flexion and extension, and grip strength. 5/5 strength in L shoulder flexion, elbow flexion and extension, and grip strength.  5/5 strength in R hip and knee flexion, knee extension, ankle plantar and dorsiflexion. 5/5 strength in L hip and knee flexion, knee extension, ankle plantar and dorsiflexion. SENSORY: Normal sensation to light touch in all extremities COORD: Normal finger to nose and heel to shin, no dysmetria, tremor noted   Psychiatric:        Mood and Affect: Mood normal.        Behavior: Behavior normal.     ED Results / Procedures / Treatments   Labs (all labs ordered are listed, but only abnormal results are displayed) Labs Reviewed  COMPREHENSIVE METABOLIC PANEL - Abnormal; Notable for the following components:      Result Value   Glucose, Bld 107 (*)    Albumin 3.0 (*)    Alkaline Phosphatase 264 (*)    Anion gap 4 (*)    All other components within normal limits  CBC WITH DIFFERENTIAL/PLATELET - Abnormal; Notable for the following components:   MCV 101.6 (*)    MCH 34.7 (*)    Lymphs Abs 0.6 (*)    All other components within normal limits  ETHANOL - Abnormal; Notable for the following components:   Alcohol, Ethyl (B) 38 (*)    All other components within normal limits  AMMONIA - Abnormal; Notable for the following components:   Ammonia 64 (*)    All other components within normal limits  CBG MONITORING, ED - Abnormal; Notable for the following components:   Glucose-Capillary 104 (*)    All other components within normal limits  MAGNESIUM  RAPID URINE DRUG SCREEN, HOSP PERFORMED  10-HYDROXYCARBAZEPINE    EKG EKG Interpretation Date/Time:  Friday November 19 2022 11:46:23 EDT Ventricular Rate:  83 PR Interval:  130 QRS Duration:  77 QT Interval:  376 QTC Calculation: 442 R Axis:   33  Text Interpretation: Sinus rhythm Confirmed by Vonita Moss  [phone]) on 11/19/2022 11:52:26 AM  Radiology No results found.  Procedures Procedures    Medications Ordered in ED Medications  thiamine (VITAMIN B1) tablet 100 mg ( Oral See Alternative 11/19/22 1630)    Or  thiamine (VITAMIN B1) injection 100 mg (100 mg Intravenous Given 11/19/22 1630)  folic acid (FOLVITE) tablet 1 mg (1 mg Oral Given 11/19/22 1632)  multivitamin with minerals tablet 1 tablet (1 tablet Oral Given 11/19/22 1631)  LORazepam (ATIVAN) tablet 0-4 mg ( Oral Not Given 11/19/22 1634)    Followed by  LORazepam (ATIVAN) tablet 0-4 mg (  has no administration in time range)  Oxcarbazepine (TRILEPTAL) tablet 300 mg (300 mg Oral Given 11/19/22 1315)  diazepam (VALIUM) injection 5 mg (5 mg Intravenous Given 11/19/22 1213)  chlordiazePOXIDE (LIBRIUM) capsule 50 mg (50 mg Oral Given 11/19/22 1314)  lactulose (CHRONULAC) 10 GM/15ML solution 30 g (30 g Oral Given 11/19/22 1631)    ED Course/ Medical Decision Making/ A&P Clinical Course as of 11/19/22 1737  Fri Nov 19, 2022  1445 Daughter-in-law at bedside.  Reports the patient is still off from his baseline. [RP]  1519 Dr Jarvis Newcomer from hospitalist to admit [RP]    Clinical Course User Index [RP] Rondel Baton, MD                                 Medical Decision Making Amount and/or Complexity of Data Reviewed Labs: ordered.  Risk OTC drugs. Prescription drug management. Decision regarding hospitalization.   Tadeh Facemire is a 77 y.o. 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   Initial Ddx:  Seizures, alcohol withdrawal, alcohol withdrawal seizures, medication noncompliance, ICH/stroke  MDM/Course:  Patient presents emergency department after a seizure.  Does appear to be postictal on arrival.  Has a nonfocal neurologic  exam.  Does have tremors and tongue fasciculations which I suspect may be due to alcohol withdrawal.  After talking to his daughter-in-law appears that he is not compliant with his home seizure medication.  Patient was given Valium in the emergency department as well as his home Trileptal.  His tremors and tongue fasciculations have improved.  He had lab work that showed an alcohol level of 38 so he may be withdrawing at this time.  Ammonia was also elevated to 64.  Upon re-evaluation patient remained persistently confused.  Daughter-in-law came to the bedside and reports that he still not at his baseline mental status.  Will admit on the hospitalist for further management of his possible hepatic encephalopathy and alcohol withdrawal.  Did consider head imaging but given the patient's nonfocal neuroexam and history of seizures and noncompliance his medication do not feel there would be much utility at this time.  This patient presents to the ED for concern of complaints listed in HPI, this involves an extensive number of treatment options, and is a complaint that carries with it a high risk of complications and morbidity. Disposition including potential need for admission considered.   Dispo: Admit to Floor  Additional history obtained from family Records reviewed Outpatient Clinic Notes The following labs were independently interpreted: Chemistry and show  elevated alk phos likely due to cholangiocarcinoma I personally reviewed and interpreted cardiac monitoring: normal sinus rhythm  I personally reviewed and interpreted the pt's EKG: see above for interpretation  I have reviewed the patients home medications and made adjustments as needed Consults: Hospitalist Social Determinants of health:  Alcohol abuse         Final Clinical Impression(s) / ED Diagnoses Final diagnoses:  Seizure (HCC)  Seizure disorder (HCC)  Alcohol abuse  Disorientation    Rx / DC Orders ED Discharge Orders      None         Rondel Baton, MD 11/19/22 1737

## 2022-11-19 NOTE — ED Notes (Signed)
Pt noted to have gotten out of bed and had diarrhea all over himself and the floor. Pt was attempting to clean up the mess out of the floor when staff walked by. Liquid yellow substance noted all over the floor and pt underwear. Very little noted on pants. Lots of liquid substance in trash can.

## 2022-11-19 NOTE — ED Notes (Signed)
Pt states he does not drink anymore like he used to, he just had 1 drink with a friend.

## 2022-11-19 NOTE — ED Triage Notes (Signed)
Pt brought in by [facility] from home with c/o seizure that lasted for 15 mins today witnessed by his neighbor. Neighbor reported his whole body was shaking and eyes were closed. Pt was post-ictal upon EMS arrival. Pt confused upon EMS arrival, but is now alert and oriented to person and place, disoriented to time and situation arrival to ED. 20g IV to left a/c. Pt is unable to swallow pills so he has liquid medication to take for his seizures, but neighbor reported he doesn't take his medication as prescribed.

## 2022-11-19 NOTE — ED Notes (Signed)
ED TO INPATIENT HANDOFF REPORT  ED Nurse Name and Phone #: Efraim Kaufmann, RN  S Name/Age/Gender Mason Blackburn 77 y.o. male Room/Bed: APA10/APA10  Code Status   Code Status: Full Code  Home/SNF/Other Home Patient oriented to: self, place, time, and situation Is this baseline? Yes   Triage Complete: Triage complete  Chief Complaint Seizure [facility]) [R56.9]  Triage Note Pt brought in by [facility] from home with c/o seizure that lasted for 15 mins today witnessed by his neighbor. Neighbor reported his whole body was shaking and eyes were closed. Pt was post-ictal upon EMS arrival. Pt confused upon EMS arrival, but is now alert and oriented to person and place, disoriented to time and situation arrival to ED. 20g IV to left a/c. Pt is unable to swallow pills so he has liquid medication to take for his seizures, but neighbor reported he doesn't take his medication as prescribed.    Allergies No Known Allergies  Level of Care/Admitting Diagnosis ED Disposition     ED Disposition  Admit   Condition  --   Comment  Hospital Area: [facility] [[id number]]  Level of Care: Med-Surg [16]  Covid Evaluation: Asymptomatic - no recent exposure (last 10 days) testing not required  Diagnosis: Seizure (HCC) [205090]  Admitting Physician: Tyrone Nine [[id number]]  Attending Physician: Tyrone Nine [phone]          B Medical/Surgery History Past Medical History:  Diagnosis Date   Alcohol abuse    Anxiety    Cancer (HCC)    Cataract    Enlarged prostate    Seizures (HCC)    Past Surgical History:  Procedure Laterality Date   CATARACT EXTRACTION, BILATERAL  2020   IR CATHETER TUBE CHANGE  10/03/2019     A IV Location/Drains/Wounds Patient Lines/Drains/Airways Status     Active Line/Drains/Airways     Name Placement date Placement time Site Days   Peripheral IV 11/19/22 20 G Right Antecubital 11/19/22  1146  Antecubital  less than 1            Intake/Output Last 24 hours No  intake or output data in the 24 hours ending 11/19/22 1953  Labs/Imaging Results for orders placed or performed during the hospital encounter of 11/19/22 (from the past 48 hour(s))  Ammonia     Status: Abnormal   Collection Time: 11/19/22 11:59 AM  Result Value Ref Range   Ammonia 64 (H) 9 - 35 umol/L    Comment: HEMOLYSIS AT THIS LEVEL MAY AFFECT RESULT Performed at [facility], [address] [place], [place] [address]   Comprehensive metabolic panel     Status: Abnormal   Collection Time: 11/19/22 12:03 PM  Result Value Ref Range   Sodium 135 135 - 145 mmol/L   Potassium 4.0 3.5 - 5.1 mmol/L   Chloride 107 98 - 111 mmol/L   CO2 24 22 - 32 mmol/L   Glucose, Bld 107 (H) 70 - 99 mg/dL    Comment: Glucose reference range applies only to samples taken after fasting for at least 8 hours.   BUN 9 8 - 23 mg/dL   Creatinine, Ser 4.13 0.61 - 1.24 mg/dL   Calcium 9.4 8.9 - 24.4 mg/dL   Total Protein 6.8 6.5 - 8.1 g/dL   Albumin 3.0 (L) 3.5 - 5.0 g/dL   AST 37 15 - 41 U/L   ALT 21 0 - 44 U/L   Alkaline Phosphatase 264 (H) 38 - 126 U/L   Total Bilirubin  1.2 0.3 - 1.2 mg/dL   GFR, Estimated >16 >10 mL/min    Comment: (NOTE) Calculated using the CKD-EPI Creatinine Equation (2021)    Anion gap 4 (L) 5 - 15    Comment: Performed at [facility], [address], [place], [place] [address]  CBC with Differential/Platelet     Status: Abnormal   Collection Time: 11/19/22 12:03 PM  Result Value Ref Range   WBC 6.4 4.0 - 10.5 K/uL   RBC 4.26 4.22 - 5.81 MIL/uL   Hemoglobin 14.8 13.0 - 17.0 g/dL   HCT 40.9 81.1 - 91.4 %   MCV 101.6 (H) 80.0 - 100.0 fL   MCH 34.7 (H) 26.0 - 34.0 pg   MCHC 34.2 30.0 - 36.0 g/dL   RDW 78.2 95.6 - 21.3 %   Platelets 232 150 - 400 K/uL   nRBC 0.0 0.0 - 0.2 %   Neutrophils Relative % 78 %   Neutro Abs 4.9 1.7 - 7.7 K/uL   Lymphocytes Relative 9 %   Lymphs Abs 0.6 (L) 0.7 - 4.0 K/uL   Monocytes Relative 10 %   Monocytes Absolute 0.6 0.1 - 1.0 K/uL    Eosinophils Relative 2 %   Eosinophils Absolute 0.1 0.0 - 0.5 K/uL   Basophils Relative 1 %   Basophils Absolute 0.1 0.0 - 0.1 K/uL   Immature Granulocytes 0 %   Abs Immature Granulocytes 0.02 0.00 - 0.07 K/uL    Comment: Performed at [facility], [address] [place], [place] [address]  Magnesium     Status: None   Collection Time: 11/19/22 12:03 PM  Result Value Ref Range   Magnesium 1.7 1.7 - 2.4 mg/dL    Comment: Performed at [facility], [address], [place], [place] [address]  Ethanol     Status: Abnormal   Collection Time: 11/19/22 12:03 PM  Result Value Ref Range   Alcohol, Ethyl (B) 38 (H) <10 mg/dL    Comment: (NOTE) Lowest detectable limit for serum alcohol is 10 mg/dL.  For medical purposes only. Performed at [facility], [address], [place], [place] [address]   CBG monitoring, ED     Status: Abnormal   Collection Time: 11/19/22 12:23 PM  Result Value Ref Range   Glucose-Capillary 104 (H) 70 - 99 mg/dL    Comment: Glucose reference range applies only to samples taken after fasting for at least 8 hours.   No results found.  Pending Labs Unresulted Labs (From admission, onward)     Start     Ordered   11/26/22 0500  Creatinine, serum  (enoxaparin (LOVENOX)    CrCl >/= 30 ml/min)  Weekly,   R     Comments: while on enoxaparin therapy    11/19/22 1856   11/19/22 1854  Vitamin B12  Once,   R        11/19/22 1856   11/19/22 1854  Folate  Once,   R        11/19/22 1856   11/19/22 1150  Rapid urine drug screen (hospital performed)  Once,   STAT        11/19/22 1150   11/19/22 1150  10-Hydroxycarbazepine  Once,   URGENT        11/19/22 1150            Vitals/Pain Today's Vitals   11/19/22 1730 11/19/22 1946 11/19/22 1948 11/19/22 1952  BP: (!) 140/80 (!) 139/92    Pulse: 87 88  96  Resp:  16 20  20   Temp:   98.3 F (36.8 C)   TempSrc:   Oral   SpO2: 95% 93%  93%    Isolation Precautions No active isolations  Medications Medications   thiamine (VITAMIN B1) tablet 100 mg ( Oral See Alternative 11/19/22 1630)    Or  thiamine (VITAMIN B1) injection 100 mg (100 mg Intravenous Given 11/19/22 1630)  folic acid (FOLVITE) tablet 1 mg (1 mg Oral Given 11/19/22 1632)  multivitamin with minerals tablet 1 tablet (1 tablet Oral Given 11/19/22 1631)  LORazepam (ATIVAN) tablet 0-4 mg ( Oral Not Given 11/19/22 1634)    Followed by  LORazepam (ATIVAN) tablet 0-4 mg (has no administration in time range)  OXcarbazepine (TRILEPTAL) 300 MG/5ML suspension 300 mg (has no administration in time range)  tamsulosin (FLOMAX) capsule 0.4 mg (has no administration in time range)  enoxaparin (LOVENOX) injection 40 mg (has no administration in time range)  sodium chloride flush (NS) 0.9 % injection 3 mL (has no administration in time range)  acetaminophen (TYLENOL) tablet 650 mg (has no administration in time range)    Or  acetaminophen (TYLENOL) suppository 650 mg (has no administration in time range)  ondansetron (ZOFRAN) tablet 4 mg (has no administration in time range)    Or  ondansetron (ZOFRAN) injection 4 mg (has no administration in time range)  nicotine (NICODERM CQ - dosed in mg/24 hours) patch 21 mg (21 mg Transdermal Patch Applied 11/19/22 1952)  LORazepam (ATIVAN) injection 4 mg (has no administration in time range)  LORazepam (ATIVAN) tablet 1-4 mg (has no administration in time range)    Or  LORazepam (ATIVAN) tablet 1 mg (has no administration in time range)  LORazepam (ATIVAN) tablet 0-4 mg ( Oral Not Given 11/19/22 1941)    Followed by  LORazepam (ATIVAN) tablet 0-4 mg (has no administration in time range)  OXcarbazepine (TRILEPTAL) 300 MG/5ML suspension 600 mg (has no administration in time range)  Oxcarbazepine (TRILEPTAL) tablet 300 mg (300 mg Oral Given 11/19/22 1315)  diazepam (VALIUM) injection 5 mg (5 mg Intravenous Given 11/19/22 1213)  chlordiazePOXIDE (LIBRIUM) capsule 50 mg (50 mg Oral Given 11/19/22 1314)  lactulose  (CHRONULAC) 10 GM/15ML solution 30 g (30 g Oral Given 11/19/22 1631)    Mobility walks     Focused Assessments Neuro Assessment Handoff:  Swallow screen pass?  Can swallow. Takes small pills or pills cut in half with applesauce     Last date known well: 11/19/22   Neuro Assessment: Within Defined Limits Neuro Checks:      Has TPA been given? No If patient is a Neuro Trauma and patient is going to OR before floor call report to 4N Charge nurse: [phone] or [phone]   R Recommendations: See Admitting Provider Note  Report given to:   Additional Notes: .

## 2022-11-19 NOTE — Procedures (Signed)
Patient Name: Mason Blackburn  MRN: [id number]  Epilepsy Attending: Charlsie Quest  Referring Physician/Provider: Charlsie Quest, MD  Date: 11/19/2022 Duration: 26.12 mins  Patient history: 77 y.o. male with medical history significant of cholangiocarcinoma, seizure disorder, alcohol abuse who presented to the ED after a 15 minute seizure today. EEG to evaluate for seizure  Level of alertness: Awake  AEDs during EEG study: Ativan, Librium, OXC  Technical aspects: This EEG study was done with scalp electrodes positioned according to the 10-20 International system of electrode placement. Electrical activity was reviewed with band pass filter of 1-70Hz , sensitivity of 7 uV/mm, display speed of 64mm/sec with a 60Hz  notched filter applied as appropriate. EEG data were recorded continuously and digitally stored.  Video monitoring was available and reviewed as appropriate.  Description: The posterior dominant rhythm consists of 8 Hz activity of moderate voltage (25-35 uV) seen predominantly in posterior head regions, symmetric and reactive to eye opening and eye closing. Hyperventilation and photic stimulation were not performed.     IMPRESSION: This study is within normal limits. No seizures or epileptiform discharges were seen throughout the recording.  A normal interictal EEG does not exclude the diagnosis of epilepsy.  Concepcion Kirkpatrick Annabelle Harman

## 2022-11-19 NOTE — H&P (Signed)
History and Physical    Patient: Mason Blackburn [id number]:[id number] DOB: 01/10/1946 DOA: 11/19/2022 DOS: the patient was seen and examined on 11/19/2022 PCP: Mason Earing, FNP  Patient coming from: Home  Chief Complaint:  Chief Complaint  Patient presents with   Seizures   HPI: Mason Blackburn is a 77 y.o. male with medical history significant of cholangiocarcinoma, seizure disorder, alcohol abuse who presented to the ED after a 15 minute seizure today. His neighbor was speaking with him when he became unresponsive this morning. When he came around he remained confused and was brought to the ED. He initially denies drinking but now states he was celebrating his neighbor's birthday and drank last night and today. During his time in the ED his mental status generally improved but hospitalists were consulted when the patient had not returned to his baseline. He is oriented to person, "hospital," and guesses that it is December. He's unable to say what season it would be if it were (since it is) August. He has no numbness or weakness. EEG in the ED did not show ongoing seizure activity. EtOH was detectable. He's been loaded with trileptal, given librium, valium, and ativan has been ordered for fear of alcohol withdrawal.   Review of Systems: As mentioned in the history of present illness. All other systems reviewed and are negative. Past Medical History:  Diagnosis Date   Alcohol abuse    Anxiety    Cancer (HCC)    Cataract    Enlarged prostate    Seizures (HCC)    Past Surgical History:  Procedure Laterality Date   CATARACT EXTRACTION, BILATERAL  2020   IR CATHETER TUBE CHANGE  10/03/2019   Social History:  reports that he has been smoking cigarettes. He has a 165 pack-year smoking history. He has never used smokeless tobacco. He reports current alcohol use of about 6.0 standard drinks of alcohol per week. He reports that he does not use drugs.  No Known Allergies  Family History  Problem  Relation Age of Onset   Alcohol abuse Mother    Cancer Sister        breast   Alcohol abuse Brother    Anxiety disorder Son     Prior to Admission medications   Medication Sig Start Date End Date Taking? Authorizing Provider  cholecalciferol (VITAMIN D3) 10 MCG/ML LIQD oral liquid Take 8-10 mLs by mouth in the morning and at bedtime. 8 ml in the morning and 10 ml at bedtime.   Yes [provider]  OXcarbazepine (TRILEPTAL) 300 MG/5ML suspension Take 5-10 mLs (300-600 mg total) by mouth 2 (two) times daily for 5 days. Take 300 mg (5 ml) in the morning and Take 600 mg (10 ml) in the evening Patient taking differently: Take 300-600 mg by mouth See admin instructions. Take 300 mg (5 ml) in the morning and Take 600 mg (10 ml) in the evening 08/21/19 11/19/22 Yes Samtani, Jai-Gurmukh, MD  potassium chloride (KLOR-CON M10) 10 MEQ tablet TAKE 1 TABLET BY MOUTH 2 TIMES DAILY. 11/15/22  Yes Mason Earing, FNP  silodosin (RAPAFLO) 8 MG CAPS capsule Take 8 mg by mouth daily. 10/23/22  Yes [provider]    Physical Exam: Vitals:   11/19/22 1230 11/19/22 1400 11/19/22 1445 11/19/22 1515  BP: 129/89 (!) 134/95 133/83 (!) 136/96  Pulse: 80 88 85 88  Resp: 19 (!) 25 20 (!) 21  Temp:      TempSrc:      SpO2: 95%  96% 96% 95%  Gen: No distress, appears chronically ill HEENT: No lingual laceration or scalp contusions Pulm: Clear, nonlabored  CV: RRR, no MRG or edema GI: Soft, NT, ND, +BS  Neuro: Alert and interactive, not completely oriented. Normal speech. No new focal deficits. Ext: Warm, no deformities. Skin: Maculopapular eruption on right lower leg/ankle. No other rashes, lesions or ulcers on visualized skin   Data Reviewed: WBC 6.4k, hgb 14.8 g/dl, plt 161. Na 135, bicarb 24, BUN 9, Cr 0.63. AP 264. Alb 3.0, AST/ALT/bili wnl. Ammonia 64.   Assessment and Plan: Acute encephalopathy: Post-ictal state and alcohol intoxication are primary drivers.  - D/w neurology, EEG does not  look too abnormal, so plan to restart his AEDs and treat for alcohol withdrawal. He's nonfocal, so not pursuing neuroimaging at this time.  - Trileptal continued, seizure precautions, prn hig dose IV ativan for recurrent seizure - Ammonia is elevated, though he has no asterixis and hepatic encephalopathy is less likely than alcohol intoxication and postictal state. Will plan for lactulose if persistently encephalopathic in AM.  - Continue CIWA for EtOH withdrawal prevention. Will give librium taper.  - No leukocytosis, respiratory symptoms, meningismus, urinary symptoms or cellulitis to suggest infectious etiology.  - Check B12, folic acid for completeness as he is macrocytic.  - Empiric thiamine, folate, MVM, TOC consult  Tobacco use:  - Nicotine patch, cessation counseling  History of cholangiocarcinoma: LFTs ok. AP slightly up. Outpatient follow up.    Advance Care Planning: full  Consults: Neurology curbside  Family Communication: None at bedside  Severity of Illness: The appropriate patient status for this patient is OBSERVATION. Observation status is judged to be reasonable and necessary in order to provide the required intensity of service to ensure the patient's safety. The patient's presenting symptoms, physical exam findings, and initial radiographic and laboratory data in the context of their medical condition is felt to place them at decreased risk for further clinical deterioration. Furthermore, it is anticipated that the patient will be medically stable for discharge from the hospital within 2 midnights of admission.   Author: Tyrone Nine, MD 11/19/2022 3:45 PM  For on call review [email or web address].

## 2022-11-19 NOTE — Care Plan (Signed)
Dr Jarvis Newcomer reported that patient has h/o seizures and came with breakthrough seizure in setting of medication non compliance as well as alcohol use. Still not back to baseline.  Will obtain eeg while at [facility]. If abnormal, recommend transfer to [facility] for long term eeg. If normal, then likely prolonged post-ictal state. Recommend resuming home anti seizure meds 9 ASMs) and manage alcohol intoxication/withdrawal.  Please call neurology for a consult if needed.   Leonora Gores Annabelle Harman

## 2022-11-19 NOTE — Progress Notes (Signed)
EEG complete - results pending 

## 2022-11-19 NOTE — ED Notes (Signed)
EEG at this time

## 2022-11-19 NOTE — ED Notes (Signed)
Seizure pads placed on pt by  NT.

## 2022-11-20 ENCOUNTER — Other Ambulatory Visit: Payer: Self-pay

## 2022-11-20 DIAGNOSIS — Z8505 Personal history of malignant neoplasm of liver: Secondary | ICD-10-CM | POA: Diagnosis not present

## 2022-11-20 DIAGNOSIS — Z91148 Patient's other noncompliance with medication regimen for other reason: Secondary | ICD-10-CM | POA: Diagnosis not present

## 2022-11-20 DIAGNOSIS — Z716 Tobacco abuse counseling: Secondary | ICD-10-CM | POA: Diagnosis not present

## 2022-11-20 DIAGNOSIS — Z811 Family history of alcohol abuse and dependence: Secondary | ICD-10-CM | POA: Diagnosis not present

## 2022-11-20 DIAGNOSIS — F419 Anxiety disorder, unspecified: Secondary | ICD-10-CM | POA: Diagnosis present

## 2022-11-20 DIAGNOSIS — Y901 Blood alcohol level of 20-39 mg/100 ml: Secondary | ICD-10-CM | POA: Diagnosis present

## 2022-11-20 DIAGNOSIS — Z79899 Other long term (current) drug therapy: Secondary | ICD-10-CM | POA: Diagnosis not present

## 2022-11-20 DIAGNOSIS — F1010 Alcohol abuse, uncomplicated: Secondary | ICD-10-CM | POA: Diagnosis present

## 2022-11-20 DIAGNOSIS — Z818 Family history of other mental and behavioral disorders: Secondary | ICD-10-CM | POA: Diagnosis not present

## 2022-11-20 DIAGNOSIS — F10129 Alcohol abuse with intoxication, unspecified: Secondary | ICD-10-CM | POA: Diagnosis present

## 2022-11-20 DIAGNOSIS — F17210 Nicotine dependence, cigarettes, uncomplicated: Secondary | ICD-10-CM | POA: Diagnosis present

## 2022-11-20 DIAGNOSIS — G40509 Epileptic seizures related to external causes, not intractable, without status epilepticus: Secondary | ICD-10-CM | POA: Diagnosis present

## 2022-11-20 DIAGNOSIS — Z9841 Cataract extraction status, right eye: Secondary | ICD-10-CM | POA: Diagnosis not present

## 2022-11-20 DIAGNOSIS — Z9842 Cataract extraction status, left eye: Secondary | ICD-10-CM | POA: Diagnosis not present

## 2022-11-20 DIAGNOSIS — N400 Enlarged prostate without lower urinary tract symptoms: Secondary | ICD-10-CM | POA: Diagnosis present

## 2022-11-20 DIAGNOSIS — R569 Unspecified convulsions: Secondary | ICD-10-CM | POA: Diagnosis not present

## 2022-11-20 DIAGNOSIS — R339 Retention of urine, unspecified: Secondary | ICD-10-CM | POA: Diagnosis present

## 2022-11-20 DIAGNOSIS — Z87820 Personal history of traumatic brain injury: Secondary | ICD-10-CM | POA: Diagnosis not present

## 2022-11-20 DIAGNOSIS — G9349 Other encephalopathy: Secondary | ICD-10-CM | POA: Diagnosis present

## 2022-11-20 MED ORDER — CYANOCOBALAMIN 1000 MCG/ML IJ SOLN
1000.0000 ug | Freq: Once | INTRAMUSCULAR | Status: AC
  Administered 2022-11-20: 1000 ug via INTRAMUSCULAR
  Filled 2022-11-20: qty 1

## 2022-11-20 MED ORDER — OXCARBAZEPINE 300 MG PO TABS
300.0000 mg | ORAL_TABLET | Freq: Every day | ORAL | Status: DC
  Administered 2022-11-20 – 2022-11-21 (×2): 300 mg via ORAL
  Filled 2022-11-20 (×2): qty 1

## 2022-11-20 MED ORDER — OXCARBAZEPINE 300 MG PO TABS
600.0000 mg | ORAL_TABLET | Freq: Every day | ORAL | Status: DC
  Administered 2022-11-20: 600 mg via ORAL
  Filled 2022-11-20: qty 2

## 2022-11-20 NOTE — Plan of Care (Signed)

## 2022-11-20 NOTE — Progress Notes (Signed)
[facility] PROGRESS NOTE  Alexandros Ervin (DOB: 05/17/1945) UYQ:[id number] PCP: Gabriel Earing, FNP  Brief Narrative: Mason Blackburn is a 77 y.o. male with a history of cholangiocarcinoma, EtOH abuse, TBI and seizure disorder who presented to the ED on 11/19/2022 after a 15 minutes seizure. EEG in the ED did not show ongoing seizure activity. EtOH was detectable. He's been loaded with trileptal, given librium, valium, and ativan has been ordered for fear of alcohol withdrawal. He remains encephalopathic though nonfocal.   Subjective: Has no complaints. He does not recognize me from yesterday. He guesses that it is May 2024 and that Danae Orleans is president. Doesn't know that he is in a hospital.   Objective: BP 130/84 (BP Location: Left Arm)   Pulse 85   Temp 98.4 F (36.9 C)   Resp 20   Ht 5\' 8"  (1.727 m)   Wt 60.6 kg   SpO2 95%   BMI 20.31 kg/m   Gen: No distress older male Pulm: Clear, nonlabored  CV: RRR, no edema GI: Soft, NT, ND, +BS  Neuro: Alert and interactive with no dysarthria. CNs intact, no motor or sensory deficits x4 extremities. No tremor. Ext: Warm, no deformities Skin: No rashes, lesions or ulcers on visualized skin   Assessment & Plan: Acute encephalopathy: Post-ictal state and alcohol intoxication are primary drivers.  - Continue to monitor as we treat any emerging withdrawal symptoms.  - He is not currently oriented enough to return home alone. TOC consulted.  - With history of TBI, the baseline to which he returns is unclear.  - Ammonia is elevated, though he has no asterixis and hepatic encephalopathy is less likely than alcohol intoxication and postictal state. He's had loose stools, so reluctant to give lactulose. - No leukocytosis, respiratory symptoms, meningismus, urinary symptoms or cellulitis to suggest infectious etiology.  - UDS still pending  Seizure: In setting of likely not taking AEDs and alcohol use. EEG in ED showed no seizure or epileptiform  discharges.  - Trileptal continued, seizure precautions, prn IV ativan for recurrent seizure  Alcohol abuse:  - Continue CIWA for EtOH withdrawal prevention. Given librium and seems ok, will initiate taper if/when withdrawal symptoms worsen - Empiric thiamine, folate, MVM, TOC consult. Folic acid and B12 are wnl but lower limits, so continue supplementing   Tobacco use:  - Nicotine patch, cessation counseling   History of cholangiocarcinoma: LFTs ok. AP slightly up. Outpatient follow up.   Tyrone Nine, MD [facility] [email or web address] 11/20/2022, 12:24 PM

## 2022-11-20 NOTE — Progress Notes (Signed)
   11/20/22 1359  TOC Brief Assessment  Insurance and Status Reviewed  Patient has primary care physician Yes  Home environment has been reviewed alone  Prior level of function: indep  Prior/Current Home Services No current home services  Social Determinants of Health Reivew SDOH reviewed needs interventions (SA resources added to d/c insturctions)  Readmission risk has been reviewed Yes  Transition of care needs no transition of care needs at this time   SA resources added to d/c instructions

## 2022-11-20 NOTE — Discharge Instructions (Signed)
Substance Abuse Resources:   Outpatient Substance Use Treatment Services  Agency Name: [facility] Address: [address] [place], [place] [address] Phone: [phone] / [phone] Website: [email or web address] Offered: Support groups for Bipolar, substance abuse, anger management, panic depression and anxiety. Mobile crisis unit, outpatient therapy, substance abuse treatment.   [facility] [address] [place], [place] [address] [phone] Private insurance, Medicare A&B, and GCCN  [facility] [address] [place], [place] [address] [phone] Medicaid, Self Pay  [facility] [address] [place], [place] [phone] Medicaid and Private Insurance, Self Pay  [facility] [address] [place], [place] [phone] Private Insurance, and Self Pay  [facility] [address] [place], [place] [address] [phone] or [phone] Private Insurance Only  [facility] [address] [place], [place] [address] [phone] Medicaid, Medicare, Private Insurance  [facility] [address] [place], [place] [address] [phone] Services are free or reduced  [facility] [address] [phone] Self Pay only, sliding scale  [facility] [address] [place], [place] [address] [phone] ([facility]) Self Pay, Medicaid Only  [facility] [address] [place], [place] [address] [phone] Medicaid, Medicare, Private Insurance  Residential Substance Use Treatment Services  [facility] [address] [place], [place] [address] [phone] or [phone] Detox (Medicare, Medicaid, private insurance, and self  pay) Residential Rehab 14 days (Medicare, Medicaid, private insurance, and self pay)  [facility] [address] [place], [place] [phone] Male and Male Detox (Self Pay and Medicaid limited availability) Rehab only Male (Medicaid and self pay only)  [facility] [address] [place], [place] [address] [phone] or [phone] Detox and Residential Treatment Private Insurance Only   [facility] [address] [place], [place] [address] [phone] Treatment Only, must make assessment appointment, and must be sober for assessment appointment. Self Pay Only, Medicare A&B, [place] Medicaid, [place] ID only! *Transportation assistance offered from [employer] [address]  [facility] [address] [place], [place] [address] Walk in interviews M-Sat 8-4p No pending legal charges [phone]  [facility]: [facility] [address] [place], [place] [phone] (Self Pay, Medicaid) [facility] [address] [place], [place] [address] [phone] Detox and Residential Treatment Medicare and Private Insurance  [facility] [address] [place], [place] [address] 28 Day Women's Facility: [phone] 28 Day Men's Facility: [phone] Long-term Residential Program: [phone] Males 25 and Over (No Insurance, upfront fee)  [facility] [address] [place], [place] [address] [phone] Private Insurance with [employer], Private Pay [facility] [address] [place],  [address] Local [phone] Private Insurance Only  [facility] [address] [place],  [address] [phone] (Males, upfront fee)  [facility] [place] [address] [place], [address] [phone] Private Insurance       

## 2022-11-20 NOTE — Evaluation (Signed)
Physical Therapy Brief Evaluation and Discharge Note Patient Details Name: Mason Blackburn MRN: [id number] DOB: 11/11/1945 Today's Date: 11/20/2022   History of Present Illness  Mason Blackburn is a 77 y.o. male with medical history significant of cholangiocarcinoma, seizure disorder, alcohol abuse who presented to the ED after a 15 minute seizure today. His neighbor was speaking with him when he became unresponsive this morning. When he came around he remained confused and was brought to the ED. He initially denies drinking but now states he was celebrating his neighbor's birthday and drank last night and today. During his time in the ED his mental status generally improved but hospitalists were consulted when the patient had not returned to his baseline. He is oriented to person, "hospital," and guesses that it is December. He's unable to say what season it would be if it were (since it is) August. He has no numbness or weakness. EEG in the ED did not show ongoing seizure activity. EtOH was detectable. He's been loaded with trileptal, given librium, valium, and ativan has been ordered for fear of alcohol withdrawal.  Clinical Impression  PT appears to be at prior functional level.        PT Assessment  PT I with bed mobility, I with ambulation x 200 ft without assistive device.   Assistance Needed at Discharge   none    Equipment Recommendations None recommended by PT  Recommendations for Other Services   none    Precautions/Restrictions Precautions Precautions: None Restrictions Weight Bearing Restrictions: No        Mobility  Bed Mobility    I      Transfers        I                Ambulation/Gait    I x 200 ft.               Stairs  Pt has not stairs at home                Pertinent Vitals/Pain   Pain Assessment Pain Assessment: No/denies pain     Home Living   Living Arrangements: Alone       Home Equipment: None        Prior  Function  Mod I with ADL does not drive         Communication   Communication Communication: No apparent difficulties     Cognition  PT knows the month and year, states he is in a hospital but unsure which one.               Assessment/Plan    PT Problem List         PT Visit Diagnosis Other abnormalities of gait and mobility (R26.89)    No Skilled PT Patient is supervision for all activity/mobility    AMPAC 6 Clicks Help needed turning from your back to your side while in a flat bed without using bedrails?: None Help needed moving from lying on your back to sitting on the side of a flat bed without using bedrails?: None Help needed moving to and from a bed to a chair (including a wheelchair)?: None Help needed standing up from a chair using your arms (e.g., wheelchair or bedside chair)?: None Help needed to walk in hospital room?: None Help needed climbing 3-5 steps with a railing? : A Little 6 Click Score: 23      End of Session Equipment Utilized During Treatment: Gait belt  Activity Tolerance: Patient tolerated treatment well Patient left: in chair;with call bell/phone within reach;with chair alarm set   PT Visit Diagnosis: Other abnormalities of gait and mobility (R26.89)     Time: 7425-9563 PT Time Calculation (min) (ACUTE ONLY): 15 min  Charges:   PT Evaluation $PT Eval Low Complexity: 1 Low      Virgina Organ, PT CLT [phone]   11/20/2022, 12:45 PM

## 2022-11-21 DIAGNOSIS — R569 Unspecified convulsions: Secondary | ICD-10-CM | POA: Diagnosis not present

## 2022-11-21 NOTE — Progress Notes (Signed)
Nsg Discharge Note  Admit Date:  11/19/2022 Discharge date: 11/21/2022   Mason Blackburn to be D/C'd Home per MD order.  AVS completed.   Patient/caregiver able to verbalize understanding.  Discharge Medication: Allergies as of 11/21/2022   No Known Allergies      Medication List     TAKE these medications    cholecalciferol 10 MCG/ML Liqd oral liquid Commonly known as: VITAMIN D3 Take 8-10 mLs by mouth in the morning and at bedtime. 8 ml in the morning and 10 ml at bedtime.   Klor-Con M10 10 MEQ tablet Generic drug: potassium chloride TAKE 1 TABLET BY MOUTH 2 TIMES DAILY.   OXcarbazepine 300 MG/5ML suspension Commonly known as: TRILEPTAL Take 5-10 mLs (300-600 mg total) by mouth 2 (two) times daily for 5 days. Take 300 mg (5 ml) in the morning and Take 600 mg (10 ml) in the evening What changed: when to take this   silodosin 8 MG Caps capsule Commonly known as: RAPAFLO Take 8 mg by mouth daily.        Discharge Assessment: Vitals:   11/21/22 0010 11/21/22 0423  BP: 103/70 (!) 114/94  Pulse: (!) 101 88  Resp:    Temp: 99.1 F (37.3 C) 97.8 F (36.6 C)  SpO2: 97% 98%   Skin clean, dry and intact without evidence of skin break down, no evidence of skin tears noted. IV catheter discontinued intact. Site without signs and symptoms of complications - no redness or edema noted at insertion site, patient denies c/o pain - only slight tenderness at site.  Dressing with slight pressure applied.  D/c Instructions-Education: Discharge instructions given to patient/family with verbalized understanding. D/c education completed with patient/family including follow up instructions, medication list, d/c activities limitations if indicated, with other d/c instructions as indicated by MD - patient able to verbalize understanding, all questions fully answered. Patient instructed to return to ED, call 911, or call MD for any changes in condition.  Patient escorted via WC, and D/C home  via private auto.  Laurena Spies, RN 11/21/2022 11:32 AM

## 2022-11-21 NOTE — Plan of Care (Signed)

## 2022-11-21 NOTE — Discharge Summary (Signed)
Physician Discharge Summary   Patient: Mason Blackburn MRN: [id number] DOB: [birth year]  Admit date:     11/19/2022  Discharge date: 11/21/22  Discharge Physician: Mason Blackburn   PCP: Mason Earing, FNP   Recommendations at discharge:  Follow up with PCP in next 1-2 weeks for routine care.  Follow up with oncology per routine. If pt has seizure in the setting of medication adherence and alcohol abstinence, would suggest returning to neurologist.  Discharge Diagnoses: Principal Problem:   Seizure [facility])  Hospital Course: Mason Blackburn is a 77 y.o. male with a history of cholangiocarcinoma, EtOH abuse, TBI and seizure disorder who presented to the ED on 11/19/2022 after a 15 minutes seizure. EEG in the ED did not show ongoing seizure activity. EtOH was detectable. He's been loaded with trileptal, given librium, valium, and ativan has been ordered for fear of alcohol withdrawal. He has returned to his mental baseline and functionally independent. No evidence of significant withdrawal has been noted. He will be discharged in stable condition.    Assessment and Plan: Acute encephalopathy: Post-ictal state and alcohol intoxication are primary drivers. This seems to have resolved back to his baseline (s/p remote TBI). No ongoing withdrawal symptoms noted, no prn ativan or other benzodiazepine needed for > 24 hours. No leukocytosis, respiratory symptoms, meningismus, urinary symptoms or cellulitis to suggest infectious etiology.    Seizure: In setting of likely not taking AEDs and alcohol use. EEG in ED showed no seizure or epileptiform discharges.  - Trileptal continued, seizure precautions/driving, etc. restrictions preovided   Alcohol abuse: Cessation counseling was provided. Substance abuse resources also given.     Tobacco use: Cessation counseling   History of cholangiocarcinoma: LFTs ok. AP slightly up. Outpatient follow up.   Consultants: Neurology Procedures performed: EEG   Disposition: Home Diet recommendation: Regular DISCHARGE MEDICATION: Allergies as of 11/21/2022   No Known Allergies      Medication List     TAKE these medications    cholecalciferol 10 MCG/ML Liqd oral liquid Commonly known as: VITAMIN D3 Take 8-10 mLs by mouth in the morning and at bedtime. 8 ml in the morning and 10 ml at bedtime.   Klor-Con M10 10 MEQ tablet Generic drug: potassium chloride TAKE 1 TABLET BY MOUTH 2 TIMES DAILY.   OXcarbazepine 300 MG/5ML suspension Commonly known as: TRILEPTAL Take 5-10 mLs (300-600 mg total) by mouth 2 (two) times daily for 5 days. Take 300 mg (5 ml) in the morning and Take 600 mg (10 ml) in the evening What changed: when to take this   silodosin 8 MG Caps capsule Commonly known as: RAPAFLO Take 8 mg by mouth daily.        Follow-up Information     Mason Earing, FNP Follow up.   Specialty: Family Medicine Contact information: [address] [place] [address] [phone]                Discharge Exam: [name] Weights   11/19/22 2012  Weight: 60.6 kg  BP (!) 114/94 (BP Location: Right Arm)   Pulse 88   Temp 97.8 F (36.6 C)   Resp 20   Ht 5\' 8"  (1.727 m)   Wt 60.6 kg   SpO2 98%   BMI 20.31 kg/m   77yo M in no distress Clear, nonlabored RRR, no MRG No large wounds, no ecchymoses on scalp, no tongue laceration Alert, interactive, no focal deficits  Condition at discharge: stable  The results of significant  diagnostics from this hospitalization (including imaging, microbiology, ancillary and laboratory) are listed below for reference.   Imaging Studies: EEG adult  Result Date: 2022-11-25 Charlsie Quest, MD     2022-11-25 10:58 PM Patient Name: Mason Blackburn MRN: [id number] Epilepsy Attending: Charlsie Quest Referring Physician/Provider: Charlsie Quest, MD Date: 11-25-22 Duration: 26.12 mins Patient history: 77 y.o. male with medical history significant of cholangiocarcinoma, seizure disorder,  alcohol abuse who presented to the ED after a 15 minute seizure today. EEG to evaluate for seizure Level of alertness: Awake AEDs during EEG study: Ativan, Librium, OXC Technical aspects: This EEG study was done with scalp electrodes positioned according to the 10-20 International system of electrode placement. Electrical activity was reviewed with band pass filter of 1-70Hz , sensitivity of 7 uV/mm, display speed of 1mm/sec with a 60Hz  notched filter applied as appropriate. EEG data were recorded continuously and digitally stored.  Video monitoring was available and reviewed as appropriate. Description: The posterior dominant rhythm consists of 8 Hz activity of moderate voltage (25-35 uV) seen predominantly in posterior head regions, symmetric and reactive to eye opening and eye closing. Hyperventilation and photic stimulation were not performed.   IMPRESSION: This study is within normal limits. No seizures or epileptiform discharges were seen throughout the recording. A normal interictal EEG does not exclude the diagnosis of epilepsy. Charlsie Quest    Microbiology: Results for orders placed or performed during the hospital encounter of 03/25/22  Urine Culture     Status: Abnormal   Collection Time: 03/25/22 10:45 AM   Specimen: Urine, Clean Catch  Result Value Ref Range Status   Specimen Description   Final    URINE, CLEAN CATCH Performed at [facility], [address] [place], [place] [address]    Special Requests   Final    NONE Performed at [facility], [address] [place], [place] [address]    Culture >=100,000 COLONIES/mL ESCHERICHIA COLI (A)  Final   Report Status 03/28/2022 FINAL  Final   Organism ID, Bacteria ESCHERICHIA COLI (A)  Final      Susceptibility   Escherichia coli - MIC*    AMPICILLIN 4 SENSITIVE Sensitive     CEFAZOLIN <=4 SENSITIVE Sensitive     CEFEPIME <=0.12 SENSITIVE Sensitive     CEFTRIAXONE <=0.25 SENSITIVE Sensitive      CIPROFLOXACIN 0.5 INTERMEDIATE Intermediate     GENTAMICIN <=1 SENSITIVE Sensitive     IMIPENEM <=0.25 SENSITIVE Sensitive     NITROFURANTOIN <=16 SENSITIVE Sensitive     TRIMETH/SULFA <=20 SENSITIVE Sensitive     AMPICILLIN/SULBACTAM 4 SENSITIVE Sensitive     PIP/TAZO <=4 SENSITIVE Sensitive     * >=100,000 COLONIES/mL ESCHERICHIA COLI  Blood culture (routine x 2)     Status: None   Collection Time: 03/25/22  4:50 PM   Specimen: BLOOD LEFT HAND  Result Value Ref Range Status   Specimen Description   Final    BLOOD LEFT HAND Performed at [facility], [address] [place], [place] [address]    Special Requests   Final    BOTTLES DRAWN AEROBIC AND ANAEROBIC Blood Culture adequate volume Performed at [facility], [address] [place], [place] [address]    Culture   Final    NO GROWTH 5 DAYS Performed at [facility], [address], [place], [place] [address]    Report Status 03/30/2022 FINAL  Final  Resp panel by RT-PCR (RSV,  Flu A&B, Covid) Anterior Nasal Swab     Status: None   Collection Time: 03/25/22  5:49 PM   Specimen: Anterior Nasal Swab  Result Value Ref Range Status   SARS Coronavirus 2 by RT PCR NEGATIVE NEGATIVE Final    Comment: (NOTE) SARS-CoV-2 target nucleic acids are NOT DETECTED.  The SARS-CoV-2 RNA is generally detectable in upper respiratory specimens during the acute phase of infection. The lowest concentration of SARS-CoV-2 viral copies this assay can detect is 138 copies/mL. A negative result does not preclude SARS-Cov-2 infection and should not be used as the sole basis for treatment or other patient management decisions. A negative result may occur with  improper specimen collection/handling, submission of specimen other than nasopharyngeal swab, presence of viral mutation(s) within the areas targeted by this assay, and inadequate number of viral copies(<138 copies/mL). A negative result must be combined  with clinical observations, patient history, and epidemiological information. The expected result is Negative.  Fact Sheet for Patients:  BloggerCourse.com  Fact Sheet for Healthcare Providers:  SeriousBroker.it  This test is no t yet approved or cleared by the [place] FDA and  has been authorized for detection and/or diagnosis of SARS-CoV-2 by FDA under an Emergency Use Authorization (EUA). This EUA will remain  in effect (meaning this test can be used) for the duration of the COVID-19 declaration under Section 564(b)(1) of the Act, 21 U.S.C.section 360bbb-3(b)(1), unless the authorization is terminated  or revoked sooner.       Influenza A by PCR NEGATIVE NEGATIVE Final   Influenza B by PCR NEGATIVE NEGATIVE Final    Comment: (NOTE) The Xpert Xpress SARS-CoV-2/FLU/RSV plus assay is intended as an aid in the diagnosis of influenza from Nasopharyngeal swab specimens and should not be used as a sole basis for treatment. Nasal washings and aspirates are unacceptable for Xpert Xpress SARS-CoV-2/FLU/RSV testing.  Fact Sheet for Patients: BloggerCourse.com  Fact Sheet for Healthcare Providers: SeriousBroker.it  This test is not yet approved or cleared by the [place] FDA and has been authorized for detection and/or diagnosis of SARS-CoV-2 by FDA under an Emergency Use Authorization (EUA). This EUA will remain in effect (meaning this test can be used) for the duration of the COVID-19 declaration under Section 564(b)(1) of the Act, 21 U.S.C. section 360bbb-3(b)(1), unless the authorization is terminated or revoked.     Resp Syncytial Virus by PCR NEGATIVE NEGATIVE Final    Comment: (NOTE) Fact Sheet for Patients: BloggerCourse.com  Fact Sheet for Healthcare Providers: SeriousBroker.it  This test is not yet approved  or cleared by the [place] FDA and has been authorized for detection and/or diagnosis of SARS-CoV-2 by FDA under an Emergency Use Authorization (EUA). This EUA will remain in effect (meaning this test can be used) for the duration of the COVID-19 declaration under Section 564(b)(1) of the Act, 21 U.S.C. section 360bbb-3(b)(1), unless the authorization is terminated or revoked.  Performed at [facility], [address] [place], [place] [address]   Blood culture (routine x 2)     Status: None   Collection Time: 03/27/22  6:21 AM   Specimen: BLOOD LEFT ARM  Result Value Ref Range Status   Specimen Description BLOOD LEFT ARM  Final   Special Requests   Final    BOTTLES DRAWN AEROBIC ONLY Blood Culture adequate volume   Culture   Final    NO GROWTH 5 DAYS Performed at [facility], [address], [place], [place] [address]  Report Status 04/01/2022 FINAL  Final    Labs: CBC: Recent Labs  Lab 11/19/22 1203  WBC 6.4  NEUTROABS 4.9  HGB 14.8  HCT 43.3  MCV 101.6*  PLT 232   Basic Metabolic Panel: Recent Labs  Lab 11/19/22 1203  NA 135  K 4.0  CL 107  CO2 24  GLUCOSE 107*  BUN 9  CREATININE 0.63  CALCIUM 9.4  MG 1.7   Liver Function Tests: Recent Labs  Lab 11/19/22 1203  AST 37  ALT 21  ALKPHOS 264*  BILITOT 1.2  PROT 6.8  ALBUMIN 3.0*   CBG: Recent Labs  Lab 11/19/22 1223  GLUCAP 104*    Discharge time spent: greater than 30 minutes.  Signed: Tyrone Nine, MD [facility] 11/21/2022

## 2022-11-24 ENCOUNTER — Telehealth: Payer: Self-pay

## 2022-11-24 LAB — 10-HYDROXYCARBAZEPINE: Triliptal/MTB(Oxcarbazepin): 2 ug/mL — ABNORMAL LOW (ref 10–35)

## 2022-11-24 NOTE — Transitions of Care (Post Inpatient/ED Visit) (Signed)
11/24/2022  Name: Mason Blackburn MRN: [id number] DOB: 1945/07/22  Today's TOC FU Call Status: Today's TOC FU Call Status:: Successful TOC FU Call Completed TOC FU Call Complete Date: 11/24/22 Patient's Name and Date of Birth confirmed.  Transition Care Management Follow-up Telephone Call Date of Discharge: 11/21/22 Discharge Facility: Mason Blackburn ([facility]) Type of Discharge: Inpatient Admission Primary Inpatient Discharge Diagnosis:: Seizure How have you been since you were released from the hospital?: Same (Per patients daughter in law he is still drinking and smoking.  She went over discharge instructions with patient and highlighted the phone number to schedule follow up appt as she is not making any more appointments for patient due to noncompliance) Any questions or concerns?: No  Items Reviewed: Did you receive and understand the discharge instructions provided?: Yes Medications obtained,verified, and reconciled?:  (Daughter in law and son set up meds in a pill box every weekend) Any new allergies since your discharge?: No Dietary orders reviewed?: No Do you have support at home?: No  Medications Reviewed Today: Medications Reviewed Today     Reviewed by Jodelle Gross, RN (Case Manager) on 11/24/22 at 1250  Med List Status: <None>   Medication Order Taking? Sig Documenting Provider Last Dose Status Informant  cholecalciferol (VITAMIN D3) 10 MCG/ML LIQD oral liquid [id number] Yes Take 8-10 mLs by mouth in the morning and at bedtime. 8 ml in the morning and 10 ml at bedtime. [provider] Taking Active Self  OXcarbazepine (TRILEPTAL) 300 MG/5ML suspension [id number]  Take 5-10 mLs (300-600 mg total) by mouth 2 (two) times daily for 5 days. Take 300 mg (5 ml) in the morning and Take 600 mg (10 ml) in the evening  Patient taking differently: Take 300-600 mg by mouth See admin instructions. Take 300 mg (5 ml) in the morning and Take 600 mg (10 ml) in the evening    Rhetta Mura, MD  Expired 11/19/22 2359 Self           Med Note Jola Schmidt   Fri Mar 26, 2022  8:40 AM)    potassium chloride (KLOR-CON M10) 10 MEQ tablet [id number] Yes TAKE 1 TABLET BY MOUTH 2 TIMES DAILY. Gabriel Earing, FNP Taking Active Self           Med Note Elesa Massed, Illene Labrador   Fri Nov 19, 2022  3:42 PM) Due to pt notes saying pt cannot swallow, unsure if he was remembering this medication correctly given it's a big pill to swallow.  silodosin (RAPAFLO) 8 MG CAPS capsule [id number] Yes Take 8 mg by mouth daily. [provider] Taking Active Self            Home Care and Equipment/Supplies: Were Home Health Services Ordered?: No Any new equipment or medical supplies ordered?: No  Functional Questionnaire: Do you need assistance with bathing/showering or dressing?: No Do you need assistance with meal preparation?: No Do you need assistance with eating?: No Do you have difficulty maintaining continence: No Do you need assistance with getting out of bed/getting out of a chair/moving?: No Do you have difficulty managing or taking your medications?: No  Follow up appointments reviewed: PCP Follow-up appointment confirmed?: No MD Provider Line Number:[phone] Given: No (Patient has PCP office number to call and make appointment) Specialist Hospital Follow-up appointment confirmed?: NA Do you need transportation to your follow-up appointment?: No Do you understand care options if your condition(s) worsen?: Yes-patient verbalized understanding  SDOH Interventions Today    Flowsheet Row  Most Recent Value  SDOH Interventions   Food Insecurity Interventions Intervention Not Indicated  Housing Interventions Intervention Not Indicated     Jodelle Gross RN, BSN, CCM [facility] RN Care Coordinator/ Transitions of Care Direct Dial: [phone]  Fax: [phone]

## 2023-03-10 ENCOUNTER — Inpatient Hospital Stay (HOSPITAL_COMMUNITY)
Admission: EM | Admit: 2023-03-10 | Discharge: 2023-03-13 | DRG: 070 | Disposition: A | Discharge status: Hospice | Payer: Medicare Other | Attending: Internal Medicine | Admitting: Internal Medicine

## 2023-03-10 ENCOUNTER — Encounter (HOSPITAL_COMMUNITY): Payer: Self-pay | Admitting: Emergency Medicine

## 2023-03-10 ENCOUNTER — Other Ambulatory Visit: Payer: Self-pay

## 2023-03-10 ENCOUNTER — Emergency Department (HOSPITAL_COMMUNITY): Payer: Medicare Other

## 2023-03-10 DIAGNOSIS — Z811 Family history of alcohol abuse and dependence: Secondary | ICD-10-CM

## 2023-03-10 DIAGNOSIS — F17200 Nicotine dependence, unspecified, uncomplicated: Secondary | ICD-10-CM | POA: Diagnosis present

## 2023-03-10 DIAGNOSIS — Z79899 Other long term (current) drug therapy: Secondary | ICD-10-CM

## 2023-03-10 DIAGNOSIS — R17 Unspecified jaundice: Secondary | ICD-10-CM

## 2023-03-10 DIAGNOSIS — E8809 Other disorders of plasma-protein metabolism, not elsewhere classified: Secondary | ICD-10-CM | POA: Diagnosis present

## 2023-03-10 DIAGNOSIS — F17210 Nicotine dependence, cigarettes, uncomplicated: Secondary | ICD-10-CM | POA: Diagnosis present

## 2023-03-10 DIAGNOSIS — F419 Anxiety disorder, unspecified: Secondary | ICD-10-CM | POA: Diagnosis present

## 2023-03-10 DIAGNOSIS — Z818 Family history of other mental and behavioral disorders: Secondary | ICD-10-CM

## 2023-03-10 DIAGNOSIS — E872 Acidosis, unspecified: Secondary | ICD-10-CM | POA: Diagnosis present

## 2023-03-10 DIAGNOSIS — N319 Neuromuscular dysfunction of bladder, unspecified: Secondary | ICD-10-CM | POA: Diagnosis present

## 2023-03-10 DIAGNOSIS — F1010 Alcohol abuse, uncomplicated: Secondary | ICD-10-CM | POA: Diagnosis present

## 2023-03-10 DIAGNOSIS — I868 Varicose veins of other specified sites: Secondary | ICD-10-CM | POA: Diagnosis present

## 2023-03-10 DIAGNOSIS — N200 Calculus of kidney: Secondary | ICD-10-CM | POA: Diagnosis present

## 2023-03-10 DIAGNOSIS — G9341 Metabolic encephalopathy: Secondary | ICD-10-CM | POA: Diagnosis not present

## 2023-03-10 DIAGNOSIS — R188 Other ascites: Secondary | ICD-10-CM

## 2023-03-10 DIAGNOSIS — K831 Obstruction of bile duct: Secondary | ICD-10-CM | POA: Diagnosis present

## 2023-03-10 DIAGNOSIS — Z9842 Cataract extraction status, left eye: Secondary | ICD-10-CM

## 2023-03-10 DIAGNOSIS — D539 Nutritional anemia, unspecified: Secondary | ICD-10-CM | POA: Diagnosis present

## 2023-03-10 DIAGNOSIS — Z6820 Body mass index (BMI) 20.0-20.9, adult: Secondary | ICD-10-CM

## 2023-03-10 DIAGNOSIS — Z8701 Personal history of pneumonia (recurrent): Secondary | ICD-10-CM

## 2023-03-10 DIAGNOSIS — R7401 Elevation of levels of liver transaminase levels: Secondary | ICD-10-CM | POA: Insufficient documentation

## 2023-03-10 DIAGNOSIS — N320 Bladder-neck obstruction: Secondary | ICD-10-CM | POA: Diagnosis present

## 2023-03-10 DIAGNOSIS — E440 Moderate protein-calorie malnutrition: Secondary | ICD-10-CM | POA: Diagnosis present

## 2023-03-10 DIAGNOSIS — R569 Unspecified convulsions: Secondary | ICD-10-CM

## 2023-03-10 DIAGNOSIS — Z923 Personal history of irradiation: Secondary | ICD-10-CM

## 2023-03-10 DIAGNOSIS — Z8509 Personal history of malignant neoplasm of other digestive organs: Secondary | ICD-10-CM

## 2023-03-10 DIAGNOSIS — R4182 Altered mental status, unspecified: Secondary | ICD-10-CM | POA: Diagnosis not present

## 2023-03-10 DIAGNOSIS — Z515 Encounter for palliative care: Secondary | ICD-10-CM

## 2023-03-10 DIAGNOSIS — R180 Malignant ascites: Secondary | ICD-10-CM | POA: Diagnosis present

## 2023-03-10 DIAGNOSIS — E46 Unspecified protein-calorie malnutrition: Secondary | ICD-10-CM

## 2023-03-10 DIAGNOSIS — E876 Hypokalemia: Secondary | ICD-10-CM | POA: Diagnosis present

## 2023-03-10 DIAGNOSIS — G40909 Epilepsy, unspecified, not intractable, without status epilepticus: Secondary | ICD-10-CM | POA: Diagnosis present

## 2023-03-10 DIAGNOSIS — Z9841 Cataract extraction status, right eye: Secondary | ICD-10-CM

## 2023-03-10 DIAGNOSIS — Z66 Do not resuscitate: Secondary | ICD-10-CM | POA: Diagnosis present

## 2023-03-10 DIAGNOSIS — C801 Malignant (primary) neoplasm, unspecified: Secondary | ICD-10-CM | POA: Diagnosis present

## 2023-03-10 DIAGNOSIS — Z87820 Personal history of traumatic brain injury: Secondary | ICD-10-CM

## 2023-03-10 DIAGNOSIS — I864 Gastric varices: Secondary | ICD-10-CM | POA: Diagnosis present

## 2023-03-10 DIAGNOSIS — I700 Atherosclerosis of aorta: Secondary | ICD-10-CM | POA: Diagnosis present

## 2023-03-10 DIAGNOSIS — I81 Portal vein thrombosis: Secondary | ICD-10-CM | POA: Diagnosis present

## 2023-03-10 DIAGNOSIS — N400 Enlarged prostate without lower urinary tract symptoms: Secondary | ICD-10-CM | POA: Diagnosis present

## 2023-03-10 DIAGNOSIS — Z87440 Personal history of urinary (tract) infections: Secondary | ICD-10-CM

## 2023-03-10 DIAGNOSIS — C786 Secondary malignant neoplasm of retroperitoneum and peritoneum: Secondary | ICD-10-CM | POA: Diagnosis present

## 2023-03-10 DIAGNOSIS — I8500 Esophageal varices without bleeding: Secondary | ICD-10-CM | POA: Diagnosis present

## 2023-03-10 DIAGNOSIS — I10 Essential (primary) hypertension: Secondary | ICD-10-CM | POA: Diagnosis present

## 2023-03-10 DIAGNOSIS — C221 Intrahepatic bile duct carcinoma: Secondary | ICD-10-CM | POA: Diagnosis present

## 2023-03-10 LAB — CBC WITH DIFFERENTIAL/PLATELET
Abs Immature Granulocytes: 0.05 10*3/uL (ref 0.00–0.07)
Basophils Absolute: 0.1 10*3/uL (ref 0.0–0.1)
Basophils Relative: 1 %
Eosinophils Absolute: 0.2 10*3/uL (ref 0.0–0.5)
Eosinophils Relative: 1 %
HCT: 32.7 % — ABNORMAL LOW (ref 39.0–52.0)
Hemoglobin: 11.3 g/dL — ABNORMAL LOW (ref 13.0–17.0)
Immature Granulocytes: 0 %
Lymphocytes Relative: 8 %
Lymphs Abs: 1 10*3/uL (ref 0.7–4.0)
MCH: 37.5 pg — ABNORMAL HIGH (ref 26.0–34.0)
MCHC: 34.6 g/dL (ref 30.0–36.0)
MCV: 108.6 fL — ABNORMAL HIGH (ref 80.0–100.0)
Monocytes Absolute: 1 10*3/uL (ref 0.1–1.0)
Monocytes Relative: 8 %
Neutro Abs: 10.6 10*3/uL — ABNORMAL HIGH (ref 1.7–7.7)
Neutrophils Relative %: 82 %
Platelets: 352 10*3/uL (ref 150–400)
RBC: 3.01 MIL/uL — ABNORMAL LOW (ref 4.22–5.81)
RDW: 14.8 % (ref 11.5–15.5)
WBC: 13 10*3/uL — ABNORMAL HIGH (ref 4.0–10.5)
nRBC: 0 % (ref 0.0–0.2)

## 2023-03-10 LAB — COMPREHENSIVE METABOLIC PANEL
ALT: 46 U/L — ABNORMAL HIGH (ref 0–44)
AST: 71 U/L — ABNORMAL HIGH (ref 15–41)
Albumin: 2.2 g/dL — ABNORMAL LOW (ref 3.5–5.0)
Alkaline Phosphatase: 833 U/L — ABNORMAL HIGH (ref 38–126)
Anion gap: 9 (ref 5–15)
BUN: 18 mg/dL (ref 8–23)
CO2: 23 mmol/L (ref 22–32)
Calcium: 8.9 mg/dL (ref 8.9–10.3)
Chloride: 105 mmol/L (ref 98–111)
Creatinine, Ser: 1.21 mg/dL (ref 0.61–1.24)
GFR, Estimated: >60 mL/min (ref >60)
Glucose, Bld: 117 mg/dL — ABNORMAL HIGH (ref 70–99)
Potassium: 3.2 mmol/L — ABNORMAL LOW (ref 3.5–5.1)
Sodium: 137 mmol/L (ref 135–145)
Total Bilirubin: 9.7 mg/dL — ABNORMAL HIGH (ref <1.2)
Total Protein: 6.8 g/dL (ref 6.5–8.1)

## 2023-03-10 LAB — LACTIC ACID, PLASMA
Lactic Acid, Venous: 2 mmol/L (ref 0.5–1.9)
Lactic Acid, Venous: 2.3 mmol/L (ref 0.5–1.9)

## 2023-03-10 LAB — LIPASE, BLOOD: Lipase: 38 U/L (ref 11–51)

## 2023-03-10 LAB — AMMONIA: Ammonia: 35 umol/L (ref 9–35)

## 2023-03-10 LAB — ETHANOL: Alcohol, Ethyl (B): <10 mg/dL (ref <10)

## 2023-03-10 LAB — CBG MONITORING, ED: Glucose-Capillary: 110 mg/dL — ABNORMAL HIGH (ref 70–99)

## 2023-03-10 MED ORDER — SODIUM CHLORIDE 0.9 % IV BOLUS
1000.0000 mL | Freq: Once | INTRAVENOUS | Status: AC
  Administered 2023-03-10: 1000 mL via INTRAVENOUS

## 2023-03-10 MED ORDER — IOHEXOL 300 MG/ML  SOLN
100.0000 mL | Freq: Once | INTRAMUSCULAR | Status: AC | PRN
  Administered 2023-03-10: 100 mL via INTRAVENOUS

## 2023-03-10 MED ORDER — ONDANSETRON HCL 4 MG/2ML IJ SOLN
4.0000 mg | Freq: Once | INTRAMUSCULAR | Status: AC
  Administered 2023-03-10: 4 mg via INTRAVENOUS
  Filled 2023-03-10: qty 2

## 2023-03-10 MED ORDER — SODIUM CHLORIDE 0.9 % IV SOLN: INTRAVENOUS | Status: DC

## 2023-03-10 NOTE — ED Provider Notes (Addendum)
[place] EMERGENCY DEPARTMENT AT [facility] Provider Note   CSN: [id number] Arrival date & time: 03/10/23  2054     History  Chief Complaint  Patient presents with   Altered Mental Status   Seizures    Mason Blackburn is a 77 y.o. male.  Patient brought in by EMS.  Bystanders noticed that he was confused thought maybe it had a seizure he has a history of seizures.  Patient not able to provide much information here says confused about what medications he supposed to take.  Patient appearing very jaundiced.  Appears very thin.  Patient was last admitted August 30 through September 1 of this year.  That note states that he has a history of cholangiocarcinoma alcohol abuse traumatic brain injury seizure disorder.  Patient is supposed to be on Trileptal.  However his EEGs have not shown any elliptic form discharges.  Patient originally diagnosed with cholangiocarcinoma in 2021.  Seen by hematology oncology last visit was July 2022.  Patient was not a good candidate for any surgical approaches so patient had radiation treatment in 2021.       Home Medications Prior to Admission medications   Medication Sig Start Date End Date Taking? Authorizing Provider  finasteride (PROSCAR) 5 MG tablet Take 5 mg by mouth daily. 01/01/23  Yes [provider]  cholecalciferol (VITAMIN D3) 10 MCG/ML LIQD oral liquid Take 8-10 mLs by mouth in the morning and at bedtime. 8 ml in the morning and 10 ml at bedtime.    [provider]  OXcarbazepine (TRILEPTAL) 300 MG/5ML suspension Take 5-10 mLs (300-600 mg total) by mouth 2 (two) times daily for 5 days. Take 300 mg (5 ml) in the morning and Take 600 mg (10 ml) in the evening Patient taking differently: Take 300-600 mg by mouth See admin instructions. Take 300 mg (5 ml) in the morning and Take 600 mg (10 ml) in the evening 08/21/19 11/19/22  Rhetta Mura, MD  potassium chloride (KLOR-CON M10) 10 MEQ tablet TAKE 1 TABLET  BY MOUTH 2 TIMES DAILY. 11/15/22   Gabriel Earing, FNP  silodosin (RAPAFLO) 8 MG CAPS capsule Take 8 mg by mouth daily. 10/23/22   [provider]      Allergies    Patient has no known allergies.    Review of Systems   Review of Systems  Unable to perform ROS: Mental status change  Psychiatric/Behavioral:  Positive for confusion.     Physical Exam Updated Vital Signs BP (!) 118/105   Pulse (!) 114   Temp 98.2 F (36.8 C) (Oral)   Resp (!) 31   Ht 1.727 m (5\' 8" )   Wt 60 kg   SpO2 95%   BMI 20.11 kg/m  Physical Exam Vitals and nursing note reviewed.  Constitutional:      General: He is not in acute distress.    Appearance: He is well-developed. He is ill-appearing.  HENT:     Head: Normocephalic and atraumatic.  Eyes:     General: Scleral icterus present.     Extraocular Movements: Extraocular movements intact.     Conjunctiva/sclera: Conjunctivae normal.     Pupils: Pupils are equal, round, and reactive to light.  Cardiovascular:     Rate and Rhythm: Normal rate and regular rhythm.     Heart sounds: No murmur heard. Pulmonary:     Effort: Pulmonary effort is normal. No respiratory distress.     Breath sounds: Normal breath sounds.  Abdominal:  Palpations: Abdomen is soft.     Tenderness: There is no abdominal tenderness.  Musculoskeletal:        General: No swelling.     Cervical back: Neck supple.  Skin:    General: Skin is warm and dry.     Capillary Refill: Capillary refill takes less than 2 seconds.     Coloration: Skin is jaundiced.  Neurological:     Mental Status: He is alert.     Comments: Patient awake and will follow commands and will talk some.  But appears confused.  No obvious focal deficits.  Psychiatric:        Mood and Affect: Mood normal.     ED Results / Procedures / Treatments   Labs (all labs ordered are listed, but only abnormal results are displayed) Labs Reviewed  CBC WITH DIFFERENTIAL/PLATELET - Abnormal; Notable  for the following components:      Result Value   WBC 13.0 (*)    RBC 3.01 (*)    Hemoglobin 11.3 (*)    HCT 32.7 (*)    MCV 108.6 (*)    MCH 37.5 (*)    Neutro Abs 10.6 (*)    All other components within normal limits  COMPREHENSIVE METABOLIC PANEL - Abnormal; Notable for the following components:   Potassium 3.2 (*)    Glucose, Bld 117 (*)    Albumin 2.2 (*)    AST 71 (*)    ALT 46 (*)    Alkaline Phosphatase 833 (*)    Total Bilirubin 9.7 (*)    All other components within normal limits  CBG MONITORING, ED - Abnormal; Notable for the following components:   Glucose-Capillary 110 (*)    All other components within normal limits  CULTURE, BLOOD (ROUTINE X 2)  CULTURE, BLOOD (ROUTINE X 2)  LIPASE, BLOOD  ETHANOL  AMMONIA  URINALYSIS, ROUTINE W REFLEX MICROSCOPIC  LACTIC ACID, PLASMA  LACTIC ACID, PLASMA    EKG EKG Interpretation Date/Time:  Thursday March 10 2023 21:10:32 EST Ventricular Rate:  121 PR Interval:  125 QRS Duration:  91 QT Interval:  336 QTC Calculation: 477 R Axis:   18  Text Interpretation: Sinus tachycardia Multiform ventricular premature complexes Aberrant complex Borderline T abnormalities, inferior leads Borderline prolonged QT interval Confirmed by Vanetta Mulders [phone]) on 03/10/2023 10:01:10 PM  Radiology No results found.  Procedures Procedures    Medications Ordered in ED Medications  sodium chloride 0.9 % bolus 1,000 mL (has no administration in time range)  0.9 %  sodium chloride infusion (has no administration in time range)    ED Course/ Medical Decision Making/ A&P                                 Medical Decision Making Amount and/or Complexity of Data Reviewed Labs: ordered. Radiology: ordered.  Risk Prescription drug management.   Patient's white blood cell count is 13 hemoglobin 11 platelets 352 that is reassuring.  Great metabolic panel is pending lipase is pending ammonia levels pending ethanol levels  pending lactic acids pending blood cultures have been ordered.  Patient's glucose was 110.  Portable chest x-ray pending.  EKG seems to be consistent with sinus tachycardia.  Complete metabolic panel is now back.  Potassium 3.2 renal function very good GFR greater than 60.  LFTs AST 71 ALT 46 alk phos 83 total bili 9.7 that we will go along with him appearing very  jaundiced.  Anion gap is 9 lipase is 38 alcohol less than 10 ammonia level is 35.  Based on this we will go ahead and get CT scan abdomen pelvis and CT head.  CRITICAL CARE Performed by: Vanetta Mulders Total critical care time: 40 minutes Critical care time was exclusive of separately billable procedures and treating other patients. Critical care was necessary to treat or prevent imminent or life-threatening deterioration. Critical care was time spent personally by me on the following activities: development of treatment plan with patient and/or surrogate as well as nursing, discussions with consultants, evaluation of patient's response to treatment, examination of patient, obtaining history from patient or surrogate, ordering and performing treatments and interventions, ordering and review of laboratory studies, ordering and review of radiographic studies, pulse oximetry and re-evaluation of patient's condition.  Patient's vital signs here he is tachycardic temp 98.2 blood pressures are little soft with systolics at 95.  Oxygen saturation 94%.  On room air.  Patient turned over to overnight ED physician patient has CT head and CT abdomen and pelvis pending.  Chest x-ray reviewed by me nothing significantly acute on that.  But not formally read yet.  Final Clinical Impression(s) / ED Diagnoses Final diagnoses:  Altered mental status, unspecified altered mental status type  Jaundice    Rx / DC Orders ED Discharge Orders     None         Vanetta Mulders, MD 03/10/23 2203    Vanetta Mulders, MD 03/10/23 2320

## 2023-03-10 NOTE — ED Triage Notes (Signed)
Patient from home, found by bystanders inside his home acting altered, maybe had a seizure, however did not note any tonic clonic activity. Patient does have a hx of same. Per EMS patient was Aox2. Unable to recall the last time he ate or drank anything. CBG 138 HR 112 BP 110/60, 96% on RA.

## 2023-03-10 NOTE — ED Provider Notes (Signed)
  Provider Note MRN:  [id number]  Arrival date & time: 03/11/23    ED Course and Medical Decision Making  I assumed care of the patient at shift change.  Altered mental status with somnolence, significant jaundice, history of cholangiocarcinoma awaiting CT.  2:45 AM update: CT revealed biliary obstruction likely due to tumor/cholangiocarcinoma.  Case discussed both with Dr. Tasia Catchings and Dr. Tomasa Rand of gastroenterology.  Patient is doing much better clinically, he is awake and alert, he denies abdominal pain, he does not have a tender abdomen, he is not had a fever and so low concern for active cholangitis at this time.  Plan is for admission to the [facility] hospitalist service with GI following for ERCP.  Patient's oral temp is 99.2, mild tachycardia, still without any abdominal pain or tenderness, an abundance of caution providing patient with IV Zosyn, further fluid resuscitation.  .Critical Care  Performed by: Sabas Sous, MD Authorized by: Sabas Sous, MD   Critical care provider statement:    Critical care time (minutes):  85   Critical care was time spent personally by me on the following activities:  Development of treatment plan with patient or surrogate, discussions with consultants, evaluation of patient's response to treatment, examination of patient, ordering and review of laboratory studies, ordering and review of radiographic studies, ordering and performing treatments and interventions, pulse oximetry, re-evaluation of patient's condition and review of old charts   Final Clinical Impressions(s) / ED Diagnoses     ICD-10-CM   1. Altered mental status, unspecified altered mental status type  R41.82     2. Jaundice  R17     3. Biliary obstruction due to cancer [facility])  K83.1    C80.1       ED Discharge Orders     None       Discharge Instructions   None     Elmer Sow. Pilar Plate, MD [facility]  Health [email or web address]    Sabas Sous, MD 03/11/23 [phone]

## 2023-03-11 ENCOUNTER — Inpatient Hospital Stay (HOSPITAL_COMMUNITY): Payer: Medicare Other

## 2023-03-11 DIAGNOSIS — R7401 Elevation of levels of liver transaminase levels: Secondary | ICD-10-CM | POA: Insufficient documentation

## 2023-03-11 DIAGNOSIS — I81 Portal vein thrombosis: Secondary | ICD-10-CM | POA: Diagnosis present

## 2023-03-11 DIAGNOSIS — C801 Malignant (primary) neoplasm, unspecified: Secondary | ICD-10-CM | POA: Diagnosis present

## 2023-03-11 DIAGNOSIS — F1010 Alcohol abuse, uncomplicated: Secondary | ICD-10-CM | POA: Diagnosis present

## 2023-03-11 DIAGNOSIS — Z923 Personal history of irradiation: Secondary | ICD-10-CM | POA: Diagnosis not present

## 2023-03-11 DIAGNOSIS — N400 Enlarged prostate without lower urinary tract symptoms: Secondary | ICD-10-CM | POA: Diagnosis present

## 2023-03-11 DIAGNOSIS — Z515 Encounter for palliative care: Secondary | ICD-10-CM | POA: Diagnosis not present

## 2023-03-11 DIAGNOSIS — C786 Secondary malignant neoplasm of retroperitoneum and peritoneum: Secondary | ICD-10-CM | POA: Diagnosis present

## 2023-03-11 DIAGNOSIS — R188 Other ascites: Secondary | ICD-10-CM | POA: Diagnosis not present

## 2023-03-11 DIAGNOSIS — K831 Obstruction of bile duct: Secondary | ICD-10-CM | POA: Diagnosis present

## 2023-03-11 DIAGNOSIS — D539 Nutritional anemia, unspecified: Secondary | ICD-10-CM | POA: Diagnosis present

## 2023-03-11 DIAGNOSIS — R4182 Altered mental status, unspecified: Secondary | ICD-10-CM | POA: Diagnosis present

## 2023-03-11 DIAGNOSIS — Z87440 Personal history of urinary (tract) infections: Secondary | ICD-10-CM | POA: Diagnosis not present

## 2023-03-11 DIAGNOSIS — C221 Intrahepatic bile duct carcinoma: Secondary | ICD-10-CM | POA: Diagnosis present

## 2023-03-11 DIAGNOSIS — E872 Acidosis, unspecified: Secondary | ICD-10-CM | POA: Diagnosis present

## 2023-03-11 DIAGNOSIS — I700 Atherosclerosis of aorta: Secondary | ICD-10-CM | POA: Diagnosis present

## 2023-03-11 DIAGNOSIS — R180 Malignant ascites: Secondary | ICD-10-CM | POA: Diagnosis present

## 2023-03-11 DIAGNOSIS — I8500 Esophageal varices without bleeding: Secondary | ICD-10-CM | POA: Diagnosis present

## 2023-03-11 DIAGNOSIS — E8809 Other disorders of plasma-protein metabolism, not elsewhere classified: Secondary | ICD-10-CM | POA: Insufficient documentation

## 2023-03-11 DIAGNOSIS — Z87820 Personal history of traumatic brain injury: Secondary | ICD-10-CM | POA: Diagnosis not present

## 2023-03-11 DIAGNOSIS — F17210 Nicotine dependence, cigarettes, uncomplicated: Secondary | ICD-10-CM | POA: Diagnosis present

## 2023-03-11 DIAGNOSIS — Z7189 Other specified counseling: Secondary | ICD-10-CM | POA: Diagnosis not present

## 2023-03-11 DIAGNOSIS — Z8509 Personal history of malignant neoplasm of other digestive organs: Secondary | ICD-10-CM | POA: Diagnosis not present

## 2023-03-11 DIAGNOSIS — Z66 Do not resuscitate: Secondary | ICD-10-CM | POA: Diagnosis present

## 2023-03-11 DIAGNOSIS — G9341 Metabolic encephalopathy: Secondary | ICD-10-CM | POA: Diagnosis present

## 2023-03-11 DIAGNOSIS — E876 Hypokalemia: Secondary | ICD-10-CM | POA: Diagnosis present

## 2023-03-11 DIAGNOSIS — I10 Essential (primary) hypertension: Secondary | ICD-10-CM | POA: Diagnosis present

## 2023-03-11 DIAGNOSIS — E46 Unspecified protein-calorie malnutrition: Secondary | ICD-10-CM

## 2023-03-11 DIAGNOSIS — G40909 Epilepsy, unspecified, not intractable, without status epilepticus: Secondary | ICD-10-CM | POA: Diagnosis present

## 2023-03-11 DIAGNOSIS — E440 Moderate protein-calorie malnutrition: Secondary | ICD-10-CM | POA: Diagnosis present

## 2023-03-11 DIAGNOSIS — R17 Unspecified jaundice: Secondary | ICD-10-CM | POA: Diagnosis not present

## 2023-03-11 DIAGNOSIS — Z8701 Personal history of pneumonia (recurrent): Secondary | ICD-10-CM | POA: Diagnosis not present

## 2023-03-11 LAB — COMPREHENSIVE METABOLIC PANEL
ALT: 42 U/L (ref 0–44)
AST: 78 U/L — ABNORMAL HIGH (ref 15–41)
Albumin: 1.7 g/dL — ABNORMAL LOW (ref 3.5–5.0)
Alkaline Phosphatase: 667 U/L — ABNORMAL HIGH (ref 38–126)
Anion gap: 9 (ref 5–15)
BUN: 17 mg/dL (ref 8–23)
CO2: 20 mmol/L — ABNORMAL LOW (ref 22–32)
Calcium: 7.9 mg/dL — ABNORMAL LOW (ref 8.9–10.3)
Chloride: 109 mmol/L (ref 98–111)
Creatinine, Ser: 1.16 mg/dL (ref 0.61–1.24)
GFR, Estimated: >60 mL/min (ref >60)
Glucose, Bld: 93 mg/dL (ref 70–99)
Potassium: 3.6 mmol/L (ref 3.5–5.1)
Sodium: 138 mmol/L (ref 135–145)
Total Bilirubin: 8.3 mg/dL — ABNORMAL HIGH (ref <1.2)
Total Protein: 5.4 g/dL — ABNORMAL LOW (ref 6.5–8.1)

## 2023-03-11 LAB — URINALYSIS, ROUTINE W REFLEX MICROSCOPIC
Bacteria, UA: NONE SEEN
Bilirubin Urine: NEGATIVE
Glucose, UA: NEGATIVE mg/dL
Ketones, ur: NEGATIVE mg/dL
Leukocytes,Ua: NEGATIVE
Nitrite: NEGATIVE
Protein, ur: NEGATIVE mg/dL
Specific Gravity, Urine: 1.04 — ABNORMAL HIGH (ref 1.005–1.030)
pH: 5 (ref 5.0–8.0)

## 2023-03-11 LAB — PROTIME-INR
INR: 1.6 — ABNORMAL HIGH (ref 0.8–1.2)
Prothrombin Time: 19.1 s — ABNORMAL HIGH (ref 11.4–15.2)

## 2023-03-11 LAB — CBC WITH DIFFERENTIAL/PLATELET
Abs Immature Granulocytes: 0.1 10*3/uL — ABNORMAL HIGH (ref 0.00–0.07)
Basophils Absolute: 0.1 10*3/uL (ref 0.0–0.1)
Basophils Relative: 1 %
Eosinophils Absolute: 0.2 10*3/uL (ref 0.0–0.5)
Eosinophils Relative: 1 %
HCT: 27.9 % — ABNORMAL LOW (ref 39.0–52.0)
Hemoglobin: 9.5 g/dL — ABNORMAL LOW (ref 13.0–17.0)
Immature Granulocytes: 1 %
Lymphocytes Relative: 8 %
Lymphs Abs: 1.4 10*3/uL (ref 0.7–4.0)
MCH: 37.4 pg — ABNORMAL HIGH (ref 26.0–34.0)
MCHC: 34.1 g/dL (ref 30.0–36.0)
MCV: 109.8 fL — ABNORMAL HIGH (ref 80.0–100.0)
Monocytes Absolute: 1.5 10*3/uL — ABNORMAL HIGH (ref 0.1–1.0)
Monocytes Relative: 9 %
Neutro Abs: 13.8 10*3/uL — ABNORMAL HIGH (ref 1.7–7.7)
Neutrophils Relative %: 80 %
Platelets: 287 10*3/uL (ref 150–400)
RBC: 2.54 MIL/uL — ABNORMAL LOW (ref 4.22–5.81)
RDW: 14.9 % (ref 11.5–15.5)
WBC: 17.1 10*3/uL — ABNORMAL HIGH (ref 4.0–10.5)
nRBC: 0 % (ref 0.0–0.2)

## 2023-03-11 LAB — MRSA NEXT GEN BY PCR, NASAL: MRSA by PCR Next Gen: NOT DETECTED

## 2023-03-11 LAB — FOLATE: Folate: 8.1 ng/mL (ref >5.9)

## 2023-03-11 LAB — MAGNESIUM: Magnesium: 1.9 mg/dL (ref 1.7–2.4)

## 2023-03-11 LAB — LACTIC ACID, PLASMA
Lactic Acid, Venous: 1.8 mmol/L (ref 0.5–1.9)
Lactic Acid, Venous: 1.5 mmol/L (ref 0.5–1.9)

## 2023-03-11 LAB — VITAMIN B12: Vitamin B-12: 975 pg/mL — ABNORMAL HIGH (ref 180–914)

## 2023-03-11 MED ORDER — PIPERACILLIN-TAZOBACTAM 3.375 G IVPB 30 MIN
3.3750 g | Freq: Once | INTRAVENOUS | Status: AC
  Administered 2023-03-11: 3.375 g via INTRAVENOUS
  Filled 2023-03-11: qty 50

## 2023-03-11 MED ORDER — PNEUMOCOCCAL 20-VAL CONJ VACC 0.5 ML IM SUSY
0.5000 mL | PREFILLED_SYRINGE | INTRAMUSCULAR | Status: DC
  Administered 2023-03-12: 0.5 mL via INTRAMUSCULAR
  Filled 2023-03-11: qty 0.5

## 2023-03-11 MED ORDER — SODIUM CHLORIDE 0.9 % IV BOLUS
500.0000 mL | Freq: Once | INTRAVENOUS | Status: AC
  Administered 2023-03-11: 500 mL via INTRAVENOUS

## 2023-03-11 MED ORDER — ONDANSETRON HCL 4 MG/2ML IJ SOLN
4.0000 mg | Freq: Four times a day (QID) | INTRAMUSCULAR | Status: DC | PRN
Start: 2023-03-11 — End: 2023-03-13
  Administered 2023-03-13: 4 mg via INTRAVENOUS
  Filled 2023-03-11: qty 2

## 2023-03-11 MED ORDER — OXCARBAZEPINE 300 MG/5ML PO SUSP
600.0000 mg | Freq: Every day | ORAL | Status: DC
  Administered 2023-03-11 – 2023-03-12 (×2): 600 mg via ORAL
  Filled 2023-03-11 (×5): qty 10

## 2023-03-11 MED ORDER — NICOTINE 21 MG/24HR TD PT24
21.0000 mg | MEDICATED_PATCH | Freq: Every day | TRANSDERMAL | Status: DC
  Administered 2023-03-11 – 2023-03-13 (×3): 21 mg via TRANSDERMAL
  Filled 2023-03-11 (×3): qty 1

## 2023-03-11 MED ORDER — METHYLPREDNISOLONE SODIUM SUCC 40 MG IJ SOLR
40.0000 mg | Freq: Once | INTRAMUSCULAR | Status: AC
  Administered 2023-03-11: 40 mg via INTRAVENOUS
  Filled 2023-03-11: qty 1

## 2023-03-11 MED ORDER — ONDANSETRON HCL 4 MG PO TABS: 4.0000 mg | ORAL_TABLET | Freq: Four times a day (QID) | ORAL | Status: DC | PRN

## 2023-03-11 MED ORDER — ALBUMIN HUMAN 25 % IV SOLN
25.0000 g | Freq: Once | INTRAVENOUS | Status: AC
  Administered 2023-03-11: 25 g via INTRAVENOUS
  Filled 2023-03-11: qty 100

## 2023-03-11 MED ORDER — OXCARBAZEPINE 300 MG/5ML PO SUSP
300.0000 mg | Freq: Every day | ORAL | Status: DC
  Administered 2023-03-11 – 2023-03-12 (×2): 300 mg via ORAL
  Filled 2023-03-11 (×6): qty 5

## 2023-03-11 MED ORDER — IPRATROPIUM-ALBUTEROL 0.5-2.5 (3) MG/3ML IN SOLN
3.0000 mL | Freq: Three times a day (TID) | RESPIRATORY_TRACT | Status: DC
  Administered 2023-03-11 – 2023-03-12 (×4): 3 mL via RESPIRATORY_TRACT
  Filled 2023-03-11 (×4): qty 3

## 2023-03-11 MED ORDER — POTASSIUM CHLORIDE CRYS ER 20 MEQ PO TBCR
40.0000 meq | EXTENDED_RELEASE_TABLET | Freq: Once | ORAL | Status: AC
  Administered 2023-03-11: 40 meq via ORAL
  Filled 2023-03-11: qty 2

## 2023-03-11 MED ORDER — IBUPROFEN 400 MG PO TABS
400.0000 mg | ORAL_TABLET | Freq: Once | ORAL | Status: AC
  Administered 2023-03-11: 400 mg via ORAL
  Filled 2023-03-11: qty 1

## 2023-03-11 MED ORDER — SODIUM CHLORIDE 0.9 % IV BOLUS
1000.0000 mL | Freq: Once | INTRAVENOUS | Status: AC
  Administered 2023-03-11: 1000 mL via INTRAVENOUS

## 2023-03-11 MED ORDER — MOMETASONE FURO-FORMOTEROL FUM 200-5 MCG/ACT IN AERO
2.0000 | INHALATION_SPRAY | Freq: Two times a day (BID) | RESPIRATORY_TRACT | Status: DC
  Administered 2023-03-12 – 2023-03-13 (×2): 2 via RESPIRATORY_TRACT
  Filled 2023-03-11 (×2): qty 8.8

## 2023-03-11 MED ORDER — ENSURE ENLIVE PO LIQD
237.0000 mL | Freq: Two times a day (BID) | ORAL | Status: DC
  Administered 2023-03-12 – 2023-03-13 (×2): 237 mL via ORAL

## 2023-03-11 MED ORDER — GADOBUTROL 1 MMOL/ML IV SOLN
6.0000 mL | Freq: Once | INTRAVENOUS | Status: AC | PRN
  Administered 2023-03-11: 6 mL via INTRAVENOUS

## 2023-03-11 MED ORDER — PIPERACILLIN-TAZOBACTAM 3.375 G IVPB
3.3750 g | Freq: Three times a day (TID) | INTRAVENOUS | Status: DC
  Administered 2023-03-11 – 2023-03-13 (×7): 3.375 g via INTRAVENOUS
  Filled 2023-03-11 (×7): qty 50

## 2023-03-11 NOTE — Hospital Course (Addendum)
Taken from H&P.  Mason Blackburn is a 77 y.o. male with past medical history significant for seizure disorder failure TBI, neurogenic bladder/UTI follows [facility], tobacco/alcohol use, cholangiocarcinoma first diagnosed 2021 , stage Ib, had central liver mass 5.4 cm, CT chest that time multiple bilateral solid and nonsolid pulmonary nodules indeterminate cannot rule out metastasis, follow-up CT 2022 stable lung nodules..   Due to UTI, metabolic encephalopathy and sepsis patient was poor candidate for surgery/chemotherapy so had radiation 8/11-3 8/23 2021.  Not eligible for immunotherapy. Last follow-up with Dr. Mosetta Putt was 09/2020 Hospitalization 01/5 through 0 1/8 with urinary retention right upper quadrant abdominal pain, constipation found to have right lower lobe pneumonia VQ scan negative blood culture negative, E. coli UTI treated with Rocephin.   03/25/2022 CT abdomen pelvis with contrast for abdominal pain (admitted with right upper quadrant pain, found to have UTI/urinary retention, pneumonia) showed porta hepatis mass 4 cm without definite interval change, severe bilateral hydronephrosis and hydroureter with 1.3 cm stone left UVJ, urinary bladder wall thickening, enlarged prostate Patient admitted again the end of August into July with metabolic encephalopathy thought potentially postictal state versus alcohol intoxication/withdrawal positive alcohol during that visit.   Presents to the ER yesterday with altered mental status, significant jaundice at [facility], case was discussed with gastroenterology and they were recommending transferring to [facility] for MRCP and further evaluation.  CT abdomen and pelvis done earlier with interval diffuse biliary dilatation with development of large cystic densities throughout the liver, consistent with biliary obstruction. Poorly defined soft tissue mass at the porta hepatis consistent with given history of cholangiocarcinoma. 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 2. Moderate to large volume of abdominopelvic ascites. Gastroesophageal varices. 3. Thick-walled irregular urinary bladder with multiple diverticula consistent with chronic bladder outlet obstruction. Enlarged prostate. 4. Nonobstructing right kidney stone. 5. Aortic atherosclerosis.  MRCP pending, GI has evaluated him, not a surgical candidate but they will evaluate for a possible stent placement to see if that will help.  CT head unremarkable and ammonia normal.  12/21: Vital stable, MRCP with enlarging liver lesion significant for disease progression with new metastatic disease.  Also found to have large volume ascites, likely malignant, paracentesis was done earlier today with pending labs and cytology.  Patient with significant decline, palliative care was also consulted.  Lengthy discussion with family as patient is currently full code, no POA, son is willing to step up and to complete the paperwork.  Patient is appropriate for comfort focused care only with hospice help.  Labs with worsening liver function. Pending urine culture although UA was not very impressive for infection.  Patient was also started on Zosyn for concern of intra-abdominal infection which we will continue until peritoneal fluid cultures are back.  12/22: Patient with intermittent hypertension, started on midodrine after poor response to fluid boluses.  Some nausea and vomiting today. Not a candidate for any ERCP or other intervention, very limited life expectancy and poor prognosis. Peritoneal fluid culture remain negative on preliminary results.  Urine culture with insignificant growth, discontinuing antibiotics.   Does not look like patient has capacity to make decision as he cannot grasp his condition.  Patient is at terminal stages of his illness with a very limited life expectancy and grave prognosis.  Persistent nausea and vomiting despite Zofran and Phenergan.   Patient is becoming more fidgety.  Palliative care had a family meeting and he is being transitioned to full comfort care.  Patient is being discharged to inpatient hospice facility for end-of-life care.

## 2023-03-11 NOTE — Progress Notes (Signed)
No charge progress note.  Carder Below is a 77 y.o. male with past medical history significant for seizure disorder failure TBI, neurogenic bladder/UTI follows [facility], tobacco/alcohol use, cholangiocarcinoma first diagnosed 2021 , stage Ib, had central liver mass 5.4 cm, CT chest that time multiple bilateral solid and nonsolid pulmonary nodules indeterminate cannot rule out metastasis, follow-up CT 2022 stable lung nodules..   Due to UTI, metabolic encephalopathy and sepsis patient was poor candidate for surgery/chemotherapy so had radiation 8/11-3 8/23 2021.  Not eligible for immunotherapy. Last follow-up with Dr. Mosetta Putt was 09/2020 Hospitalization 01/5 through 0 1/8 with urinary retention right upper quadrant abdominal pain, constipation found to have right lower lobe pneumonia VQ scan negative blood culture negative, E. coli UTI treated with Rocephin.   03/25/2022 CT abdomen pelvis with contrast for abdominal pain (admitted with right upper quadrant pain, found to have UTI/urinary retention, pneumonia) showed porta hepatis mass 4 cm without definite interval change, severe bilateral hydronephrosis and hydroureter with 1.3 cm stone left UVJ, urinary bladder wall thickening, enlarged prostate Patient admitted again the end of August into July with metabolic encephalopathy thought potentially postictal state versus alcohol intoxication/withdrawal positive alcohol during that visit.   Presents to the ER yesterday with altered mental status, significant jaundice at [facility], case was discussed with gastroenterology and they were recommending transferring to [facility] for MRCP and further evaluation.  CT abdomen and pelvis done earlier with interval diffuse biliary dilatation with development of large cystic densities throughout the liver, consistent with biliary obstruction. Poorly defined soft tissue mass at the porta hepatis consistent with given history of cholangiocarcinoma.  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 2. Moderate to large volume of abdominopelvic ascites. Gastroesophageal varices. 3. Thick-walled irregular urinary bladder with multiple diverticula consistent with chronic bladder outlet obstruction. Enlarged prostate. 4. Nonobstructing right kidney stone. 5. Aortic atherosclerosis.  MRCP pending, GI has evaluated him, not a surgical candidate but they will evaluate for a possible stent placement to see if that will help.  CT head unremarkable and ammonia normal.  -Continuing current management and follow-up GI recommendations.

## 2023-03-11 NOTE — H&P (Signed)
History and Physical    Patient: Mason Blackburn NWG:[id number] DOB: March 27, 1945 DOA: 03/10/2023 DOS: the patient was seen and examined on 03/11/2023 PCP: Gabriel Earing, FNP  Patient coming from: Home  Chief Complaint:  Chief Complaint  Patient presents with   Altered Mental Status   Seizures   HPI: Mason Blackburn is a 77 y.o. male with medical history significant of seizure disorder, cholangiocarcinoma, alcohol abuse, tobacco abuse who presents to the emergency department from home via EMS due to confusion.  Patient's mental status have improved at bedside, he did not know exactly what happened that resulted to him coming to the ED, but he remembered a neighbor calling EMS, He remembers was EMS team around him (he was not sure of what happened between the time EMS was called and the arrival of the EMS team). At bedside, patient was alert and oriented x 3 and was asking if he could be discharged home since he was back to his baseline. Patient was recently admitted from 8/30 to 9/1 due to acute metabolic encephalopathy possibly due to postictal state and alcohol intoxication, seizures.  EEG that was done at that time showed no seizure or epileptiform discharges.  ED Course:  In the emergency department, patient was tachypneic, tachycardic, but other vital signs are within normal range.  Workup in the ED showed leukocytosis, macrocytic anemia.  BMP was normal except for potassium of 3.2, blood glucose 117, albumin 2.2.  AST 71, ALT 46, ALP 833, total bilirubin 9.7.  Alcohol level was less than 10, lipase 38, lactic acid 2.0 > 2.3, ammonia 35.  Blood culture pending. CT abdomen and pelvis with contrast showed interval diffuse biliary dilatation with development of large cystic densities throughout the liver, consistent with biliary obstruction.  Poorly defined soft tissue mass at the porta hepatis consistent with given history of cholangiocarcinoma.  Occlusion of the portal vein and  branch vessels by tumor.  Moderate to large volume of abdominal pelvic ascites.  Gastroesophageal varices. CT head without contrast showed no acute intracranial abnormality Chest x-ray showed no active disease Gastroenterologist on-call at [facility] was consulted due to possible need for patient to have an ERCP and recommended admitting patient to [facility].  Hospitalist was asked admit patient for further evaluation and management.  Review of Systems: Review of systems as noted in the HPI. All other systems reviewed and are negative.   Past Medical History:  Diagnosis Date   Alcohol abuse    Anxiety    Cancer (HCC)    Cataract    Enlarged prostate    Seizures (HCC)    Past Surgical History:  Procedure Laterality Date   CATARACT EXTRACTION, BILATERAL  2020   IR CATHETER TUBE CHANGE  10/03/2019    Social History:  reports that he has been smoking cigarettes. He has a 165 pack-year smoking history. He has never used smokeless tobacco. He reports current alcohol use of about 6.0 standard drinks of alcohol per week. He reports that he does not use drugs.   No Known Allergies  Family History  Problem Relation Age of Onset   Alcohol abuse Mother    Cancer Sister        breast   Alcohol abuse Brother    Anxiety disorder Son      Prior to Admission medications   Medication Sig Start Date End Date Taking? Authorizing Provider  finasteride (PROSCAR) 5 MG tablet Take 5 mg by mouth daily. 01/01/23  Yes [provider]  cholecalciferol (VITAMIN D3) 10 MCG/ML LIQD oral liquid Take 8-10 mLs by mouth in the morning and at bedtime. 8 ml in the morning and 10 ml at bedtime.    [provider]  OXcarbazepine (TRILEPTAL) 300 MG/5ML suspension Take 5-10 mLs (300-600 mg total) by mouth 2 (two) times daily for 5 days. Take 300 mg (5 ml) in the morning and Take 600 mg (10 ml) in the evening Patient taking differently: Take 300-600 mg by mouth See admin instructions. Take 300 mg (5 ml)  in the morning and Take 600 mg (10 ml) in the evening 08/21/19 11/19/22  Rhetta Mura, MD  potassium chloride (KLOR-CON M10) 10 MEQ tablet TAKE 1 TABLET BY MOUTH 2 TIMES DAILY. 11/15/22   Gabriel Earing, FNP  silodosin (RAPAFLO) 8 MG CAPS capsule Take 8 mg by mouth daily. 10/23/22   [provider]    Physical Exam: BP (!) 89/65   Pulse (!) 109   Temp 98.1 F (36.7 C) (Oral)   Resp (!) 22   Ht 5\' 8"  (1.727 m)   Wt 60 kg   SpO2 95%   BMI 20.11 kg/m   General: 77 y.o. year-old male well developed well nourished in no acute distress.  Alert and oriented x3. HEENT: NCAT, EOMI, scleral icterus Neck: Supple, trachea medial Cardiovascular: Regular rate and rhythm with no rubs or gallops.  No thyromegaly or JVD noted.  No lower extremity edema. 2/4 pulses in all 4 extremities. Respiratory: Clear to auscultation with no wheezes or rales. Good inspiratory effort. Abdomen: Soft, nontender nondistended with normal bowel sounds x4 quadrants. Muskuloskeletal: No cyanosis, clubbing or edema noted bilaterally Neuro: CN II-XII intact, strength 5/5 x 4, sensation, reflexes intact Skin: Jaundiced.  No ulcerative lesions noted or rashes Psychiatry: Judgement and insight appear normal. Mood is appropriate for condition and setting          Labs on Admission:  Basic Metabolic Panel: Recent Labs  Lab 03/10/23 2134  NA 137  K 3.2*  CL 105  CO2 23  GLUCOSE 117*  BUN 18  CREATININE 1.21  CALCIUM 8.9   Liver Function Tests: Recent Labs  Lab 03/10/23 2134  AST 71*  ALT 46*  ALKPHOS 833*  BILITOT 9.7*  PROT 6.8  ALBUMIN 2.2*   Recent Labs  Lab 03/10/23 2134  LIPASE 38   Recent Labs  Lab 03/10/23 2134  AMMONIA 35   CBC: Recent Labs  Lab 03/10/23 2134  WBC 13.0*  NEUTROABS 10.6*  HGB 11.3*  HCT 32.7*  MCV 108.6*  PLT 352   Cardiac Enzymes: No results for input(s): "CKTOTAL", "CKMB", "CKMBINDEX", "TROPONINI" in the last 168 hours.  BNP (last 3 results) No  results for input(s): "BNP" in the last 8760 hours.  ProBNP (last 3 results) No results for input(s): "PROBNP" in the last 8760 hours.  CBG: Recent Labs  Lab 03/10/23 2107  GLUCAP 110*    Radiological Exams on Admission: CT ABDOMEN PELVIS W CONTRAST Result Date: 03/11/2023 CLINICAL DATA:  Jaundice possible seizure history cholangiocarcinoma EXAM: CT ABDOMEN AND PELVIS WITH CONTRAST TECHNIQUE: Multidetector CT imaging of the abdomen and pelvis was performed using the standard protocol following bolus administration of intravenous contrast. RADIATION DOSE REDUCTION: This exam was performed according to the departmental dose-optimization program which includes automated exposure control, adjustment of the mA and/or kV according to patient size and/or use of iterative reconstruction technique. CONTRAST:  OMNIPAQUE IOHEXOL 300 MG/ML  SOLN COMPARISON:  CT 03/25/2022 FINDINGS: Lower chest:  Lung bases demonstrate emphysema. No acute airspace disease. Small hiatal hernia. Circumferential distal esophageal thickening. Hepatobiliary: Interval diffuse biliary dilatation with development of large cystic densities throughout the liver, on the right measuring 9.6 by 6.3 cm and on the left measuring 7 x 3.7 cm. Poorly defined soft tissue mass at the porta hepatis, measures approximately 5.3 x 3.1 cm on coronal series 5, image 47. Dilated common bile duct up to 11 mm. Pancreas: No inflammation.  Slight prominence of the proximal duct Spleen: Within normal limits for size Adrenals/Urinary Tract: Adrenal glands are normal. Kidneys show no hydronephrosis. Cyst midpole left kidney. Cortical scarring right kidney. Mild right renal atrophy. Nonobstructing right kidney stone. Thick-walled irregular urinary bladder with multiple diverticula. Stomach/Bowel: Stomach nonenlarged. No dilated small bowel. No acute bowel wall thickening. Vascular/Lymphatic: Moderate aortic atherosclerosis. No aneurysm. No suspicious lymph  nodes. 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 Reproductive: Enlarged prostate Other: No free air. Moderate to large volume of abdominopelvic ascites. Musculoskeletal: No acute or suspicious osseous abnormality. IMPRESSION: 1. Interval diffuse biliary dilatation with development of large cystic densities throughout the liver, consistent with biliary obstruction. Poorly defined soft tissue mass at the porta hepatis consistent with given history of cholangiocarcinoma. 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 2. Moderate to large volume of abdominopelvic ascites. Gastroesophageal varices. 3. Thick-walled irregular urinary bladder with multiple diverticula consistent with chronic bladder outlet obstruction. Enlarged prostate. 4. Nonobstructing right kidney stone. 5. Aortic atherosclerosis. Aortic Atherosclerosis (ICD10-I70.0) and Emphysema (ICD10-J43.9). Electronically Signed   By: Jasmine Pang M.D.   On: 03/11/2023 00:27   CT Head Wo Contrast Result Date: 03/10/2023 CLINICAL DATA:  Altered mental status EXAM: CT HEAD WITHOUT CONTRAST TECHNIQUE: Contiguous axial images were obtained from the base of the skull through the vertex without intravenous contrast. RADIATION DOSE REDUCTION: This exam was performed according to the departmental dose-optimization program which includes automated exposure control, adjustment of the mA and/or kV according to patient size and/or use of iterative reconstruction technique. COMPARISON:  05/07/2006 FINDINGS: Brain: No mass, hemorrhage or extra-axial collection. There is hypoattenuation of the white matter. CSF spaces are normal. Vascular: Negative Skull: Negative Sinuses/Orbits: Paranasal sinuses are clear. No mastoid effusion. Normal orbits. Other: None. IMPRESSION: 1. No acute intracranial abnormality. 2. Findings of chronic small vessel ischemia. Electronically Signed   By: Deatra Robinson M.D.   On: 03/10/2023 23:46    DG Chest Port 1 View Result Date: 03/10/2023 CLINICAL DATA:  Seizure jaundiced EXAM: PORTABLE CHEST 1 VIEW COMPARISON:  08/17/2019 FINDINGS: Stable cardiomediastinal silhouette. Aortic atherosclerotic calcification. Elevated right hemidiaphragm. Bibasilar atelectasis. No focal consolidation, pleural effusion, or pneumothorax. No displaced rib fractures. IMPRESSION: No active disease. Electronically Signed   By: Minerva Fester M.D.   On: 03/10/2023 23:36    EKG: I independently viewed the EKG done and my findings are as followed: Sinus tachycardia at a rate of 121 bpm  Assessment/Plan Present on Admission:  Acute metabolic encephalopathy  Biliary obstruction  Hypokalemia  TOBACCO ABUSE  Principal Problem:   Acute metabolic encephalopathy Active Problems:   TOBACCO ABUSE   Hypokalemia   Seizure (HCC)   Biliary obstruction   Lactic acidosis   Hypoalbuminemia due to protein-calorie malnutrition (HCC)   Transaminitis   Macrocytic anemia   History of cholangiocarcinoma  Biliary obstruction CT of abdomen and pelvis showed biliary obstruction Patient presents with jaundice Gastroenterologist Adolph Pollack) was consulted and recommended admitting patient to [facility] per EDP Continue  n.p.o. at this time  Acute metabolic encephalopathy - resolved  Hypokalemia K+ 3.2, this will be replenished  Lactic acidosis Lactic acid 2.0 > 2.3, continue IV hydration Continue to trend lactic acid  Hypoalbuminemia possibly secondary to moderate protein calorie malnutrition Albumin 2.2, consider starting patient on protein supplement when he resumes oral intake (currently n.p.o.)  Transaminitis possibly due to biliary obstruction secondary to known history of cholangiocarcinoma  AST 71, ALT 46, ALP 833 Continue to trend liver enzymes  Macrocytic anemia MCV 108.6, vitamin B12 and folate levels will be checked  Seizure Continue Trileptal  Tobacco abuse Patient was counseled on tobacco  abuse cessation  History of cholangiocarcinoma LFTs elevated, patient admitted to [facility] due to biliary obstruction and consult with gastroenterologist    DVT prophylaxis: SCDs  Code Status: Full code   Family Communication: None at bedside  Consults: Gastroenterology (by [facility] EDP)  Severity of Illness: The appropriate patient status for this patient is INPATIENT. Inpatient status is judged to be reasonable and necessary in order to provide the required intensity of service to ensure the patient's safety. The patient's presenting symptoms, physical exam findings, and initial radiographic and laboratory data in the context of their chronic comorbidities is felt to place them at high risk for further clinical deterioration. Furthermore, it is not anticipated that the patient will be medically stable for discharge from the hospital within 2 midnights of admission.   * I certify that at the point of admission it is my clinical judgment that the patient will require inpatient hospital care spanning beyond 2 midnights from the point of admission due to high intensity of service, high risk for further deterioration and high frequency of surveillance required.*  Author: Frankey Shown, DO 03/11/2023 6:21 AM  For on call review [email or web address].

## 2023-03-11 NOTE — ED Notes (Signed)
Notified Dr. Thomes Dinning of patient BP systolics in the 80s now. Ordered a 500 mls NS fluid bolus.

## 2023-03-11 NOTE — ED Notes (Signed)
Update provided to patient's son, Lenard Galloway, at patient's request.

## 2023-03-11 NOTE — Consult Note (Signed)
Consultation  Referring Provider:   [facility] Primary Care Physician:  Gabriel Earing, FNP Primary Gastroenterologist:  Gentry Fitz       Reason for Consultation:     Elevated total bili with leukocytosis in setting of cholangiocarcinoma DOA: 03/10/2023         Hospital Day: 2         HPI:   Mason Blackburn is a 77 y.o. male with past medical history significant for seizure disorder failure TBI, neurogenic bladder/UTI follows [facility], tobacco/alcohol use, cholangiocarcinoma first diagnosed 2021.  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   Due to UTI, metabolic encephalopathy and sepsis patient was poor candidate for surgery/chemotherapy so had radiation 8/11-3 8/23 2021.  Not eligible for immunotherapy. Last follow-up with Dr. Mosetta Putt was 09/2020 Hospitalization 01/5 through 0 1/8 with urinary retention right upper quadrant abdominal pain, constipation found to have right lower lobe pneumonia VQ scan negative blood culture negative, E. coli UTI treated with Rocephin.   03/25/2022 CT abdomen pelvis with contrast for abdominal pain (admitted with right upper quadrant pain, found to have UTI/urinary retention, pneumonia) showed porta hepatis mass 4 cm without definite interval change, severe bilateral hydronephrosis and hydroureter with 1.3 cm stone left UVJ, urinary bladder wall thickening, enlarged prostate Patient admitted again the end of August into July with metabolic encephalopathy thought potentially postictal state versus alcohol intoxication/withdrawal positive alcohol during that visit.  Presents to the ER yesterday with altered mental status, significant jaundice at [facility], transferred to [facility] for consideration of ERCP.   Work up notable for  Tachycardia,  tachypnea CT head with microvascular changes WBC 13-->17,  Lactic acid 2.0 and 2.3--> 1.5 Urine elevated specific gravity small hemoglobin but negative for leukocytes, bacteria hemoglobin 11.3, MCV elevated 108.  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 Patient was sleeping when I came to the room, very jaundiced states he knows he is in the hospital but uncertain which one and what brought him here unable to tell me year month date.  Very poor historian. Patient does state he normally takes seizure medications he has been running low uncertain if he has been taking them or not. Has not followed up with oncology since 2022, did not know he had cholangiocarcinoma when I mention it to them.  Did not know he was jaundice until he came to the hospital. Patient drinks beers mainly 3 to 5 days weekly last time was Wednesday he had 2 beers. Patient denies abdominal pain, nausea vomiting he has had dark urine and decreased urinary output with some dysuria.  Patient Nuys chest pain but states he has had a cough with mucus history  of smoking continues to smoke but has not had a cigarette for 2 days. Patient is uncertain of weight loss but has only been eating crackers and  drinking tea.  Denies dysphagia unable to cook for himself.   Abnormal ED labs: Abnormal Labs Reviewed  CBC WITH DIFFERENTIAL/PLATELET - Abnormal; Notable for the following components:      Result Value   WBC 13.0 (*)    RBC 3.01 (*)    Hemoglobin 11.3 (*)    HCT 32.7 (*)    MCV 108.6 (*)    MCH 37.5 (*)    Neutro Abs 10.6 (*)    All other components within normal limits  COMPREHENSIVE METABOLIC PANEL - Abnormal; Notable for the following components:   Potassium 3.2 (*)    Glucose, Bld 117 (*)    Albumin 2.2 (*)    AST 71 (*)    ALT 46 (*)    Alkaline Phosphatase 833 (*)    Total Bilirubin 9.7 (*)    All other components within normal limits  URINALYSIS, ROUTINE W REFLEX MICROSCOPIC - Abnormal; Notable for the following components:   Color, Urine AMBER (*)    Specific Gravity, Urine 1.040 (*)    Hgb urine dipstick SMALL (*)    All other components within normal limits  LACTIC ACID, PLASMA - Abnormal; Notable for the following components:   Lactic Acid, Venous 2.0 (*)    All other components within normal limits  LACTIC ACID, PLASMA - Abnormal; Notable for the following components:   Lactic Acid, Venous 2.3 (*)    All other components within normal limits  VITAMIN B12 - Abnormal; Notable for the following components:   Vitamin B-12 975 (*)    All other components within normal limits  COMPREHENSIVE METABOLIC PANEL - Abnormal; Notable for the following components:   CO2 20 (*)    Calcium 7.9 (*)    Total Protein 5.4 (*)    Albumin 1.7 (*)    AST 78 (*)    Alkaline Phosphatase 667 (*)    Total Bilirubin 8.3 (*)    All other components within normal limits  CBC WITH DIFFERENTIAL/PLATELET - Abnormal; Notable for the following components:   WBC 17.1 (*)    RBC 2.54 (*)    Hemoglobin 9.5 (*)    HCT 27.9 (*)    MCV 109.8 (*)    MCH 37.4 (*)    Neutro Abs 13.8 (*)    Monocytes Absolute 1.5 (*)    Abs Immature Granulocytes 0.10 (*)    All other components within normal  limits  CBG MONITORING, ED - Abnormal; Notable for the following components:   Glucose-Capillary 110 (*)    All other components within normal limits    Past Medical History:  Diagnosis Date   Alcohol abuse    Anxiety    Cancer (HCC)    Cataract    Enlarged prostate    Seizures (HCC)     Surgical History:  He  has a past surgical history that includes IR Catheter Tube Change (10/03/2019) and Cataract extraction, bilateral (2020). Family History:  His family history includes Alcohol abuse in his brother and mother; Anxiety disorder in his son; Cancer in his sister. Social History:   reports that he has been smoking cigarettes. He has a 165 pack-year smoking history. He has never used smokeless tobacco. He reports current alcohol use of about 6.0 standard drinks of alcohol per week. He reports that he does not use drugs.  Prior to Admission medications   Medication Sig Start Date End Date Taking? Authorizing Provider  finasteride (PROSCAR) 5 MG tablet Take 5 mg by mouth daily. 01/01/23  Yes [provider]  silodosin (RAPAFLO) 8 MG CAPS capsule Take 8 mg by mouth daily. 10/23/22  Yes [provider]  cholecalciferol (VITAMIN D3) 10 MCG/ML LIQD oral liquid Take 8-10 mLs by mouth in the morning and at bedtime. 8 ml in the morning and 10 ml at bedtime. Patient not taking: Reported on 03/11/2023    [provider]  OXcarbazepine (TRILEPTAL) 300 MG/5ML suspension Take 5-10 mLs (300-600 mg total) by mouth 2 (two) times daily for 5 days. Take 300 mg (5 ml) in the morning and Take 600 mg (10 ml) in the evening 08/21/19 03/11/23  Rhetta Mura, MD  potassium chloride (KLOR-CON M10) 10 MEQ tablet TAKE 1 TABLET BY MOUTH 2 TIMES DAILY. Patient not taking: Reported on 03/11/2023 11/15/22   Gabriel Earing, FNP    Current Facility-Administered Medications  Medication Dose Route Frequency Provider Last Rate Last Admin   0.9 %  sodium chloride infusion   Intravenous  Continuous Adefeso, Oladapo, DO 100 mL/hr at 03/11/23 0441 Restarted at 03/11/23 0441   ipratropium-albuterol (DUONEB) 0.5-2.5 (3) MG/3ML nebulizer solution 3 mL  3 mL Nebulization TID Rodolph Bong, MD       mometasone-formoterol [facility]) 200-5 MCG/ACT inhaler 2 puff  2 puff Inhalation BID Rodolph Bong, MD       nicotine (NICODERM CQ - dosed in mg/24 hours) patch 21 mg  21 mg Transdermal Daily Rodolph Bong, MD   21 mg at 03/11/23 1157   ondansetron (ZOFRAN) tablet 4 mg  4 mg Oral Q6H PRN Adefeso, Oladapo, DO       Or   ondansetron (ZOFRAN) injection 4 mg  4 mg Intravenous Q6H PRN Adefeso, Oladapo, DO       OXcarbazepine (TRILEPTAL) 300 MG/5ML suspension 300 mg  300 mg Oral Daily Adefeso, Oladapo, DO       OXcarbazepine (TRILEPTAL) 300 MG/5ML suspension 600 mg  600 mg Oral QHS Rodolph Bong, MD       piperacillin-tazobactam (ZOSYN) IVPB 3.375 g  3.375 g Intravenous Q8H Rodolph Bong, MD 12.5 mL/hr at 03/11/23 0944 3.375 g at 03/11/23 0944    Allergies as of 03/10/2023   (No Known Allergies)    Review of Systems:    Constitutional: No weight loss, fever, chills, weakness or fatigue HEENT: Eyes: No change in vision               Ears, Nose, Throat:  No change in hearing or congestion Skin: No rash or itching Cardiovascular: No chest pain, chest pressure or palpitations   Respiratory: No SOB or cough Gastrointestinal: See HPI and otherwise negative Genitourinary: No dysuria or change in urinary frequency Neurological: No headache, dizziness or syncope Musculoskeletal: No new muscle or joint pain Hematologic: No bleeding or bruising Psychiatric: No history of depression or anxiety     Physical Exam:  Vital signs in last 24 hours: Temp:  [97.7 F (36.5 C)-99.2 F (37.3 C)] 97.7 F (36.5 C) (12/20 1301) Pulse Rate:  [93-124] 99 (12/20 1301) Resp:  [16-33] 19 (12/20 1301) BP: (82-122)/(57-105) 106/77 (12/20 1301) SpO2:  [91 %-100 %] 98 % (12/20  1130) Weight:  [60 kg] 60 kg (12/19 2107)   Last BM recorded by nurses in past 5 days No data recorded  General:   Cachectic, chronically ill-appearing jaundiced  male in no acute distress Head:  Normocephalic and atraumatic.  Poor dentition Eyes: scleral icterus,conjunctive icteric  Heart:  regular rate and rhythm, no murmurs or gallops Pulm: Clear anteriorly; no wheezing Abdomen:  Distended, shifting dullness, Hypoactive bowel sounds. No tenderness . Without guarding and Without rebound, No organomegaly appreciated. Extremities:  Without edema. Msk:  Symmetrical without gross deformities. Peripheral pulses intact.  Neurologic:  Alert and  oriented x4;  No focal deficits.  Skin:   Dry and intact without significant lesions or rashes. Psychiatric:  Cooperative. Normal mood and affect.  LAB RESULTS: Recent Labs    03/10/23 2134 03/11/23 0830  WBC 13.0* 17.1*  HGB 11.3* 9.5*  HCT 32.7* 27.9*  PLT 352 287   BMET Recent Labs    03/10/23 2134 03/11/23 0830  NA 137 138  K 3.2* 3.6  CL 105 109  CO2 23 20*  GLUCOSE 117* 93  BUN 18 17  CREATININE 1.21 1.16  CALCIUM 8.9 7.9*   LFT Recent Labs    03/11/23 0830  PROT 5.4*  ALBUMIN 1.7*  AST 78*  ALT 42  ALKPHOS 667*  BILITOT 8.3*   PT/INR No results for input(s): "LABPROT", "INR" in the last 72 hours.  STUDIES: CT ABDOMEN PELVIS W CONTRAST Result Date: 03/11/2023 CLINICAL DATA:  Jaundice possible seizure history cholangiocarcinoma EXAM: CT ABDOMEN AND PELVIS WITH CONTRAST TECHNIQUE: Multidetector CT imaging of the abdomen and pelvis was performed using the standard protocol following bolus administration of intravenous contrast. RADIATION DOSE REDUCTION: This exam was performed according to the departmental dose-optimization program which includes automated exposure control, adjustment of the mA and/or kV according to patient size and/or use of iterative reconstruction technique. CONTRAST:  OMNIPAQUE IOHEXOL 300  MG/ML  SOLN COMPARISON:  CT 03/25/2022 FINDINGS: Lower chest: Lung bases demonstrate emphysema. No acute airspace disease. Small hiatal hernia. Circumferential distal esophageal thickening. Hepatobiliary: Interval diffuse biliary dilatation with development of large cystic densities throughout the liver, on the right measuring 9.6 by 6.3 cm and on the left measuring 7 x 3.7 cm. Poorly defined soft tissue mass at the porta hepatis, measures approximately 5.3 x 3.1 cm on coronal series 5, image 47. Dilated common bile duct up to 11 mm. Pancreas: No inflammation.  Slight prominence of the proximal duct Spleen: Within normal limits for size Adrenals/Urinary Tract: Adrenal glands are normal. Kidneys show no hydronephrosis. Cyst midpole left kidney. Cortical scarring right kidney. Mild right renal atrophy. Nonobstructing right kidney stone. Thick-walled irregular urinary bladder with multiple diverticula. Stomach/Bowel: Stomach nonenlarged. No dilated small bowel. No acute bowel wall thickening. Vascular/Lymphatic: Moderate aortic atherosclerosis. No aneurysm. No suspicious lymph nodes. 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 Reproductive: Enlarged prostate Other: No free air. Moderate to large volume of abdominopelvic ascites. Musculoskeletal: No acute or suspicious osseous abnormality. IMPRESSION: 1. Interval diffuse biliary dilatation with development of large cystic densities throughout the liver, consistent with biliary obstruction. Poorly defined soft tissue mass at the porta hepatis consistent with given history of cholangiocarcinoma. 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 2. Moderate to large volume of abdominopelvic ascites. Gastroesophageal varices. 3. Thick-walled irregular urinary bladder with multiple diverticula consistent with chronic bladder outlet obstruction. Enlarged prostate. 4. Nonobstructing right kidney stone. 5. Aortic atherosclerosis.  Aortic Atherosclerosis (ICD10-I70.0) and Emphysema (ICD10-J43.9). Electronically Signed   By: Jasmine Pang M.D.   On: 03/11/2023 00:27   CT Head Wo Contrast Result Date: 03/10/2023 CLINICAL DATA:  Altered mental status  EXAM: CT HEAD WITHOUT CONTRAST TECHNIQUE: Contiguous axial images were obtained from the base of the skull through the vertex without intravenous contrast. RADIATION DOSE REDUCTION: This exam was performed according to the departmental dose-optimization program which includes automated exposure control, adjustment of the mA and/or kV according to patient size and/or use of iterative reconstruction technique. COMPARISON:  05/07/2006 FINDINGS: Brain: No mass, hemorrhage or extra-axial collection. There is hypoattenuation of the white matter. CSF spaces are normal. Vascular: Negative Skull: Negative Sinuses/Orbits: Paranasal sinuses are clear. No mastoid effusion. Normal orbits. Other: None. IMPRESSION: 1. No acute intracranial abnormality. 2. Findings of chronic small vessel ischemia. Electronically Signed   By: Deatra Robinson M.D.   On: 03/10/2023 23:46   DG Chest Port 1 View Result Date: 03/10/2023 CLINICAL DATA:  Seizure jaundiced EXAM: PORTABLE CHEST 1 VIEW COMPARISON:  08/17/2019 FINDINGS: Stable cardiomediastinal silhouette. Aortic atherosclerotic calcification. Elevated right hemidiaphragm. Bibasilar atelectasis. No focal consolidation, pleural effusion, or pneumothorax. No displaced rib fractures. IMPRESSION: No active disease. Electronically Signed   By: Minerva Fester M.D.   On: 03/10/2023 23:36      Impression /Plan:   77 year old male with history of seizure disorder, alcohol tobacco abuse, history of cholangiocarcinoma 2021 status post radiation but not operable presents with worsening jaundice, fever.  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  - RD consult Count calories, increase protein  Leukocytosis, afebrile, tachycardia, tachypnea Lactic acid initially elevated improved with IV fluids No left shift Pending blood cultures, will check for SBP Potential cholangitis but no abdominal pain, possible from inflammation/cholangiocarcinoma Continue Zosyn   Neurogenic bladder/UTI Monitor  Principal Problem:   Acute metabolic encephalopathy Active Problems:   TOBACCO ABUSE   Hypokalemia   Seizure (HCC)   Biliary obstruction   Lactic acidosis   Hypoalbuminemia due to protein-calorie malnutrition (HCC)   Transaminitis   Macrocytic anemia   History of cholangiocarcinoma    LOS: 0 days    Thank you for your kind consultation,  we will continue to follow.   Doree Albee  03/11/2023, 1:09 PM

## 2023-03-11 NOTE — ED Notes (Signed)
ED TO INPATIENT HANDOFF REPORT  ED Nurse Name and Phone #: Joneen Roach RN   S Name/Age/Gender Mason Blackburn 77 y.o. male Room/Bed: APA06/APA06  Code Status   Code Status: Prior  Home/SNF/Other Home Patient oriented to: self, place, and situation Is this baseline? Yes   Triage Complete: Triage complete  Chief Complaint Acute metabolic encephalopathy [G93.41]  Triage Note Patient from home, found by bystanders inside his home acting altered, maybe had a seizure, however did not note any tonic clonic activity. Patient does have a hx of same. Per EMS patient was Aox2. Unable to recall the last time he ate or drank anything. CBG 138 HR 112 BP 110/60, 96% on RA.    Allergies No Known Allergies  Level of Care/Admitting Diagnosis ED Disposition     ED Disposition  Admit   Condition  --   Comment  Hospital Area: [facility] [[id number]]  Level of Care: Telemetry Medical [104]  May admit patient to [facility] if equivalent level of care is available:: Yes  Covid Evaluation: Asymptomatic - no recent exposure (last 10 days) testing not required  Diagnosis: Acute metabolic encephalopathy [0981191]  Admitting Physician: Frankey Shown [[id number]]  Attending Physician: Frankey Shown [[id number]]  Certification:: I certify this patient will need inpatient services for at least 2 midnights  Expected Medical Readiness: 03/14/2023          B Medical/Surgery History Past Medical History:  Diagnosis Date   Alcohol abuse    Anxiety    Cancer (HCC)    Cataract    Enlarged prostate    Seizures (HCC)    Past Surgical History:  Procedure Laterality Date   CATARACT EXTRACTION, BILATERAL  2020   IR CATHETER TUBE CHANGE  10/03/2019     A IV Location/Drains/Wounds Patient Lines/Drains/Airways Status     Active Line/Drains/Airways     Name Placement date Placement time Site Days   Peripheral IV 11/19/22 20 G Right Antecubital 11/19/22  1146   Antecubital  112   Peripheral IV 03/10/23 20 G Anterior;Distal;Left;Upper Arm 03/10/23  2110  Arm  1            Intake/Output Last 24 hours  Intake/Output Summary (Last 24 hours) at 03/11/2023 0503 Last data filed at 03/11/2023 7846 Gross per 24 hour  Intake 2032.3 ml  Output --  Net 2032.3 ml    Labs/Imaging Results for orders placed or performed during the hospital encounter of 03/10/23 (from the past 48 hours)  CBG monitoring, ED     Status: Abnormal   Collection Time: 03/10/23  9:07 PM  Result Value Ref Range   Glucose-Capillary 110 (H) 70 - 99 mg/dL    Comment: Glucose reference range applies only to samples taken after fasting for at least 8 hours.  CBC with Differential/Platelet     Status: Abnormal   Collection Time: 03/10/23  9:34 PM  Result Value Ref Range   WBC 13.0 (H) 4.0 - 10.5 K/uL   RBC 3.01 (L) 4.22 - 5.81 MIL/uL   Hemoglobin 11.3 (L) 13.0 - 17.0 g/dL   HCT 96.2 (L) 95.2 - 84.1 %   MCV 108.6 (H) 80.0 - 100.0 fL   MCH 37.5 (H) 26.0 - 34.0 pg   MCHC 34.6 30.0 - 36.0 g/dL   RDW 32.4 40.1 - 02.7 %   Platelets 352 150 - 400 K/uL   nRBC 0.0 0.0 - 0.2 %   Neutrophils Relative % 82 %  Neutro Abs 10.6 (H) 1.7 - 7.7 K/uL   Lymphocytes Relative 8 %   Lymphs Abs 1.0 0.7 - 4.0 K/uL   Monocytes Relative 8 %   Monocytes Absolute 1.0 0.1 - 1.0 K/uL   Eosinophils Relative 1 %   Eosinophils Absolute 0.2 0.0 - 0.5 K/uL   Basophils Relative 1 %   Basophils Absolute 0.1 0.0 - 0.1 K/uL   Immature Granulocytes 0 %   Abs Immature Granulocytes 0.05 0.00 - 0.07 K/uL    Comment: Performed at [facility], [address], [place], [place] [address]  Comprehensive metabolic panel     Status: Abnormal   Collection Time: 03/10/23  9:34 PM  Result Value Ref Range   Sodium 137 135 - 145 mmol/L   Potassium 3.2 (L) 3.5 - 5.1 mmol/L   Chloride 105 98 - 111 mmol/L   CO2 23 22 - 32 mmol/L   Glucose, Bld 117 (H) 70 - 99 mg/dL    Comment: Glucose reference range applies only to  samples taken after fasting for at least 8 hours.   BUN 18 8 - 23 mg/dL   Creatinine, Ser 1.91 0.61 - 1.24 mg/dL   Calcium 8.9 8.9 - 47.8 mg/dL   Total Protein 6.8 6.5 - 8.1 g/dL   Albumin 2.2 (L) 3.5 - 5.0 g/dL   AST 71 (H) 15 - 41 U/L   ALT 46 (H) 0 - 44 U/L   Alkaline Phosphatase 833 (H) 38 - 126 U/L   Total Bilirubin 9.7 (H) <1.2 mg/dL   GFR, Estimated >29 >56 mL/min    Comment: (NOTE) Calculated using the CKD-EPI Creatinine Equation (2021)    Anion gap 9 5 - 15    Comment: Performed at [facility], [address] [place], [place] [address]  Lipase, blood     Status: None   Collection Time: 03/10/23  9:34 PM  Result Value Ref Range   Lipase 38 11 - 51 U/L    Comment: Performed at [facility], [address] [name], [place] [address]  Ethanol     Status: None   Collection Time: 03/10/23  9:34 PM  Result Value Ref Range   Alcohol, Ethyl (B) <10 <10 mg/dL    Comment: (NOTE) Lowest detectable limit for serum alcohol is 10 mg/dL.  For medical purposes only. Performed at [facility], [address], [place], [place] [address]   Ammonia     Status: None   Collection Time: 03/10/23  9:34 PM  Result Value Ref Range   Ammonia 35 9 - 35 umol/L    Comment: Performed at [facility], [address], [place], [place] [address]  Lactic acid, plasma     Status: Abnormal   Collection Time: 03/10/23  9:34 PM  Result Value Ref Range   Lactic Acid, Venous 2.0 (HH) 0.5 - 1.9 mmol/L    Comment: CRITICAL RESULT CALLED TO, READ BACK BY AND VERIFIED WITH NICHOLS,K ON 03/10/23 AT 2010 BY LOY,C Performed at [facility], [address] [place], [place] [address]   Culture, blood (Routine X 2) w Reflex to ID Panel     Status: None (Preliminary result)   Collection Time: 03/10/23 10:05 PM   Specimen: BLOOD  Result Value Ref Range   Specimen Description BLOOD RIGHT ANTECUBITAL    Special Requests      BOTTLES DRAWN AEROBIC AND ANAEROBIC Blood Culture adequate volume Performed at [facility], [address] [place], [place] [address]    Culture  PENDING    Report Status PENDING   Culture, blood (Routine X 2) w Reflex to ID Panel     Status: None (Preliminary result)   Collection Time: 03/10/23 10:05 PM   Specimen: BLOOD  Result Value Ref Range   Specimen Description BLOOD BLOOD RIGHT HAND    Special Requests      BOTTLES DRAWN AEROBIC ONLY Blood Culture adequate volume Performed at [facility], [address], [place], [place] [address]    Culture PENDING    Report Status PENDING   Lactic acid, plasma     Status: Abnormal   Collection Time: 03/10/23 10:05 PM  Result Value Ref Range   Lactic Acid, Venous 2.3 (HH) 0.5 - 1.9 mmol/L    Comment: CRITICAL VALUE NOTED.  VALUE IS CONSISTENT WITH PREVIOUSLY REPORTED AND CALLED VALUE. Performed at [facility], [address], [place], [place] [address]    CT ABDOMEN PELVIS W CONTRAST Result Date: 03/11/2023 CLINICAL DATA:  Jaundice possible seizure history cholangiocarcinoma EXAM: CT ABDOMEN AND PELVIS WITH CONTRAST TECHNIQUE: Multidetector CT imaging of the abdomen and pelvis was performed using the standard protocol following bolus administration of intravenous contrast. RADIATION DOSE REDUCTION: This exam was performed according to the departmental dose-optimization program which includes automated exposure control, adjustment of the mA and/or kV according to patient size and/or use of iterative reconstruction technique. CONTRAST:  OMNIPAQUE IOHEXOL 300 MG/ML  SOLN COMPARISON:  CT 03/25/2022 FINDINGS: Lower chest: Lung bases demonstrate emphysema. No acute airspace disease. Small hiatal hernia. Circumferential distal esophageal thickening. Hepatobiliary: Interval diffuse biliary dilatation with development of large cystic densities throughout the liver, on the right measuring 9.6 by 6.3 cm and on the left measuring 7 x 3.7 cm. Poorly defined soft tissue mass at the porta hepatis, measures approximately 5.3 x 3.1 cm on coronal  series 5, image 47. Dilated common bile duct up to 11 mm. Pancreas: No inflammation.  Slight prominence of the proximal duct Spleen: Within normal limits for size Adrenals/Urinary Tract: Adrenal glands are normal. Kidneys show no hydronephrosis. Cyst midpole left kidney. Cortical scarring right kidney. Mild right renal atrophy. Nonobstructing right kidney stone. Thick-walled irregular urinary bladder with multiple diverticula. Stomach/Bowel: Stomach nonenlarged. No dilated small bowel. No acute bowel wall thickening. Vascular/Lymphatic: Moderate aortic atherosclerosis. No aneurysm. No suspicious lymph nodes. 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 Reproductive: Enlarged prostate Other: No free air. Moderate to large volume of abdominopelvic ascites. Musculoskeletal: No acute or suspicious osseous abnormality. IMPRESSION: 1. Interval diffuse biliary dilatation with development of large cystic densities throughout the liver, consistent with biliary obstruction. Poorly defined soft tissue mass at the porta hepatis consistent with given history of cholangiocarcinoma. 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 2. Moderate to large volume of abdominopelvic ascites. Gastroesophageal varices. 3. Thick-walled irregular urinary bladder with multiple diverticula consistent with chronic bladder outlet obstruction. Enlarged prostate. 4. Nonobstructing right kidney stone. 5. Aortic atherosclerosis. Aortic Atherosclerosis (ICD10-I70.0) and Emphysema (ICD10-J43.9). Electronically Signed   By: Jasmine Pang M.D.   On: 03/11/2023 00:27   CT Head Wo Contrast Result Date: 03/10/2023 CLINICAL DATA:  Altered mental status EXAM: CT HEAD WITHOUT CONTRAST TECHNIQUE: Contiguous axial images were obtained from the base of the skull through the vertex without intravenous contrast. RADIATION DOSE REDUCTION: This exam was performed according to the departmental dose-optimization  program which includes automated exposure control, adjustment of the mA and/or kV according to patient size and/or use of iterative reconstruction  technique. COMPARISON:  05/07/2006 FINDINGS: Brain: No mass, hemorrhage or extra-axial collection. There is hypoattenuation of the white matter. CSF spaces are normal. Vascular: Negative Skull: Negative Sinuses/Orbits: Paranasal sinuses are clear. No mastoid effusion. Normal orbits. Other: None. IMPRESSION: 1. No acute intracranial abnormality. 2. Findings of chronic small vessel ischemia. Electronically Signed   By: Deatra Robinson M.D.   On: 03/10/2023 23:46   DG Chest Port 1 View Result Date: 03/10/2023 CLINICAL DATA:  Seizure jaundiced EXAM: PORTABLE CHEST 1 VIEW COMPARISON:  08/17/2019 FINDINGS: Stable cardiomediastinal silhouette. Aortic atherosclerotic calcification. Elevated right hemidiaphragm. Bibasilar atelectasis. No focal consolidation, pleural effusion, or pneumothorax. No displaced rib fractures. IMPRESSION: No active disease. Electronically Signed   By: Minerva Fester M.D.   On: 03/10/2023 23:36    Pending Labs Unresulted Labs (From admission, onward)     Start     Ordered   03/10/23 2142  Urinalysis, Routine w reflex microscopic -Urine, Clean Catch  Once,   URGENT       Question:  Specimen Source  Answer:  Urine, Clean Catch   03/10/23 2141            Vitals/Pain Today's Vitals   03/11/23 0230 03/11/23 0300 03/11/23 0330 03/11/23 0435  BP: 91/73 102/75 97/72 92/64   Pulse: (!) 107 (!) 106 (!) 110 100  Resp: (!) 25 (!) 26 (!) 26 20  Temp:      TempSrc:      SpO2: 94% 95% 93% 93%  Weight:      Height:      PainSc:        Isolation Precautions No active isolations  Medications Medications  0.9 %  sodium chloride infusion ( Intravenous Restarted 03/11/23 0441)  sodium chloride 0.9 % bolus 1,000 mL (0 mLs Intravenous Stopped 03/11/23 0049)  iohexol (OMNIPAQUE) 300 MG/ML solution 100 mL (100 mLs Intravenous Contrast Given  03/10/23 2221)  ondansetron (ZOFRAN) injection 4 mg (4 mg Intravenous Given 03/10/23 2321)  sodium chloride 0.9 % bolus 1,000 mL (0 mLs Intravenous Stopped 03/11/23 0358)  piperacillin-tazobactam (ZOSYN) IVPB 3.375 g (0 g Intravenous Stopped 03/11/23 0358)    Mobility walks with person assist     Focused Assessments Neuro Assessment Handoff:  Swallow screen pass? Yes  Cardiac Rhythm: Sinus tachycardia       Neuro Assessment: Within Defined Limits Neuro Checks:       If patient is a Neuro Trauma and patient is going to OR before floor call report to 4N Charge nurse: [phone] or [phone]   R Recommendations: See Admitting Provider Note  Report given to:   Additional Notes:

## 2023-03-11 NOTE — Progress Notes (Signed)
Pharmacy Antibiotic Note  Mason Blackburn is a 77 y.o. male admitted on 03/10/2023 with  altered mental status and seizures .  Pharmacy has been consulted for zosyn dosing.  Patient with tmax of 99.2, wbc 17.1. Scr appears normal.   Plan: Zosyn 3.375g IV q8 hours Follow up cultures and sensitivities  Height: 5\' 8"  (172.7 cm) Weight: 60 kg (132 lb 4.4 oz) IBW/kg (Calculated) : 68.4  Temp (24hrs), Avg:98.4 F (36.9 C), Min:98.1 F (36.7 C), Max:99.2 F (37.3 C)  Recent Labs  Lab 03/10/23 2134 03/10/23 2205 03/11/23 0600 03/11/23 0830  WBC 13.0*  --   --  17.1*  CREATININE 1.21  --   --  1.16  LATICACIDVEN 2.0* 2.3* 1.8 1.5    Estimated Creatinine Clearance: 46.7 mL/min (by C-G formula based on SCr of 1.16 mg/dL).    No Known Allergies Thank you for allowing pharmacy to be a part of this patient's care.  Sheppard Coil PharmD., BCPS Clinical Pharmacist 03/11/2023 10:38 AM

## 2023-03-11 NOTE — ED Notes (Addendum)
Pt complains of burning when urinating and urine is very dark brown, bladder looks distended, after pt voided this tech completed a bladder scan which shows >500 ml residual.

## 2023-03-11 NOTE — ED Notes (Signed)
Janee Morn,  MD aware of pt have 500 mL during bladder scan by NT, pt to have one time I&O per MD, ascites present and may be detected by bladder scan per Janee Morn, MD

## 2023-03-11 NOTE — Progress Notes (Signed)
   03/11/23 1015  TOC Brief Assessment  Insurance and Status Reviewed  Patient has primary care physician Yes  Home environment has been reviewed from home  Prior level of function: independent  Prior/Current Home Services No current home services  Social Drivers of Health Review SDOH reviewed no interventions necessary  Readmission risk has been reviewed Yes  Transition of care needs no transition of care needs at this time   Transition of Care Department [facility]) has reviewed patient and no TOC needs have been identified at this time. We will continue to monitor patient advancement through interdisciplinary progression rounds. If new patient transition needs arise, please place a TOC consult.

## 2023-03-11 NOTE — ED Notes (Signed)
Notified Dr. Thomes Dinning that patient's BP has become softer

## 2023-03-11 NOTE — Progress Notes (Signed)
I have seen and assessed patient and agree with Dr. Felecia Jan assessment and plan. Patient pleasant 77 year old gentleman history of seizure disorder, cholangiocarcinoma, alcohol abuse, tobacco abuse presented to the ED due to initial confusion which subsequently resolved.  Patient seen in the ED initially noted to be tachypneic and tachycardia.  Workup in the ED noted with a leukocytosis, transaminitis.  CT abdomen and pelvis showed interval diffuse biliary dilatation development of large cystic densities throughout the liver consistent with biliary obstruction.  Poorly defined soft tissue mass at the porta hepatis consistent with given history of cholangiocarcinoma.  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  Moderate to large volume of abdominal pelvic ascites.  Gastroesophageal varices noted.  Head CT done unremarkable.  Chest x-ray unremarkable.  Gastroenterologist on-call at [facility] was consulted for possible need to have ERCP and recommended patient be admitted to [facility].  Patient admitted this morning to [facility] and awaiting transfer.  Assessment/plan #1 biliary obstruction -CT abdomen and pelvis done on admission concern for biliary obstruction.  Patient noted to be jaundiced. -Gastroenterology was consulted recommended patient be admitted to [facility] and patient awaiting transfer. -Patient currently n.p.o. pending transfer. -Placed empirically on IV Zosyn.  2.  Acute metabolic encephalopathy -Resolved. -Patient denies any significant confusion stated his neighbor had knocked on his door several times and he did not hear he was watching television and subsequently EMS was called. -Ammonia levels within normal limits.  3.  Hypokalemia -Repleted.  4.  Lactic acidosis -Trend lactic acid level.  5.  Abdominal pelvic ascites -Noted on CT abdomen and pelvis. -Patient with no significant abdominal distention on examination and nontender to palpation. -May need  paracentesis however will defer until patient has been assessed by GI.  6.  Seizure disorder -Continue Trileptal.  7.  History of cholangiocarcinoma -Patient being admitted to [facility] due to biliary obstruction and evaluation by GI. -Will inform oncology via epic of admission.  No charge.

## 2023-03-11 NOTE — ED Notes (Addendum)
Ramiro Harvest, MD notified re: pt BP [phone]

## 2023-03-12 ENCOUNTER — Inpatient Hospital Stay (HOSPITAL_COMMUNITY): Payer: Medicare Other

## 2023-03-12 DIAGNOSIS — Z8509 Personal history of malignant neoplasm of other digestive organs: Secondary | ICD-10-CM

## 2023-03-12 DIAGNOSIS — R7401 Elevation of levels of liver transaminase levels: Secondary | ICD-10-CM

## 2023-03-12 DIAGNOSIS — F1010 Alcohol abuse, uncomplicated: Secondary | ICD-10-CM | POA: Diagnosis not present

## 2023-03-12 DIAGNOSIS — C801 Malignant (primary) neoplasm, unspecified: Secondary | ICD-10-CM | POA: Diagnosis not present

## 2023-03-12 DIAGNOSIS — R932 Abnormal findings on diagnostic imaging of liver and biliary tract: Secondary | ICD-10-CM

## 2023-03-12 DIAGNOSIS — C221 Intrahepatic bile duct carcinoma: Secondary | ICD-10-CM

## 2023-03-12 DIAGNOSIS — E8809 Other disorders of plasma-protein metabolism, not elsewhere classified: Secondary | ICD-10-CM

## 2023-03-12 DIAGNOSIS — D539 Nutritional anemia, unspecified: Secondary | ICD-10-CM

## 2023-03-12 DIAGNOSIS — R188 Other ascites: Secondary | ICD-10-CM | POA: Diagnosis not present

## 2023-03-12 DIAGNOSIS — E46 Unspecified protein-calorie malnutrition: Secondary | ICD-10-CM

## 2023-03-12 DIAGNOSIS — F17200 Nicotine dependence, unspecified, uncomplicated: Secondary | ICD-10-CM

## 2023-03-12 DIAGNOSIS — E872 Acidosis, unspecified: Secondary | ICD-10-CM

## 2023-03-12 DIAGNOSIS — R569 Unspecified convulsions: Secondary | ICD-10-CM

## 2023-03-12 DIAGNOSIS — K831 Obstruction of bile duct: Secondary | ICD-10-CM

## 2023-03-12 DIAGNOSIS — E876 Hypokalemia: Secondary | ICD-10-CM

## 2023-03-12 DIAGNOSIS — G9341 Metabolic encephalopathy: Secondary | ICD-10-CM | POA: Diagnosis not present

## 2023-03-12 DIAGNOSIS — E440 Moderate protein-calorie malnutrition: Secondary | ICD-10-CM

## 2023-03-12 LAB — COMPREHENSIVE METABOLIC PANEL
ALT: 52 U/L — ABNORMAL HIGH (ref 0–44)
AST: 101 U/L — ABNORMAL HIGH (ref 15–41)
Albumin: 1.9 g/dL — ABNORMAL LOW (ref 3.5–5.0)
Alkaline Phosphatase: 668 U/L — ABNORMAL HIGH (ref 38–126)
Anion gap: 3 — ABNORMAL LOW (ref 5–15)
BUN: 23 mg/dL (ref 8–23)
CO2: 18 mmol/L — ABNORMAL LOW (ref 22–32)
Calcium: 7.9 mg/dL — ABNORMAL LOW (ref 8.9–10.3)
Chloride: 115 mmol/L — ABNORMAL HIGH (ref 98–111)
Creatinine, Ser: 1.32 mg/dL — ABNORMAL HIGH (ref 0.61–1.24)
GFR, Estimated: 56 mL/min — ABNORMAL LOW (ref >60)
Glucose, Bld: 120 mg/dL — ABNORMAL HIGH (ref 70–99)
Potassium: 3.6 mmol/L (ref 3.5–5.1)
Sodium: 136 mmol/L (ref 135–145)
Total Bilirubin: 8 mg/dL — ABNORMAL HIGH (ref <1.2)
Total Protein: 5.8 g/dL — ABNORMAL LOW (ref 6.5–8.1)

## 2023-03-12 LAB — CBC
HCT: 28.9 % — ABNORMAL LOW (ref 39.0–52.0)
Hemoglobin: 9.8 g/dL — ABNORMAL LOW (ref 13.0–17.0)
MCH: 36.8 pg — ABNORMAL HIGH (ref 26.0–34.0)
MCHC: 33.9 g/dL (ref 30.0–36.0)
MCV: 108.6 fL — ABNORMAL HIGH (ref 80.0–100.0)
Platelets: 276 10*3/uL (ref 150–400)
RBC: 2.66 MIL/uL — ABNORMAL LOW (ref 4.22–5.81)
RDW: 14.6 % (ref 11.5–15.5)
WBC: 11.3 10*3/uL — ABNORMAL HIGH (ref 4.0–10.5)
nRBC: 0 % (ref 0.0–0.2)

## 2023-03-12 LAB — BODY FLUID CELL COUNT WITH DIFFERENTIAL
Eos, Fluid: 0 %
Lymphs, Fluid: 7 %
Monocyte-Macrophage-Serous Fluid: 50 % (ref 50–90)
Neutrophil Count, Fluid: 43 % — ABNORMAL HIGH (ref 0–25)
Total Nucleated Cell Count, Fluid: 166 uL (ref 0–1000)

## 2023-03-12 LAB — GRAM STAIN

## 2023-03-12 LAB — PHOSPHORUS: Phosphorus: 3.6 mg/dL (ref 2.5–4.6)

## 2023-03-12 LAB — URINE CULTURE: Culture: <10000 — AB

## 2023-03-12 LAB — MAGNESIUM: Magnesium: 2.1 mg/dL (ref 1.7–2.4)

## 2023-03-12 MED ORDER — MIDODRINE HCL 5 MG PO TABS: 10.0000 mg | ORAL_TABLET | Freq: Three times a day (TID) | ORAL | Status: DC

## 2023-03-12 MED ORDER — ALBUMIN HUMAN 25 % IV SOLN
25.0000 g | Freq: Once | INTRAVENOUS | Status: AC
  Administered 2023-03-12: 25 g via INTRAVENOUS
  Filled 2023-03-12: qty 100

## 2023-03-12 MED ORDER — FOLIC ACID 1 MG PO TABS
1.0000 mg | ORAL_TABLET | Freq: Every day | ORAL | Status: DC
  Administered 2023-03-12: 1 mg via ORAL
  Filled 2023-03-12 (×2): qty 1

## 2023-03-12 MED ORDER — LIDOCAINE HCL (PF) 1 % IJ SOLN
10.0000 mL | Freq: Once | INTRAMUSCULAR | Status: AC
  Administered 2023-03-12: 10 mL

## 2023-03-12 MED ORDER — MIDODRINE HCL 5 MG PO TABS
10.0000 mg | ORAL_TABLET | ORAL | Status: AC
  Administered 2023-03-12: 10 mg via ORAL
  Filled 2023-03-12: qty 2

## 2023-03-12 MED ORDER — SODIUM CHLORIDE 0.9 % IV BOLUS
500.0000 mL | Freq: Once | INTRAVENOUS | Status: AC
  Administered 2023-03-12: 500 mL via INTRAVENOUS

## 2023-03-12 MED ORDER — CHLORHEXIDINE GLUCONATE CLOTH 2 % EX PADS
6.0000 | MEDICATED_PAD | Freq: Every day | CUTANEOUS | Status: DC
  Administered 2023-03-13: 6 via TOPICAL

## 2023-03-12 NOTE — Progress Notes (Addendum)
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   Worsening jaundice in setting of known cholangiocarcinoma Last follow-up with oncology 2022 AST 78 ALT 42  Alkphos 667 TBili 8.3 Ammonia normal, pending INR, history of alcohol use CT abdomen pelvis with contrast showed interval diffuse biliary dilation and large cystic densities throughout the liver consistent with biliary obstruction poorly defined soft tissue mass of hepatitis with occlusion of portal vein and branch vessels by tumor moderate to large volume of other pelvic ascites with gastric varices MR abdomen MRCP notable for numerous large heterogeneously enhancing solid and cystic liver lesions nearly confluent and largest in the anterior right lobe of the liver 6.8 x 6.4 cm.  These are consistent with extensive hepatic metastatic disease.  New since 03/25/2022.  The left lobe of the liver is severely atrophic, with severe intrahepatic biliary ductal dilation and ill-defined tissue throughout the hepatic hilum consistent with cholangiocarcinoma and stricturing of the central common hepatic duct.  Portal vein effaced by mass and there are retroperitoneal varices.  Ascites Status post paracentesis today Metabolic encephalopathy CT head unremarkable other than small vessel disease Ammonia normal Possible from infection/nutritional deficiencies  Protein malnutrition moderate to severe in setting of cholangiocarcinoma Albumin 03/11/2023  1.7  BMI body mass index is 20.11 kg/m.  - RD consult Count calories, increase protein  ----------------------------------------------------------------------------------------------   The clinical scenario is compatible with worsening metastatic  cholangiocarcinoma.  He has a very limited life expectancy in my opinion.  Though there is biliary obstruction the liver is also full of tumor.  ERCP with stenting is a potential procedure though it is often technically difficult in this setting and I think it is of limited value given the metastases in the liver and the atrophy of the left lobe where the biliary dilation is.  I had a conversation with the patient and his family.  The patient does not seem to grasp his medical condition, I did not formally test his mental status but he does not have recall of his medical conditions in the past and may have limited capacity to make decisions.  His family (son daughter and daughter-in-law) understand the gravity of the situation and are accepting of the idea of hospice care which I think is appropriate for this patient.  Fortunately he is not in pain and he does not have pruritus.  A palliative medicine consult has been requested.  Will not plan on any other interventional procedures at this time.   High level of medical complexity and decision making  Iva Boop, MD, [facility] See Loretha Stapler on call - gastroenterology for best contact person 03/12/2023 12:38 PM      Subjective  Just back from paracentesis.  Denies pain denies pruritus.  Does not really know what his problems are when asked.   Objective  BP 93/66 (BP Location: Right Arm)   Pulse (!) 101   Temp (!) 97.5 F (36.4 C) (Oral)   Resp 20   Ht 5\' 8"  (1.727 m)   Wt 60 kg   SpO2 96%   BMI 20.11 kg/m  Cachectic jaundiced man in bed no acute distress Abdomen protuberant distended with some ascites soft and nontender   Recent Labs  Lab 03/10/23 2134 03/11/23 0830 03/11/23 1407 03/12/23 0422  AST 71* 78*  --  101*  ALT 46* 42  --  52*  ALKPHOS 833* 667*  --  668*  BILITOT 9.7* 8.3*  --  8.0*  PROT 6.8 5.4*  --  5.8*  ALBUMIN 2.2* 1.7*  --  1.9*  INR  --   --  1.6*  --    Recent Labs  Lab  03/10/23 2134 03/11/23 0830 03/12/23 0422  HGB 11.3* 9.5* 9.8*  HCT 32.7* 27.9* 28.9*  WBC 13.0* 17.1* 11.3*  PLT 352 287 276   Recent Labs  Lab 03/10/23 2134 03/11/23 0830 03/12/23 0422  NA 137 138 136  K 3.2* 3.6 3.6  CL 105 109 115*  CO2 23 20* 18*  GLUCOSE 117* 93 120*  BUN 18 17 23   CREATININE 1.21 1.16 1.32*  CALCIUM 8.9 7.9* 7.9*  MG  --  1.9 2.1  PHOS  --   --  3.6      Iva Boop, MD, [facility] See Loretha Stapler on call - gastroenterology for best contact person 03/12/2023 12:28 PM

## 2023-03-12 NOTE — Assessment & Plan Note (Signed)
Continue to have disease advancement, not a candidate for any surgical or medical intervention at this time. -Palliative care was consulted -Supportive care and symptom management with focus on comfort.

## 2023-03-12 NOTE — Procedures (Signed)
PROCEDURE SUMMARY:  Successful US guided paracentesis from right lateral abdomen.  Yielded 1 liters of clear yellow fluid.  No immediate complications.  Patient tolerated well.  EBL = trace  Specimen was sent for labs.  Nikeya Maxim S Thaddius Manes PA-C 03/12/2023 11:29 AM

## 2023-03-12 NOTE — Assessment & Plan Note (Signed)
Resolved

## 2023-03-12 NOTE — Progress Notes (Signed)
MEWS Progress Note  Patient Details Name: Ramello Cowick MRN: [id number] DOB: Apr 22, 1945 Today's Date: 03/12/2023   MEWS Flowsheet Documentation:  Assess: MEWS Score Temp: 97.8 F (36.6 C) BP: (!) 88/64 MAP (mmHg): 73 Pulse Rate: 93 ECG Heart Rate: 93 Resp: (!) 24 Level of Consciousness: Alert SpO2: 94 % O2 Device: Room Air O2 Flow Rate (L/min): 2 L/min FiO2 (%): 21 % Assess: MEWS Score MEWS Temp: 0 MEWS Systolic: 1 MEWS Pulse: 0 MEWS RR: 1 MEWS LOC: 0 MEWS Score: 2 MEWS Score Color: Yellow Assess: SIRS CRITERIA SIRS Temperature : 0 SIRS Respirations : 1 SIRS Pulse: 1 SIRS WBC: 0 SIRS Score Sum : 2 SIRS Temperature : 0 SIRS Pulse: 1 SIRS Respirations : 1 SIRS WBC: 0 SIRS Score Sum : 2 Assess: if the MEWS score is Yellow or Red Were vital signs accurate and taken at a resting state?: Yes Does the patient meet 2 or more of the SIRS criteria?: No MEWS guidelines implemented : No, previously yellow, continue vital signs every 4 hours        Oren Binet 03/12/2023, 10:50 PM

## 2023-03-12 NOTE — Assessment & Plan Note (Signed)
Worsening liver enzymes, MRCP with worsening and your lesions involving liver, biliary ducts and lymph node. -GI is on board-need to decide whether ERCP will help or not -Palliative care consult

## 2023-03-12 NOTE — Assessment & Plan Note (Signed)
CT head and ammonia levels without any significant abnormality. Slowly worsening mentation per family.  Oriented x 2. 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 -Palliative care consult -Advised patient and family to have HPOA, son is going to step up but need paperwork -Continue to monitor

## 2023-03-12 NOTE — Assessment & Plan Note (Signed)
Improved with repletion. -Continue to monitor and replete as needed

## 2023-03-12 NOTE — Assessment & Plan Note (Signed)
Concern of malignant ascites s/p paracentesis with pending labs. Patient was started on Zosyn for concern of intra-abdominal infection. -Will continue with Zosyn until peritoneal fluid cultures are negative

## 2023-03-12 NOTE — Assessment & Plan Note (Signed)
-  Dietitian consult -Continue with dietary supplement

## 2023-03-12 NOTE — Assessment & Plan Note (Signed)
 Continue home Trileptal

## 2023-03-12 NOTE — Assessment & Plan Note (Signed)
B12 above 900, borderline folate. Patient with history of cholangiocarcinoma and alcohol abuse. -Folic acid supplement

## 2023-03-12 NOTE — Assessment & Plan Note (Signed)
Counseling was provided. -Nicotine patch as needed 

## 2023-03-12 NOTE — Progress Notes (Signed)
Pt having soft BP throughout afternoon and into evening shift. Pt had received x2 500cc boluses and 25G albumin w/ poor response. Delma Post, MD notified of BP: 71/60 (66). Orders placed to give 10mg  midodrine STAT. Pt currently resting calmly w/ no complaints, A&Ox2-3. Will continue to monitor.   0020: Pt BP still soft after interventions: 81/61 (69). Dr. Margo Aye updated. Orders placed for 25G albumin and changes made to midodrine schedule (see MAR).

## 2023-03-12 NOTE — Progress Notes (Signed)
   03/12/23 1533  Vitals  Temp 97.8 F (36.6 C)  Temp Source Oral  BP (!) 74/59  MAP (mmHg) 66  BP Location Right Arm  BP Method Automatic  Patient Position (if appropriate) Lying  Pulse Rate 86  Pulse Rate Source Monitor  ECG Heart Rate 87  Resp (!) 22  Level of Consciousness  Level of Consciousness Alert  MEWS COLOR  MEWS Score Color Yellow  Oxygen Therapy  SpO2 96 %  O2 Device Room Air  MEWS Score  MEWS Temp 0  MEWS Systolic 2  MEWS Pulse 0  MEWS RR 1  MEWS LOC 0  MEWS Score 3   Notified Dr. Nelson Chimes about hypotension. Order for NS bolus After bolus was completed, blood pressure was still low in the 70s/50s. Notified Dr. Nelson Chimes again and she placed new orders for another NS bolus, midodrine, and then added albumin.  Bolus is still running but have discussed with night shift RN to start the albumin after the bolus is completed.

## 2023-03-12 NOTE — Progress Notes (Signed)
Progress Note   Patient: Mason Blackburn HQI:[id number] DOB: [birth year] DOA: 03/10/2023     1 DOS: the patient was seen and examined on 03/12/2023   Brief hospital course: Taken from H&P.  Mason Blackburn is a 77 y.o. male with past medical history significant for seizure disorder failure TBI, neurogenic bladder/UTI follows [facility], tobacco/alcohol use, cholangiocarcinoma first diagnosed 2021 , stage Ib, had central liver mass 5.4 cm, CT chest that time multiple bilateral solid and nonsolid pulmonary nodules indeterminate cannot rule out metastasis, follow-up CT 2022 stable lung nodules..   Due to UTI, metabolic encephalopathy and sepsis patient was poor candidate for surgery/chemotherapy so had radiation 8/11-3 8/23 2021.  Not eligible for immunotherapy. Last follow-up with Mason Blackburn was 09/2020 Hospitalization 01/5 through 0 1/8 with urinary retention right upper quadrant abdominal pain, constipation found to have right lower lobe pneumonia VQ scan negative blood culture negative, E. coli UTI treated with Rocephin.   03/25/2022 CT abdomen pelvis with contrast for abdominal pain (admitted with right upper quadrant pain, found to have UTI/urinary retention, pneumonia) showed porta hepatis mass 4 cm without definite interval change, severe bilateral hydronephrosis and hydroureter with 1.3 cm stone left UVJ, urinary bladder wall thickening, enlarged prostate Patient admitted again the end of August into July with metabolic encephalopathy thought potentially postictal state versus alcohol intoxication/withdrawal positive alcohol during that visit.   Presents to the ER yesterday with altered mental status, significant jaundice at [facility], case was discussed with gastroenterology and they were recommending transferring to [facility] for MRCP and further evaluation.  CT abdomen and pelvis done earlier with interval diffuse biliary dilatation with development of large cystic densities  throughout the liver, consistent with biliary obstruction. Poorly defined soft tissue mass at the porta hepatis consistent with given history of cholangiocarcinoma. 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 2. Moderate to large volume of abdominopelvic ascites. Gastroesophageal varices. 3. Thick-walled irregular urinary bladder with multiple diverticula consistent with chronic bladder outlet obstruction. Enlarged prostate. 4. Nonobstructing right kidney stone. 5. Aortic atherosclerosis.  MRCP pending, GI has evaluated him, not a surgical candidate but they will evaluate for a possible stent placement to see if that will help.  CT head unremarkable and ammonia normal.  12/21: Vital stable, MRCP with enlarging liver lesion significant for disease progression with new metastatic disease.  Also found to have large volume ascites, likely malignant, paracentesis was done earlier today with pending labs and cytology.  Patient with significant decline, palliative care was also consulted.  Lengthy discussion with family as patient is currently full code, no POA, son is willing to step up and to complete the paperwork.  Patient is appropriate for comfort focused care only with hospice help.  Labs with worsening liver function. Pending urine culture although UA was not very impressive for infection.  Patient was also started on Zosyn for concern of intra-abdominal infection which we will continue until peritoneal fluid cultures are back  Assessment and Plan: * Acute metabolic encephalopathy CT head and ammonia levels without any significant abnormality. Slowly worsening mentation per family.  Oriented x 2. 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 -Palliative care consult -Advised patient and family to have HPOA, son is going to step up but need paperwork -Continue to monitor  Biliary obstruction due to cancer [facility]) Worsening liver enzymes, MRCP with  worsening and your lesions involving liver, biliary ducts and lymph node. -GI is on board-need to decide whether ERCP will help  or not -Palliative care consult  Other ascites Concern of malignant ascites s/p paracentesis with pending labs. Patient was started on Zosyn for concern of intra-abdominal infection. -Will continue with Zosyn until peritoneal fluid cultures are negative  History of cholangiocarcinoma Continue to have disease advancement, not a candidate for any surgical or medical intervention at this time. -Palliative care was consulted -Supportive care and symptom management with focus on comfort.   Hypoalbuminemia due to protein-calorie malnutrition [facility]) -Dietitian consult -Continue with dietary supplement  Lactic acidosis Resolved.  Macrocytic anemia B12 above 900, borderline folate. Patient with history of cholangiocarcinoma and alcohol abuse. -Folic acid supplement  Hypokalemia Improved with repletion. -Continue to monitor and replete as needed  Seizure (HCC) -Continue home Trileptal  TOBACCO ABUSE Counseling was provided. -Nicotine patch as needed   Subjective: Patient was seen and examined today.  Denies any pain.  Poor diet and slowly declining functional status.   Physical Exam: Vitals:   03/12/23 0814 03/12/23 1100 03/12/23 1105 03/12/23 1115  BP:  97/73 96/71 93/66   Pulse:      Resp:      Temp:      TempSrc:      SpO2: 96%     Weight:      Height:       General.  Frail and malnourished elderly man in no acute distress.  Scleral icterus and skin pallor Pulmonary.  Lungs clear bilaterally, normal respiratory effort. CV.  Regular rate and rhythm, no JVD, rub or murmur. Abdomen.  Soft, nontender, mildly distended, BS positive. CNS.  Alert and oriented .  No focal neurologic deficit. Extremities.  No edema, no cyanosis, pulses intact and symmetrical.   Data Reviewed: Prior data reviewed  Family Communication: Discussed with son, daughter  and DIL at bedside.  Son is willing to complete paperwork for HPOA  Disposition: Status is: Inpatient Remains inpatient appropriate because: Severity of illness  Planned Discharge Destination:  To be determined  Time spent: 50 minutes  This record has been created using Conservation officer, historic buildings. Errors have been sought and corrected,but may not always be located. Such creation errors do not reflect on the standard of care.   Author: Arnetha Courser, MD 03/12/2023 12:35 PM  For on call review [email or web address].

## 2023-03-13 DIAGNOSIS — R17 Unspecified jaundice: Secondary | ICD-10-CM | POA: Diagnosis not present

## 2023-03-13 DIAGNOSIS — Z7189 Other specified counseling: Secondary | ICD-10-CM

## 2023-03-13 DIAGNOSIS — Z515 Encounter for palliative care: Secondary | ICD-10-CM

## 2023-03-13 DIAGNOSIS — Z8509 Personal history of malignant neoplasm of other digestive organs: Secondary | ICD-10-CM | POA: Diagnosis not present

## 2023-03-13 DIAGNOSIS — G9341 Metabolic encephalopathy: Secondary | ICD-10-CM | POA: Diagnosis not present

## 2023-03-13 DIAGNOSIS — K831 Obstruction of bile duct: Secondary | ICD-10-CM | POA: Diagnosis not present

## 2023-03-13 MED ORDER — LORAZEPAM 2 MG/ML IJ SOLN
0.5000 mg | INTRAMUSCULAR | Status: DC | PRN
Start: 2023-03-13 — End: 2023-03-13

## 2023-03-13 MED ORDER — ALBUMIN HUMAN 25 % IV SOLN
25.0000 g | Freq: Four times a day (QID) | INTRAVENOUS | Status: AC
  Administered 2023-03-13: 12.5 g via INTRAVENOUS
  Administered 2023-03-13: 25 g via INTRAVENOUS
  Filled 2023-03-13 (×2): qty 100

## 2023-03-13 MED ORDER — SODIUM CHLORIDE 0.9 % IV SOLN
8.0000 mg | Freq: Three times a day (TID) | INTRAVENOUS | Status: DC
  Administered 2023-03-13 (×2): 8 mg via INTRAVENOUS
  Filled 2023-03-13 (×4): qty 4

## 2023-03-13 MED ORDER — GLYCOPYRROLATE 1 MG PO TABS: 1.0000 mg | ORAL_TABLET | ORAL | Status: DC | PRN

## 2023-03-13 MED ORDER — POLYVINYL ALCOHOL 1.4 % OP SOLN: 1.0000 [drp] | Freq: Four times a day (QID) | OPHTHALMIC | Status: DC | PRN

## 2023-03-13 MED ORDER — GLYCOPYRROLATE 0.2 MG/ML IJ SOLN: 0.2000 mg | INTRAMUSCULAR | Status: DC | PRN

## 2023-03-13 MED ORDER — MORPHINE SULFATE (PF) 2 MG/ML IV SOLN
2.0000 mg | INTRAVENOUS | Status: DC | PRN
  Administered 2023-03-13: 2 mg via INTRAVENOUS
  Filled 2023-03-13: qty 1

## 2023-03-13 MED ORDER — SODIUM CHLORIDE 0.9 % IV SOLN
12.5000 mg | Freq: Four times a day (QID) | INTRAVENOUS | Status: DC | PRN
  Administered 2023-03-13: 12.5 mg via INTRAVENOUS
  Filled 2023-03-13: qty 12.5

## 2023-03-13 MED ORDER — ENSURE ENLIVE PO LIQD: 237.0000 mL | Freq: Two times a day (BID) | ORAL | Status: DC

## 2023-03-13 MED ORDER — MIDODRINE HCL 5 MG PO TABS
10.0000 mg | ORAL_TABLET | Freq: Three times a day (TID) | ORAL | Status: DC
Start: 2023-03-13 — End: 2023-03-13
  Administered 2023-03-13: 10 mg via ORAL
  Filled 2023-03-13 (×2): qty 2

## 2023-03-13 MED ORDER — BIOTENE DRY MOUTH MT LIQD: 15.0000 mL | OROMUCOSAL | Status: DC | PRN

## 2023-03-13 MED ORDER — LORAZEPAM 2 MG/ML IJ SOLN
0.5000 mg | Freq: Three times a day (TID) | INTRAMUSCULAR | Status: DC
  Administered 2023-03-13 (×2): 0.5 mg via INTRAVENOUS
  Filled 2023-03-13 (×2): qty 1

## 2023-03-13 NOTE — Consult Note (Signed)
Palliative Medicine Inpatient Consult Note  Consulting Provider:  Arnetha Courser, MD    Reason for consult:  Palliative Care Consult ServicesPalliative Medicine ConsultReason for Consult?To discuss goals of care, patient with worsening cholangiocarcinoma and extensive liver and retroperitoneal mets.  Apparently not a candidate for any surgical or medical intervention  03/13/2023  HPI:  Per intake H&P --> Mason Blackburn is a 77 y.o. male with past medical history significant for seizure disorder failure TBI, neurogenic bladder/UTI follows [facility], tobacco/alcohol use, cholangiocarcinoma first diagnosed 2021. Identified to have worsening metastatic disease.   The Palliative care team has been asked to discuss goals of care and potentially hospice as a consideration.   Clinical Assessment/Goals of Care:  *Please note that this is a verbal dictation therefore any spelling or grammatical errors are due to the "[employer] One" system interpretation.  I have reviewed medical records including EPIC notes, labs and imaging, received report from bedside RN, assessed the patient who is lying in bed, appears to be intermittently vomiting in his trash can beside him. He is pleasantly disoriented during my time with him and not able to state back to be what we are talking about. He seems surprised when discussing his cancer. He asks that I call his family to talk about his situation further, he understands it's not good and he shares, "I feel like I'm dying".    I called patient's daughter-in-law Mason Blackburn and spoke to his son, Mason Blackburn to further discuss diagnosis prognosis, GOC, EOL wishes, disposition and options.   I introduced Palliative Medicine as specialized medical care for people living with serious illness. It focuses on providing relief from the symptoms and stress of a serious illness. The goal is to improve quality of life for both the patient and the family.  Medical  History Review and Understanding:  A formal review of Mason Blackburn's past medical history inclusive of his traumatic brain injury, neurogenic bladder, seizure disorder, and cholangiocarcinoma was complete.  Social History:  Mason Blackburn is from the [place] originally.  He has been married though is divorced.  He has a son and a daughter. He has lived in [place] for the past 20 or so years. He formally worked in [employer].  He is a man of faith practicing within Catholicism.  Functional and Nutritional State:  Preceding Mason Blackburn's hospitalization his family notes that he had begun to get progressively weaker and weaker.  They had a lot of concern that they may come to his home and find him deceased.  He was apparently caring for himself though doing that relatively poorly.  His appetite has dwindled over the months.  Advance Directives:  A detailed discussion was had today regarding advanced directives.  Patient has no advanced directives though his children are his surrogate decision makers.  Code Status:  Concepts specific to code status, artifical feeding and hydration, continued IV antibiotics and rehospitalization was had.  The difference between a aggressive medical intervention path  and a palliative comfort care path for this patient at this time was had.   Encouraged patient/family to consider DNR/DNI status understanding evidenced based poor outcomes in similar hospitalized patient, as the cause of arrest is likely associated with advanced chronic/terminal illness rather than an easily reversible acute cardio-pulmonary event. I explained that DNR/DNI does not change the medical plan and it only comes into effect after a person has arrested (died).  It is a protective measure to keep [place] from harming the patient in  their last moments of life.  Patient's children were agreeable to DNR/DNI with understanding that patient would not receive CPR, defibrillation, ACLS  medications, or intubation.   Discussion:  Patient's children understand that he has been ill for quite some time now.  They note helping to care for him over the years while he was living in a small apartment.  A discussion was held with the gastroenterology team yesterday and family does understand that his prognosis is quite limited.  They have spoken among themselves privately and determined that they would like Mason Blackburn to be as comfortable as possible as he transitions from this life to the next.  We reviewed that once transitioned to comfort the patient would no longer receive aggressive medical interventions such as continuous vital signs, lab work, radiology testing, mIVF, antibiotics or any other medications not focused on comfort.   We discussed that all care would focus on how the patient is looking and feeling. This would include management of any symptoms that may cause discomfort, pain, shortness of breath, cough, nausea, agitation, anxiety, and/or secretions etc. Symptoms would be managed with medications and other non-pharmacological intervention.   We reviewed that Mason Blackburn is quite anxious and have episodes of vomiting therefore we reviewed managing these with around the Blackburn medications.   Family verbalized understanding and appreciation.   I shared that we would reach out to local inpatient hospice homes to have Mason Blackburn evaluated for placement.   Discussed the importance of continued conversation with family and their  medical providers regarding overall plan of care and treatment options, ensuring decisions are within the context of the patients values and GOCs.  Decision Maker: Mason Blackburn (Son): [phone]  SUMMARY OF RECOMMENDATIONS   DNAR/DNI  Comfort care  Added lorazepam 0.5mg  IVP Q8H ATC and Zofran 8mg  IV Q8H ATC  Additional comfort medications per [facility]  Plan for placement at inpatient hospice home --> Appreciate TOC involvement  Ongoing Palliative  support  Code Status/Advance Care Planning: DNAR/DNI  Palliative Prophylaxis:  Aspiration, Bowel Regimen, Delirium Protocol, Frequent Pain Assessment, Oral Care, Palliative Wound Care, and Turn Reposition  Additional Recommendations (Limitations, Scope, Preferences): Full comfort care  Psycho-social/Spiritual:  Desire for further Chaplaincy support: Yes Additional Recommendations: Education on end of life care   Prognosis: Limited overall inpatient hospice is reasonable given patients active symptom burden.   Discharge Planning: Discharge plan to be determined.  Vitals:   03/13/23 0315 03/13/23 0400  BP: 90/68 95/71  Pulse: 96   Resp: 20   Temp: 98.3 F (36.8 C)   SpO2: 95%     Intake/Output Summary (Last 24 hours) at 03/13/2023 6578 Last data filed at 03/13/2023 4696 Gross per 24 hour  Intake 927.08 ml  Output 750 ml  Net 177.08 ml   Last Weight  Most recent update: 03/10/2023  9:08 PM    Weight  60 kg (132 lb 4.4 oz)            Gen:  Elderly M in moderate distress, chronically ill appearing HEENT: moist mucous membranes CV: Irregular rate and regular rhythm  PULM:  On RA, breathing is even and nonlabored ABD: Distended EXT: No edema  Neuro: Alert to self - disoriented at times  PPS: 20%   This conversation/these recommendations were discussed with patient primary care team, Dr. Nelson Chimes  Billing based on MDM: High  Problems Addressed: One acute or chronic illness or injury that poses a threat to life or bodily function  Amount and/or Complexity of Data:  Category 3:Discussion of management or test interpretation with external physician/other qualified health care professional/appropriate source (not separately reported)  Risks: Decision regarding hospitalization or escalation of hospital care and Decision not to resuscitate or to de-escalate care because of poor prognosis ______________________________________________________ Lamarr Lulas [facility]  Health Palliative Medicine Team Team Cell Phone: [phone] Please utilize secure chat with additional questions, if there is no response within 30 minutes please call the above phone number  Palliative Medicine Team providers are available by phone from 7am to 7pm daily and can be reached through the team cell phone.  Should this patient require assistance outside of these hours, please call the patient's attending physician.

## 2023-03-13 NOTE — Discharge Summary (Signed)
Physician Discharge Summary   Patient: Mason Blackburn MRN: [id number] DOB: 01/29/1946  Admit date:     03/10/2023  Discharge date: 03/13/23  Discharge Physician: Arnetha Courser   PCP: Gabriel Earing, FNP   Recommendations at discharge:  Patient is being discharged to inpatient hospice.  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 [facility] Course: Taken from H&P.  Mason Blackburn is a 77 y.o. male with past medical history significant for seizure disorder failure TBI, neurogenic bladder/UTI [facility], tobacco/alcohol use, cholangiocarcinoma first diagnosed 2021 , stage Ib, had central liver mass 5.4 cm, CT chest that time multiple bilateral solid and nonsolid pulmonary nodules indeterminate cannot rule out metastasis, follow-up CT 2022 stable lung nodules..   Due to UTI, metabolic encephalopathy and sepsis patient was poor candidate for surgery/chemotherapy so had radiation 8/11-3 8/23 2021.  Not eligible for immunotherapy. Last follow-up with Dr. Mosetta Putt was 09/2020 Hospitalization 01/5 through 0 1/8 with urinary retention right upper quadrant abdominal pain, constipation found to have right lower lobe pneumonia VQ scan negative blood culture negative, E. coli UTI treated with Rocephin.   03/25/2022 CT abdomen pelvis with contrast for abdominal pain (admitted with right upper quadrant pain, found to have UTI/urinary retention, pneumonia) showed porta hepatis mass 4 cm without definite interval change, severe bilateral hydronephrosis and hydroureter with 1.3 cm stone left UVJ, urinary bladder wall  thickening, enlarged prostate Patient admitted again the end of August into July with metabolic encephalopathy thought potentially postictal state versus alcohol intoxication/withdrawal positive alcohol during that visit.   Presents to the ER yesterday with altered mental status, significant jaundice at [facility], case was discussed with gastroenterology and they were recommending transferring to [facility] for MRCP and further evaluation.  CT abdomen and pelvis done earlier with interval diffuse biliary dilatation with development of large cystic densities throughout the liver, consistent with biliary obstruction. Poorly defined soft tissue mass at the porta hepatis consistent with given history of cholangiocarcinoma. 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 2. Moderate to large volume of abdominopelvic ascites. Gastroesophageal varices. 3. Thick-walled irregular urinary bladder with multiple diverticula consistent with chronic bladder outlet obstruction. Enlarged prostate. 4. Nonobstructing right kidney stone. 5. Aortic atherosclerosis.  MRCP pending, GI has evaluated him, not a surgical candidate but they will evaluate for a possible stent placement to see if that will help.  CT head unremarkable and ammonia normal.  12/21: Vital stable, MRCP with enlarging liver lesion significant for disease progression with new metastatic disease.  Also found to have large volume ascites, likely malignant, paracentesis was done earlier today with pending labs and cytology.  Patient with significant decline, palliative care was also consulted.  Lengthy discussion with family as patient is currently full code, no POA, son is willing to step up and to complete the paperwork.  Patient is appropriate for comfort focused care only with hospice help.  Labs with worsening liver function. Pending urine culture although UA was not very impressive for infection.  Patient was also started on Zosyn  for concern of intra-abdominal infection which we will continue until  peritoneal fluid cultures are back.  12/22: Patient with intermittent hypertension, started on midodrine after poor response to fluid boluses.  Some nausea and vomiting today. Not a candidate for any ERCP or other intervention, very limited life expectancy and poor prognosis. Peritoneal fluid culture remain negative on preliminary results.  Urine culture with insignificant growth, discontinuing antibiotics.   Does not look like patient has capacity to make decision as he cannot grasp his condition.  Patient is at terminal stages of his illness with a very limited life expectancy and grave prognosis.  Persistent nausea and vomiting despite Zofran and Phenergan.  Patient is becoming more fidgety.  Palliative care had a family meeting and he is being transitioned to full comfort care.    Patient is being discharged to inpatient hospice facility for end-of-life care.    Assessment and Plan: * Acute metabolic encephalopathy CT head and ammonia levels without any significant abnormality. Slowly worsening mentation per family.  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 -Palliative care consult-after family meeting patient is now proceed to full comfort care. 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 -GI is on board-patient is not a candidate for ERCP due to significant disease progression. Overall quickly declining and now full comfort care  Other ascites Concern of malignant ascites s/p paracentesis with pending labs. Patient was started on Zosyn for concern of intra-abdominal infection. -Will continue with Zosyn until peritoneal fluid cultures are negative  History of  cholangiocarcinoma Continue to have disease advancement, not a candidate for any surgical or medical intervention at this time. -Palliative care was consulted -Supportive care and symptom management with focus on comfort.   Hypoalbuminemia due to protein-calorie malnutrition [facility]) -Dietitian consult -Continue with dietary supplement  Lactic acidosis Resolved.  Macrocytic anemia B12 above 900, borderline folate. Patient with history of cholangiocarcinoma and alcohol abuse. -Folic acid supplement  Hypokalemia Improved with repletion. -Continue to monitor and replete as needed  Seizure (HCC) -Continue home Trileptal  TOBACCO ABUSE Counseling was provided. -Nicotine patch as needed   Consultants: Gastroenterology.  Palliative care Procedures performed: None Disposition: Hospice care Diet recommendation:  Discharge Diet Orders (From admission, onward)     Start     Ordered   03/13/23 0000  Diet - low sodium heart healthy        03/13/23 1343           Regular diet DISCHARGE MEDICATION: Allergies as of 03/13/2023   No Known Allergies      Medication List     STOP taking these medications    cholecalciferol 10 MCG/ML Liqd oral liquid Commonly known as: VITAMIN D3   Klor-Con M10 10 MEQ tablet Generic drug: potassium chloride   OXcarbazepine 300 MG/5ML suspension Commonly known as: TRILEPTAL   silodosin 8 MG Caps capsule Commonly known as: RAPAFLO       TAKE these medications    feeding supplement Liqd Take 237 mLs by mouth 2 (two) times daily between meals.   finasteride 5 MG tablet Commonly known as: PROSCAR Take 5 mg by mouth daily.        Discharge Exam: Filed Weights   03/10/23 2107  Weight: 60 kg   General.  Frail, malnourished, little fidgety elderly man, appears pale with scleral icterus Pulmonary.  Lungs clear bilaterally, normal respiratory effort. CV.  Regular rate and  rhythm, no JVD, rub or murmur. Abdomen.  Soft,  nontender, nondistended, BS positive. CNS.  Lethargic, no apparent deficit Extremities.  No edema, no cyanosis, pulses intact and symmetrical.   Condition at discharge: stable  The results of significant diagnostics from this hospitalization (including imaging, microbiology, ancillary and laboratory) are listed below for reference.   Imaging Studies: US Paracentesis Result Date: 03/12/2023 INDICATION: Hepatic mass with ascites. Request for diagnostic and therapeutic paracentesis. EXAM: ULTRASOUND GUIDED PARACENTESIS MEDICATIONS: 1% lidocaine 10 mL COMPLICATIONS: None immediate. PROCEDURE: Informed written consent was obtained from the patient after a discussion of the risks, benefits and alternatives to treatment. A timeout was performed prior to the initiation of the procedure. Initial ultrasound scanning demonstrates a moderate amount of ascites within the right lateral abdomen. The right lateral abdomen was prepped and draped in the usual sterile fashion. 1% lidocaine was used for local anesthesia. Following this, a 19 gauge, 7-cm, Yueh catheter was introduced. An ultrasound image was saved for documentation purposes. The paracentesis was performed. The catheter was removed and a dressing was applied. The patient tolerated the procedure well without immediate post procedural complication. FINDINGS: A total of approximately 1 L of clear yellow fluid was removed. Samples were sent to the laboratory as requested by the clinical team. IMPRESSION: Successful ultrasound-guided paracentesis yielding 1 liter of peritoneal fluid. Procedure performed by: Corrin Parker, PA-C Electronically Signed   By: Acquanetta Belling M.D.   On: 03/12/2023 12:02   MR ABDOMEN MRCP W WO CONTAST Result Date: 03/11/2023 CLINICAL DATA:  Jaundice, cholangiocarcinoma EXAM: MRI ABDOMEN WITHOUT AND WITH CONTRAST (INCLUDING MRCP) TECHNIQUE: Multiplanar multisequence MR imaging of the abdomen was performed both before and after the  administration of intravenous contrast. Heavily T2-weighted images of the biliary and pancreatic ducts were obtained, and three-dimensional MRCP images were rendered by post processing. CONTRAST:  6mL GADAVIST GADOBUTROL 1 MMOL/ML IV SOLN COMPARISON:  CT abdomen pelvis, 03/10/2023, MR abdomen, 08/21/2019 FINDINGS: Examination limited by breath motion artifact throughout. Lower chest: Small hiatal hernia.  Small bilateral pleural effusions Hepatobiliary: Multiple large heterogeneously enhancing mixed solid and cystic liver lesions varying degree of solid character, nearly confluent, largest in the anterior right lobe of the liver measuring 6.8 x 6.4 cm (series 3, image 22). Severe atrophy of the left lobe of the liver, with severe intrahepatic biliary ductal dilatation an ill-defined tissue throughout the hepatic hilum (series 3, image 16). The central common hepatic duct appears strictured (series 2, image 21). Small gallstone near the ampulla measuring no greater than 0.3 cm (series 4, image 23). Pancreas: Unremarkable. No pancreatic ductal dilatation or surrounding inflammatory changes. Spleen: Normal in size without significant abnormality. Adrenals/Urinary Tract: Adrenal glands are unremarkable. Kidneys are normal, without renal calculi, solid lesion, or hydronephrosis. Stomach/Bowel: Stomach is within normal limits. Diffuse small bowel thickening (series 2, image 20). Vascular/Lymphatic: Aortic atherosclerosis. Portal vein is effaced within the porta hepatis by mass. Numerous retroperitoneal varices. No enlarged abdominal lymph nodes. Other: No abdominal wall hernia or abnormality.  Moderate volume. Musculoskeletal: No acute or significant osseous findings. IMPRESSION: 1. Examination limited by breath motion artifact throughout. 2. Multiple large heterogeneously enhancing mixed solid and cystic liver lesions varying degree of solid character, nearly confluent, largest in the anterior right lobe of the liver  measuring 6.8 x 6.4 cm. Findings are consistent with extensive hepatic metastatic disease. Notably, these lesions are new compared to examination dated 03/25/2022. 3. Severe atrophy of the left lobe of the liver, with severe intrahepatic biliary ductal dilatation  and ill-defined tissue throughout the hepatic hilum, consistent with cholangiocarcinoma. The central common hepatic duct appears strictured. 4. Portal vein is effaced within the porta hepatis by mass. Numerous retroperitoneal varices. 5. Diffuse small bowel thickening, nonspecific in the setting of ascites and portal obstruction, possibly reflecting portal enteropathy or nonspecific enteritis. 6. Small bilateral pleural effusions. Aortic Atherosclerosis (ICD10-I70.0). Electronically Signed   By: Jearld Lesch M.D.   On: 03/11/2023 18:39   MR 3D Recon At Scanner Result Date: 03/11/2023 CLINICAL DATA:  Jaundice, cholangiocarcinoma EXAM: MRI ABDOMEN WITHOUT AND WITH CONTRAST (INCLUDING MRCP) TECHNIQUE: Multiplanar multisequence MR imaging of the abdomen was performed both before and after the administration of intravenous contrast. Heavily T2-weighted images of the biliary and pancreatic ducts were obtained, and three-dimensional MRCP images were rendered by post processing. CONTRAST:  6mL GADAVIST GADOBUTROL 1 MMOL/ML IV SOLN COMPARISON:  CT abdomen pelvis, 03/10/2023, MR abdomen, 08/21/2019 FINDINGS: Examination limited by breath motion artifact throughout. Lower chest: Small hiatal hernia.  Small bilateral pleural effusions Hepatobiliary: Multiple large heterogeneously enhancing mixed solid and cystic liver lesions varying degree of solid character, nearly confluent, largest in the anterior right lobe of the liver measuring 6.8 x 6.4 cm (series 3, image 22). Severe atrophy of the left lobe of the liver, with severe intrahepatic biliary ductal dilatation an ill-defined tissue throughout the hepatic hilum (series 3, image 16). The central common hepatic  duct appears strictured (series 2, image 21). Small gallstone near the ampulla measuring no greater than 0.3 cm (series 4, image 23). Pancreas: Unremarkable. No pancreatic ductal dilatation or surrounding inflammatory changes. Spleen: Normal in size without significant abnormality. Adrenals/Urinary Tract: Adrenal glands are unremarkable. Kidneys are normal, without renal calculi, solid lesion, or hydronephrosis. Stomach/Bowel: Stomach is within normal limits. Diffuse small bowel thickening (series 2, image 20). Vascular/Lymphatic: Aortic atherosclerosis. Portal vein is effaced within the porta hepatis by mass. Numerous retroperitoneal varices. No enlarged abdominal lymph nodes. Other: No abdominal wall hernia or abnormality.  Moderate volume. Musculoskeletal: No acute or significant osseous findings. IMPRESSION: 1. Examination limited by breath motion artifact throughout. 2. Multiple large heterogeneously enhancing mixed solid and cystic liver lesions varying degree of solid character, nearly confluent, largest in the anterior right lobe of the liver measuring 6.8 x 6.4 cm. Findings are consistent with extensive hepatic metastatic disease. Notably, these lesions are new compared to examination dated 03/25/2022. 3. Severe atrophy of the left lobe of the liver, with severe intrahepatic biliary ductal dilatation and ill-defined tissue throughout the hepatic hilum, consistent with cholangiocarcinoma. The central common hepatic duct appears strictured. 4. Portal vein is effaced within the porta hepatis by mass. Numerous retroperitoneal varices. 5. Diffuse small bowel thickening, nonspecific in the setting of ascites and portal obstruction, possibly reflecting portal enteropathy or nonspecific enteritis. 6. Small bilateral pleural effusions. Aortic Atherosclerosis (ICD10-I70.0). Electronically Signed   By: Jearld Lesch M.D.   On: 03/11/2023 18:39   CT ABDOMEN PELVIS W CONTRAST Result Date: 03/11/2023 CLINICAL DATA:   Jaundice possible seizure history cholangiocarcinoma EXAM: CT ABDOMEN AND PELVIS WITH CONTRAST TECHNIQUE: Multidetector CT imaging of the abdomen and pelvis was performed using the standard protocol following bolus administration of intravenous contrast. RADIATION DOSE REDUCTION: This exam was performed according to the departmental dose-optimization program which includes automated exposure control, adjustment of the mA and/or kV according to patient size and/or use of iterative reconstruction technique. CONTRAST:  OMNIPAQUE IOHEXOL 300 MG/ML  SOLN COMPARISON:  CT 03/25/2022 FINDINGS: Lower chest: Lung bases demonstrate emphysema. No acute airspace  disease. Small hiatal hernia. Circumferential distal esophageal thickening. Hepatobiliary: Interval diffuse biliary dilatation with development of large cystic densities throughout the liver, on the right measuring 9.6 by 6.3 cm and on the left measuring 7 x 3.7 cm. Poorly defined soft tissue mass at the porta hepatis, measures approximately 5.3 x 3.1 cm on coronal series 5, image 47. Dilated common bile duct up to 11 mm. Pancreas: No inflammation.  Slight prominence of the proximal duct Spleen: Within normal limits for size Adrenals/Urinary Tract: Adrenal glands are normal. Kidneys show no hydronephrosis. Cyst midpole left kidney. Cortical scarring right kidney. Mild right renal atrophy. Nonobstructing right kidney stone. Thick-walled irregular urinary bladder with multiple diverticula. Stomach/Bowel: Stomach nonenlarged. No dilated small bowel. No acute bowel wall thickening. Vascular/Lymphatic: Moderate aortic atherosclerosis. No aneurysm. No suspicious lymph nodes. 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 Reproductive: Enlarged prostate Other: No free air. Moderate to large volume of abdominopelvic ascites. Musculoskeletal: No acute or suspicious osseous abnormality. IMPRESSION: 1. Interval diffuse biliary dilatation  with development of large cystic densities throughout the liver, consistent with biliary obstruction. Poorly defined soft tissue mass at the porta hepatis consistent with given history of cholangiocarcinoma. 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 2. Moderate to large volume of abdominopelvic ascites. Gastroesophageal varices. 3. Thick-walled irregular urinary bladder with multiple diverticula consistent with chronic bladder outlet obstruction. Enlarged prostate. 4. Nonobstructing right kidney stone. 5. Aortic atherosclerosis. Aortic Atherosclerosis (ICD10-I70.0) and Emphysema (ICD10-J43.9). Electronically Signed   By: Jasmine Pang M.D.   On: 03/11/2023 00:27   CT Head Wo Contrast Result Date: 03/10/2023 CLINICAL DATA:  Altered mental status EXAM: CT HEAD WITHOUT CONTRAST TECHNIQUE: Contiguous axial images were obtained from the base of the skull through the vertex without intravenous contrast. RADIATION DOSE REDUCTION: This exam was performed according to the departmental dose-optimization program which includes automated exposure control, adjustment of the mA and/or kV according to patient size and/or use of iterative reconstruction technique. COMPARISON:  05/07/2006 FINDINGS: Brain: No mass, hemorrhage or extra-axial collection. There is hypoattenuation of the white matter. CSF spaces are normal. Vascular: Negative Skull: Negative Sinuses/Orbits: Paranasal sinuses are clear. No mastoid effusion. Normal orbits. Other: None. IMPRESSION: 1. No acute intracranial abnormality. 2. Findings of chronic small vessel ischemia. Electronically Signed   By: Deatra Robinson M.D.   On: 03/10/2023 23:46   DG Chest Port 1 View Result Date: 03/10/2023 CLINICAL DATA:  Seizure jaundiced EXAM: PORTABLE CHEST 1 VIEW COMPARISON:  08/17/2019 FINDINGS: Stable cardiomediastinal silhouette. Aortic atherosclerotic calcification. Elevated right hemidiaphragm. Bibasilar atelectasis. No focal consolidation, pleural  effusion, or pneumothorax. No displaced rib fractures. IMPRESSION: No active disease. Electronically Signed   By: Minerva Fester M.D.   On: 03/10/2023 23:36    Microbiology: Results for orders placed or performed during the hospital encounter of 03/10/23  Culture, blood (Routine X 2) w Reflex to ID Panel     Status: None (Preliminary result)   Collection Time: 03/10/23 10:05 PM   Specimen: BLOOD  Result Value Ref Range Status   Specimen Description BLOOD RIGHT ANTECUBITAL  Final   Special Requests   Final    BOTTLES DRAWN AEROBIC AND ANAEROBIC Blood Culture adequate volume   Culture   Final    NO GROWTH 3 DAYS Performed at [facility], [address], [place], [place] [address]    Report Status PENDING  Incomplete  Culture, blood (Routine X 2) w Reflex to ID Panel  Status: None (Preliminary result)   Collection Time: 03/10/23 10:05 PM   Specimen: BLOOD  Result Value Ref Range Status   Specimen Description BLOOD BLOOD RIGHT HAND  Final   Special Requests   Final    BOTTLES DRAWN AEROBIC ONLY Blood Culture adequate volume   Culture   Final    NO GROWTH 3 DAYS Performed at [facility], [address], [place], [place] [address]    Report Status PENDING  Incomplete  Urine Culture (for pregnant, neutropenic or urologic patients or patients with an indwelling urinary catheter)     Status: Abnormal   Collection Time: 03/11/23 10:03 AM   Specimen: Urine, Catheterized  Result Value Ref Range Status   Specimen Description   Final    URINE, CATHETERIZED Performed at [facility], [address], [place], [place] [address]    Special Requests   Final    NONE Performed at [facility], [address], [place], [place] [address]    Culture (A)  Final    <10,000 COLONIES/mL INSIGNIFICANT GROWTH Performed at [facility], [address], [place], [place] [address]    Report Status 03/12/2023 FINAL  Final  MRSA Next Gen by PCR, Nasal     Status: None   Collection Time: 03/11/23   3:09 PM   Specimen: Nasal Mucosa; Nasal Swab  Result Value Ref Range Status   MRSA by PCR Next Gen NOT DETECTED NOT DETECTED Final    Comment: (NOTE) The GeneXpert MRSA Assay (FDA approved for NASAL specimens only), is one component of a comprehensive MRSA colonization surveillance program. It is not intended to diagnose MRSA infection nor to guide or monitor treatment for MRSA infections. Test performance is not FDA approved in patients less than 73 years old. Performed at [facility], [address], [place], [place] [address]   Culture, body fluid w Gram Stain-bottle     Status: None (Preliminary result)   Collection Time: 03/12/23 11:30 AM   Specimen: Peritoneal Washings  Result Value Ref Range Status   Specimen Description PERITONEAL  Final   Special Requests NONE  Final   Culture   Final    NO GROWTH < 24 HOURS Performed at [facility], [address], [place], [place] [address]    Report Status PENDING  Incomplete  Gram stain     Status: None   Collection Time: 03/12/23 11:30 AM   Specimen: Peritoneal Washings  Result Value Ref Range Status   Specimen Description PERITONEAL  Final   Special Requests NONE  Final   Gram Stain   Final    WBC PRESENT, PREDOMINANTLY PMN NO ORGANISMS SEEN CYTOSPIN SMEAR Performed at [facility], [address], [place], [place] [address]    Report Status 03/12/2023 FINAL  Final    Labs: CBC: Recent Labs  Lab 03/10/23 2134 03/11/23 0830 03/12/23 0422  WBC 13.0* 17.1* 11.3*  NEUTROABS 10.6* 13.8*  --   HGB 11.3* 9.5* 9.8*  HCT 32.7* 27.9* 28.9*  MCV 108.6* 109.8* 108.6*  PLT 352 287 276   Basic Metabolic Panel: Recent Labs  Lab 03/10/23 2134 03/11/23 0830 03/12/23 0422  NA 137 138 136  K 3.2* 3.6 3.6  CL 105 109 115*  CO2 23 20* 18*  GLUCOSE 117* 93 120*  BUN 18 17 23   CREATININE 1.21 1.16 1.32*  CALCIUM 8.9 7.9* 7.9*  MG  --  1.9 2.1  PHOS  --   --  3.6   Liver Function Tests: Recent Labs   Lab 03/10/23 2134 03/11/23 0830 03/12/23 0422  AST 71* 78* 101*  ALT 46* 42 52*  ALKPHOS 833* 667* 668*  BILITOT 9.7* 8.3* 8.0*  PROT 6.8 5.4* 5.8*  ALBUMIN 2.2* 1.7* 1.9*   CBG: Recent Labs  Lab 03/10/23 2107  GLUCAP 110*    Discharge time spent: greater than 30 minutes.  This record has been created using Conservation officer, historic buildings. Errors have been sought and corrected,but may not always be located. Such creation errors do not reflect on the standard of care.   Signed: Arnetha Courser, MD [facility] 03/13/2023

## 2023-03-13 NOTE — Plan of Care (Signed)
  Problem: Education: Goal: Knowledge of General Education information will improve Description: Including pain rating scale, medication(s)/side effects and non-pharmacologic comfort measures Outcome: Progressing   Problem: Clinical Measurements: Goal: Respiratory complications will improve Outcome: Progressing Goal: Cardiovascular complication will be avoided Outcome: Progressing   Problem: Activity: Goal: Risk for activity intolerance will decrease Outcome: Progressing   Problem: Coping: Goal: Level of anxiety will decrease Outcome: Progressing   Problem: Pain Management: Goal: General experience of comfort will improve Outcome: Progressing   Problem: Safety: Goal: Ability to remain free from injury will improve Outcome: Progressing   Problem: Skin Integrity: Goal: Risk for impaired skin integrity will decrease Outcome: Progressing

## 2023-03-13 NOTE — Assessment & Plan Note (Signed)
Worsening liver enzymes, MRCP with worsening and your lesions involving liver, biliary ducts and lymph node. -GI is on board-patient is not a candidate for ERCP due to significant disease progression. Overall quickly declining and now full comfort care

## 2023-03-13 NOTE — TOC Transition Note (Signed)
Transition of Care [facility]) - Discharge Note   Patient Details  Name: Milton Lanier MRN: [id number] Date of Birth: Nov 11, 1945  Transition of Care [facility]) CM/SW Contact:  Deatra Robinson, [place] Phone Number: 03/13/2023, 2:22 PM   Clinical Narrative: pt has been [facility]. Per Lanora Manis with [facility], pt's family has completed consents and are agreeable to dc plan. RN provided with number for report and PTAR arranged for transport. SW signing off at dc.   Dellie Burns, MSW, LCSW [phone] (coverage)        Final next level of care: Hospice Medical Facility Barriers to Discharge: Barriers Resolved   Patient Goals and CMS Choice            Discharge Placement                Patient to be transferred to facility by: PTAR Name of family member notified: Kristin/DIL Patient and family notified of of transfer: 03/13/23  Discharge Plan and Services Additional resources added to the After Visit Summary for                                       Social Drivers of Health (SDOH) Interventions SDOH Screenings   Food Insecurity: Food Insecurity Present (03/11/2023)  Housing: High Risk (03/11/2023)  Transportation Needs: No Transportation Needs (03/11/2023)  Utilities: Not At Risk (03/11/2023)  Depression (PHQ2-9): Low Risk  (10/20/2022)  Tobacco Use: High Risk (03/10/2023)     Readmission Risk Interventions     No data to display

## 2023-03-13 NOTE — TOC Progression Note (Signed)
Transition of Care [facility]) - Progression Note    Patient Details  Name: Mason Blackburn MRN: [id number] Date of Birth: Mar 12, 1946  Transition of Care [facility]) CM/SW Contact  Dellie Burns [place], [place] Phone Number: 03/13/2023, 11:18 AM  Clinical Narrative:  per Palliative Medicine APP, pt's family agreeable to comfort care and hospice home placement, no preferred facility indicated. Referral made to Shawn with [facility]) and Elizabeth with [facility] and [facility] to eval pt for hospice home admission and will update team. SW will follow.   Dellie Burns, MSW, LCSW [phone] (coverage)        Expected Discharge Plan: Home/Self Care Barriers to Discharge: Continued Medical Work up  Expected Discharge Plan and Services                                               Social Determinants of Health (SDOH) Interventions SDOH Screenings   Food Insecurity: Food Insecurity Present (03/11/2023)  Housing: High Risk (03/11/2023)  Transportation Needs: No Transportation Needs (03/11/2023)  Utilities: Not At Risk (03/11/2023)  Depression (PHQ2-9): Low Risk  (10/20/2022)  Tobacco Use: High Risk (03/10/2023)    Readmission Risk Interventions     No data to display

## 2023-03-13 NOTE — Progress Notes (Signed)
Patient 2 PIVs and foley catheter were left in per request from [facility] nurse. Patient discharge paperwork including signed DNR form and PTAR paperwork were included. Patient safely transferred to the stretcher and was taken away by PTAR.

## 2023-03-13 NOTE — Assessment & Plan Note (Signed)
CT head and ammonia levels without any significant abnormality. Slowly worsening mentation per family.  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 -Palliative care consult-after family meeting patient is now proceed to full comfort care. -Awaiting hospice facility evaluation -End-of-life comfort care measures started -Continue to monitor

## 2023-03-15 LAB — CULTURE, BLOOD (ROUTINE X 2)
Culture: NO GROWTH
Culture: NO GROWTH
Special Requests: ADEQUATE
Special Requests: ADEQUATE

## 2023-03-17 LAB — CULTURE, BODY FLUID W GRAM STAIN -BOTTLE: Culture: NO GROWTH

## 2023-03-23 DEATH — deceased
